# Patient Record
Sex: Female | Born: 1990 | Race: White | Hispanic: No | Marital: Married | Smoking: Current some day smoker
Health system: Southern US, Community
[De-identification: ages and names within clinical notes are randomized; demographics above are authoritative.]

## PROBLEM LIST (undated history)

## (undated) ENCOUNTER — Inpatient Hospital Stay (HOSPITAL_COMMUNITY): Payer: Self-pay

## (undated) DIAGNOSIS — F209 Schizophrenia, unspecified: Secondary | ICD-10-CM

## (undated) DIAGNOSIS — F32A Depression, unspecified: Secondary | ICD-10-CM

## (undated) DIAGNOSIS — E669 Obesity, unspecified: Secondary | ICD-10-CM

## (undated) DIAGNOSIS — F329 Major depressive disorder, single episode, unspecified: Secondary | ICD-10-CM

## (undated) DIAGNOSIS — B373 Candidiasis of vulva and vagina: Secondary | ICD-10-CM

## (undated) DIAGNOSIS — I10 Essential (primary) hypertension: Secondary | ICD-10-CM

## (undated) DIAGNOSIS — F419 Anxiety disorder, unspecified: Secondary | ICD-10-CM

## (undated) DIAGNOSIS — O24419 Gestational diabetes mellitus in pregnancy, unspecified control: Secondary | ICD-10-CM

## (undated) HISTORY — DX: Essential (primary) hypertension: I10

## (undated) HISTORY — DX: Depression, unspecified: F32.A

## (undated) HISTORY — DX: Anxiety disorder, unspecified: F41.9

## (undated) HISTORY — DX: Obesity, unspecified: E66.9

## (undated) HISTORY — PX: TUBAL LIGATION: SHX77

## (undated) HISTORY — DX: Major depressive disorder, single episode, unspecified: F32.9

## (undated) HISTORY — DX: Schizophrenia, unspecified: F20.9

## (undated) HISTORY — PX: WISDOM TOOTH EXTRACTION: SHX21

---
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
## 2016-09-09 ENCOUNTER — Ambulatory Visit (INDEPENDENT_AMBULATORY_CARE_PROVIDER_SITE_OTHER): Payer: Medicaid Other | Admitting: Psychiatry

## 2016-09-09 ENCOUNTER — Encounter (HOSPITAL_COMMUNITY): Payer: Self-pay | Admitting: Psychiatry

## 2016-09-09 VITALS — BP 128/80 | HR 88 | Ht 63.0 in | Wt 255.0 lb

## 2016-09-09 DIAGNOSIS — Z87891 Personal history of nicotine dependence: Secondary | ICD-10-CM | POA: Diagnosis not present

## 2016-09-09 DIAGNOSIS — F411 Generalized anxiety disorder: Secondary | ICD-10-CM

## 2016-09-09 DIAGNOSIS — Z79899 Other long term (current) drug therapy: Secondary | ICD-10-CM

## 2016-09-09 DIAGNOSIS — Z818 Family history of other mental and behavioral disorders: Secondary | ICD-10-CM | POA: Diagnosis not present

## 2016-09-09 DIAGNOSIS — F200 Paranoid schizophrenia: Secondary | ICD-10-CM

## 2016-09-09 MED ORDER — ARIPIPRAZOLE 10 MG PO TABS: 10.0000 mg | ORAL_TABLET | Freq: Every day | ORAL | 2 refills | Status: DC

## 2016-09-09 MED ORDER — CLONAZEPAM 0.5 MG PO TABS: 0.5000 mg | ORAL_TABLET | Freq: Every day | ORAL | 0 refills | Status: DC
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
## 2016-09-15 ENCOUNTER — Telehealth (HOSPITAL_COMMUNITY): Payer: Self-pay | Admitting: *Deleted

## 2016-09-15 NOTE — Telephone Encounter (Signed)
Prior authorization for Aripiprazole received. Called [place] [facility] gave approval #[id number] until 09/10/17. Called to notify pharmacy.

## 2016-09-20 ENCOUNTER — Encounter (HOSPITAL_COMMUNITY): Payer: Self-pay | Admitting: Psychiatry

## 2016-09-20 ENCOUNTER — Telehealth (HOSPITAL_COMMUNITY): Payer: Self-pay | Admitting: Psychiatry

## 2016-09-20 ENCOUNTER — Ambulatory Visit (INDEPENDENT_AMBULATORY_CARE_PROVIDER_SITE_OTHER): Payer: Medicaid Other | Admitting: Psychiatry

## 2016-09-20 VITALS — BP 126/78 | HR 106 | Resp 18 | Ht 63.0 in | Wt 262.0 lb

## 2016-09-20 DIAGNOSIS — F200 Paranoid schizophrenia: Secondary | ICD-10-CM | POA: Diagnosis not present

## 2016-09-20 DIAGNOSIS — Z880 Allergy status to penicillin: Secondary | ICD-10-CM | POA: Diagnosis not present

## 2016-09-20 DIAGNOSIS — F411 Generalized anxiety disorder: Secondary | ICD-10-CM | POA: Diagnosis not present

## 2016-09-20 DIAGNOSIS — Z87891 Personal history of nicotine dependence: Secondary | ICD-10-CM

## 2016-09-20 DIAGNOSIS — Z888 Allergy status to other drugs, medicaments and biological substances status: Secondary | ICD-10-CM

## 2016-09-20 MED ORDER — RISPERIDONE 2 MG PO TABS: 2.0000 mg | ORAL_TABLET | Freq: Two times a day (BID) | ORAL | 0 refills | Status: DC

## 2016-09-20 MED ORDER — CLONAZEPAM 0.5 MG PO TABS: 0.5000 mg | ORAL_TABLET | Freq: Every day | ORAL | 0 refills | Status: DC

## 2016-09-20 NOTE — Telephone Encounter (Signed)
[facility] called and stated that patient just got a rx for klonopin on 10/27. Patient states she had to surrender her medications at [facility] when she was admitted.   We gave patient a rx today.  Verified with Gilmore Larochekhtar, it is ok to fill rx. He is aware.   Nothing further needed at this time.
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
## 2016-09-27 ENCOUNTER — Ambulatory Visit (HOSPITAL_COMMUNITY): Payer: Medicaid Other | Admitting: Psychiatry

## 2016-10-03 ENCOUNTER — Encounter (HOSPITAL_COMMUNITY): Payer: Self-pay | Admitting: Psychiatry

## 2016-10-03 ENCOUNTER — Ambulatory Visit (INDEPENDENT_AMBULATORY_CARE_PROVIDER_SITE_OTHER): Payer: Medicaid Other | Admitting: Psychiatry

## 2016-10-03 VITALS — BP 128/80 | Resp 16 | Ht 63.0 in | Wt 272.0 lb

## 2016-10-03 DIAGNOSIS — F4310 Post-traumatic stress disorder, unspecified: Secondary | ICD-10-CM | POA: Diagnosis not present

## 2016-10-03 DIAGNOSIS — Z87891 Personal history of nicotine dependence: Secondary | ICD-10-CM | POA: Diagnosis not present

## 2016-10-03 DIAGNOSIS — F200 Paranoid schizophrenia: Secondary | ICD-10-CM

## 2016-10-03 DIAGNOSIS — Z79899 Other long term (current) drug therapy: Secondary | ICD-10-CM

## 2016-10-03 DIAGNOSIS — Z888 Allergy status to other drugs, medicaments and biological substances status: Secondary | ICD-10-CM

## 2016-10-03 DIAGNOSIS — F411 Generalized anxiety disorder: Secondary | ICD-10-CM

## 2016-10-03 DIAGNOSIS — Z880 Allergy status to penicillin: Secondary | ICD-10-CM

## 2016-10-03 DIAGNOSIS — Z818 Family history of other mental and behavioral disorders: Secondary | ICD-10-CM

## 2016-10-03 MED ORDER — RISPERIDONE 2 MG PO TABS: 2.0000 mg | ORAL_TABLET | Freq: Two times a day (BID) | ORAL | 0 refills | Status: DC

## 2016-10-03 MED ORDER — CLONAZEPAM 0.5 MG PO TABS: 0.5000 mg | ORAL_TABLET | Freq: Every day | ORAL | 0 refills | Status: DC
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
## 2016-10-05 ENCOUNTER — Ambulatory Visit: Payer: Medicaid Other | Admitting: Osteopathic Medicine

## 2016-10-18 ENCOUNTER — Telehealth (HOSPITAL_COMMUNITY): Payer: Self-pay | Admitting: *Deleted

## 2016-10-18 NOTE — Telephone Encounter (Signed)
Prior authorization for Risperidone received. Called [place] tracks spoke with [facility] who gave approval [id number] until 10/13/17. Called to notify pharmacy.

## 2016-10-24 DIAGNOSIS — F411 Generalized anxiety disorder: Secondary | ICD-10-CM | POA: Insufficient documentation

## 2016-10-24 DIAGNOSIS — F17200 Nicotine dependence, unspecified, uncomplicated: Secondary | ICD-10-CM | POA: Insufficient documentation

## 2016-10-24 DIAGNOSIS — Z8781 Personal history of (healed) traumatic fracture: Secondary | ICD-10-CM | POA: Insufficient documentation

## 2016-10-26 ENCOUNTER — Encounter (HOSPITAL_COMMUNITY): Payer: Self-pay | Admitting: Psychiatry

## 2016-10-26 ENCOUNTER — Ambulatory Visit (INDEPENDENT_AMBULATORY_CARE_PROVIDER_SITE_OTHER): Payer: Medicaid Other | Admitting: Psychiatry

## 2016-10-26 VITALS — BP 128/76 | HR 96 | Resp 16 | Ht 63.0 in | Wt 278.0 lb

## 2016-10-26 DIAGNOSIS — Z79899 Other long term (current) drug therapy: Secondary | ICD-10-CM

## 2016-10-26 DIAGNOSIS — Z818 Family history of other mental and behavioral disorders: Secondary | ICD-10-CM | POA: Diagnosis not present

## 2016-10-26 DIAGNOSIS — F200 Paranoid schizophrenia: Secondary | ICD-10-CM

## 2016-10-26 DIAGNOSIS — Z888 Allergy status to other drugs, medicaments and biological substances status: Secondary | ICD-10-CM

## 2016-10-26 DIAGNOSIS — Z87891 Personal history of nicotine dependence: Secondary | ICD-10-CM

## 2016-10-26 DIAGNOSIS — Z880 Allergy status to penicillin: Secondary | ICD-10-CM | POA: Diagnosis not present

## 2016-10-26 MED ORDER — RISPERIDONE 2 MG PO TABS: 2.0000 mg | ORAL_TABLET | Freq: Two times a day (BID) | ORAL | 0 refills | Status: DC

## 2016-10-26 NOTE — Progress Notes (Signed)
[facility]  Patient Identification: Anna HitchBrandi Jordan MRN:  [id number] Date of Evaluation:  10/26/2016 Referral Source: Self / Family Chief Complaint:   Chief Complaint    Follow-up     Visit Diagnosis:    ICD-9-CM ICD-10-CM   1. Paranoid schizophrenia (HCC) 295.30 F20.0 Prolactin   GAD Insomnia   History of Present Illness:  25 years old currently single Caucasian female who is living with her boyfriend is here for follow up after recent [facility].  Her schizophrenia is reasonably controlled and was told she still feels guarded does not want to be around people but overall less paranoid does not endorse hearing voices She is experiencing milk from her nipples the galactorrhea we will get a prolactin level but she does not want to stop Risperdal because she feels it is really helping  GAD; she remains anxious around people overall less compared to last time Insomnia; Klonopin also at night she takes half a dose as below pulse at night.    Modifying factor is support from boyfriend aggravating factors to be around people or crowds    Aggravating factor: being around people. Miscarriage 2017   Previous Psychotropic Medications: Yes     Past Medical History: History reviewed. No pertinent past medical history. History reviewed. No pertinent surgical history.    Family History:  Family History  Problem Relation Age of Onset  . Depression Mother     Social History:   Social History   Social History  . Marital status: Single    Spouse name: N/A  . Number of children: N/A  . Years of education: N/A   Social History Main Topics  . Smoking status: Former Smoker    Quit date: 08/29/2016  . Smokeless tobacco: Never Used  . Alcohol use No  . Drug use: No  . Sexual activity: Yes    Partners: Male   Other Topics Concern  . None   Social History Narrative  . None     Allergies:   Allergies  Allergen Reactions  .  Hydrocodone Hives  . Morphine Hives  . Tramadol Hives  . Penicillin G Rash    Metabolic Disorder Labs: No results found for: HGBA1C, MPG No results found for: PROLACTIN No results found for: CHOL, TRIG, HDL, CHOLHDL, VLDL, LDLCALC   Current Medications: Current Outpatient Prescriptions  Medication Sig Dispense Refill  . clonazePAM (KLONOPIN) 0.5 MG tablet Take 1 tablet (0.5 mg total) by mouth at bedtime. 30 tablet 0  . nabumetone (RELAFEN) 500 MG tablet Take by mouth.    . risperiDONE (RISPERDAL) 2 MG tablet Take 1 tablet (2 mg total) by mouth 2 (two) times daily. 60 tablet 0   No current facility-administered medications for this visit.       Psychiatric Specialty Exam: Review of Systems  Respiratory: Negative for cough.   Cardiovascular: Negative for chest pain.  Gastrointestinal: Negative for nausea.  Skin: Negative for rash.  Psychiatric/Behavioral: Negative for suicidal ideas. The patient is nervous/anxious.     Blood pressure 128/76, pulse 96, resp. rate 16, height 5\' 3"  (1.6 m), weight 278 lb (126.1 kg), SpO2 94 %.Body mass index is 49.25 kg/m.  General Appearance: Casual  Eye Contact:  Minimal  Speech:  Slow  Volume:  Decreased  Mood:  Less guarded  Affect:  Congruent  Thought Process:  Relavant. Not hallucinating  Orientation:  Full (Time, Place, and Person)  Thought Content:  Denies signifianct paranoia  Suicidal Thoughts:  No  Homicidal Thoughts:  No  Memory:  Immediate;   Fair Recent;   Fair  Judgement:  Poor  Insight:  Shallow  Psychomotor Activity:  Decreased  Concentration:  Concentration: Fair and Attention Span: Fair  Recall:  Fair  Fund of KnowledgeFiserv:Fair  Language: Fair  Akathisia:  Negative  Handed:  Right  AIMS (if indicated):    Assets:  Desire for Improvement  ADL's:  Intact  Cognition: WNL  Sleep:  variable    Treatment Plan Summary: Medication management and Plan as follows  1. Schizophrenia: continue risperdal 2mg  bid. Will  send for prolactin level.  Risks explained on being response she does not undercut down she does have some nipple discharge GAd: on klonopine Insomnia: reveiwed sleep hygiene. klonopine also helps. FU 3-4 weeks or earlier if needed.     Thresa RossAKHTAR, Ixchel Duck, MD 12/13/201711:49 AM

## 2016-10-27 DIAGNOSIS — M5441 Lumbago with sciatica, right side: Secondary | ICD-10-CM

## 2016-10-27 DIAGNOSIS — G8929 Other chronic pain: Secondary | ICD-10-CM | POA: Insufficient documentation

## 2016-10-27 LAB — PROLACTIN: Prolactin: 35.1 ng/mL — ABNORMAL HIGH

## 2016-11-22 ENCOUNTER — Ambulatory Visit (INDEPENDENT_AMBULATORY_CARE_PROVIDER_SITE_OTHER): Payer: Medicaid Other | Admitting: Psychiatry

## 2016-11-22 ENCOUNTER — Ambulatory Visit (HOSPITAL_COMMUNITY): Payer: Medicaid Other | Admitting: Psychiatry

## 2016-11-22 ENCOUNTER — Encounter (HOSPITAL_COMMUNITY): Payer: Self-pay | Admitting: Psychiatry

## 2016-11-22 VITALS — BP 124/70 | HR 100 | Resp 18 | Ht 63.0 in | Wt 274.0 lb

## 2016-11-22 DIAGNOSIS — F411 Generalized anxiety disorder: Secondary | ICD-10-CM

## 2016-11-22 DIAGNOSIS — Z888 Allergy status to other drugs, medicaments and biological substances status: Secondary | ICD-10-CM

## 2016-11-22 DIAGNOSIS — F200 Paranoid schizophrenia: Secondary | ICD-10-CM | POA: Diagnosis not present

## 2016-11-22 DIAGNOSIS — Z79899 Other long term (current) drug therapy: Secondary | ICD-10-CM

## 2016-11-22 DIAGNOSIS — Z880 Allergy status to penicillin: Secondary | ICD-10-CM | POA: Diagnosis not present

## 2016-11-22 DIAGNOSIS — F4310 Post-traumatic stress disorder, unspecified: Secondary | ICD-10-CM

## 2016-11-22 DIAGNOSIS — F17210 Nicotine dependence, cigarettes, uncomplicated: Secondary | ICD-10-CM

## 2016-11-22 MED ORDER — RISPERIDONE 2 MG PO TABS: 2.0000 mg | ORAL_TABLET | Freq: Two times a day (BID) | ORAL | 1 refills | Status: DC

## 2016-11-22 MED ORDER — CLONAZEPAM 0.5 MG PO TABS: 0.5000 mg | ORAL_TABLET | Freq: Every day | ORAL | 0 refills | Status: DC

## 2016-11-22 NOTE — Progress Notes (Signed)
[facility] Outpatient Follow up visit  Patient Identification: Anna Jordan MRN:  [id number] Date of Evaluation:  11/22/2016 Referral Source: Self / Family Chief Complaint:   Chief Complaint    Follow-up     Visit Diagnosis:    ICD-9-CM ICD-10-CM   1. Paranoid schizophrenia (HCC) 295.30 F20.0   2. GAD (generalized anxiety disorder) 300.02 F41.1   3. PTSD (post-traumatic stress disorder) 309.81 F43.10    GAD Insomnia   History of Present Illness:  26 years old currently single Caucasian female who is living with her boyfriend is here for follow up after recent hospital discharge at [facility].  She is coming for an earlier appointment because of schedule adjustment with the ride. She is tolerating Risperdal she does not want to change it some nipple discharge but she is not worried about it she does not want to do a prolactin level again She wants to keep Klonopin for night and anxiety without Klonopin she has difficulty sleeping and gets anxious  Insomnia; Klonopin also at night she takes half a dose as below pulse at night.    Modifying factor is support from boyfriend aggravating factors to be around people or crowds    Aggravating factor: being around people. Miscarriage 2017   Previous Psychotropic Medications: Yes     Past Medical History: History reviewed. No pertinent past medical history. History reviewed. No pertinent surgical history.    Family History:  Family History  Problem Relation Age of Onset  . Depression Mother     Social History:   Social History   Social History  . Marital status: Single    Spouse name: N/A  . Number of children: N/A  . Years of education: N/A   Social History Main Topics  . Smoking status: Current Some Day Smoker    Last attempt to quit: 08/29/2016  . Smokeless tobacco: Never Used  . Alcohol use No  . Drug use: No  . Sexual activity: Yes    Partners: Male   Other Topics Concern  . None   Social History Narrative  .  None     Allergies:   Allergies  Allergen Reactions  . Hydrocodone Hives  . Morphine Hives  . Tramadol Hives  . Penicillin G Rash    Metabolic Disorder Labs: No results found for: HGBA1C, MPG Lab Results  Component Value Date   PROLACTIN 35.1 (H) 10/26/2016   No results found for: CHOL, TRIG, HDL, CHOLHDL, VLDL, LDLCALC   Current Medications: Current Outpatient Prescriptions  Medication Sig Dispense Refill  . clonazePAM (KLONOPIN) 0.5 MG tablet Take 1 tablet (0.5 mg total) by mouth at bedtime. 30 tablet 0  . nabumetone (RELAFEN) 500 MG tablet Take by mouth.    . risperiDONE (RISPERDAL) 2 MG tablet Take 1 tablet (2 mg total) by mouth 2 (two) times daily. 60 tablet 1   No current facility-administered medications for this visit.       Psychiatric Specialty Exam: Review of Systems  Respiratory: Negative for cough.   Cardiovascular: Negative for palpitations.  Gastrointestinal: Negative for nausea.  Skin: Negative for rash.  Psychiatric/Behavioral: Positive for depression. Negative for suicidal ideas. The patient is nervous/anxious.     Blood pressure 124/70, pulse 100, resp. rate 18, height 5\' 3"  (1.6 m), weight 274 lb (124.3 kg), SpO2 96 %.Body mass index is 48.54 kg/m.  General Appearance: Casual  Eye Contact:  Minimal  Speech:  Slow  Volume:  Decreased  Mood:  Guarded but not hopeless.  Affect:  Congruent  Thought Process:  Relavant. Not hallucinating  Orientation:  Full (Time, Place, and Person)  Thought Content:  Denies signifianct paranoia  Suicidal Thoughts:  No  Homicidal Thoughts:  No  Memory:  Immediate;   Fair Recent;   Fair  Judgement:  Poor  Insight:  Shallow  Psychomotor Activity:  Decreased  Concentration:  Concentration: Fair and Attention Span: Fair  Recall:  [employer]Fair  Fund of Knowledge:Fair  Language: Fair  Akathisia:  Negative  Handed:  Right  AIMS (if indicated):    Assets:  Desire for Improvement  ADL's:  Intact  Cognition: WNL   Sleep:  variable    Treatment Plan Summary: Medication management and Plan as follows  No treatment change done and will continue last visit plan.  1. Schizophrenia: continue risperdal 2mg  bid. .  Risks explained on being on risperdal and somewhat high prolactin level.  GAd: on klonopine Insomnia: reveiwed sleep hygiene. klonopine also helps. FU 2-3 months or earelir if needed.      Thresa RossAKHTAR, Jarvis Knodel, MD 1/9/20189:24 AM

## 2016-12-29 ENCOUNTER — Telehealth (HOSPITAL_COMMUNITY): Payer: Self-pay | Admitting: Psychiatry

## 2016-12-29 NOTE — Telephone Encounter (Signed)
Need refill on  Klonopin and risperdal sent [facility] in [place]

## 2017-01-02 ENCOUNTER — Telehealth (HOSPITAL_COMMUNITY): Payer: Self-pay | Admitting: Psychiatry

## 2017-01-02 NOTE — Telephone Encounter (Signed)
Pt will need a prescription written for klonopin. Prescription was last written on 11/22/16. Please call pt when prescription is ready for pickup.

## 2017-01-03 NOTE — Telephone Encounter (Signed)
klonopine printed for pickup 

## 2017-01-13 ENCOUNTER — Encounter (HOSPITAL_COMMUNITY): Payer: Self-pay | Admitting: Psychiatry

## 2017-01-13 ENCOUNTER — Ambulatory Visit (INDEPENDENT_AMBULATORY_CARE_PROVIDER_SITE_OTHER): Payer: Medicaid Other | Admitting: Psychiatry

## 2017-01-13 VITALS — BP 124/70 | HR 95 | Resp 16 | Ht 63.0 in | Wt 286.0 lb

## 2017-01-13 DIAGNOSIS — Z888 Allergy status to other drugs, medicaments and biological substances status: Secondary | ICD-10-CM

## 2017-01-13 DIAGNOSIS — F411 Generalized anxiety disorder: Secondary | ICD-10-CM

## 2017-01-13 DIAGNOSIS — Z79899 Other long term (current) drug therapy: Secondary | ICD-10-CM

## 2017-01-13 DIAGNOSIS — Z880 Allergy status to penicillin: Secondary | ICD-10-CM

## 2017-01-13 DIAGNOSIS — Z818 Family history of other mental and behavioral disorders: Secondary | ICD-10-CM | POA: Diagnosis not present

## 2017-01-13 DIAGNOSIS — F17210 Nicotine dependence, cigarettes, uncomplicated: Secondary | ICD-10-CM

## 2017-01-13 DIAGNOSIS — F4310 Post-traumatic stress disorder, unspecified: Secondary | ICD-10-CM

## 2017-01-13 DIAGNOSIS — G4700 Insomnia, unspecified: Secondary | ICD-10-CM | POA: Diagnosis not present

## 2017-01-13 DIAGNOSIS — F200 Paranoid schizophrenia: Secondary | ICD-10-CM | POA: Diagnosis not present

## 2017-01-13 MED ORDER — CLONAZEPAM 0.5 MG PO TABS: 0.5000 mg | ORAL_TABLET | Freq: Every day | ORAL | 0 refills | Status: DC

## 2017-01-13 MED ORDER — ARIPIPRAZOLE 10 MG PO TABS: 10.0000 mg | ORAL_TABLET | Freq: Every day | ORAL | 0 refills | Status: DC

## 2017-01-13 NOTE — Progress Notes (Signed)
[facility] Outpatient Follow up visit  Patient Identification: Anna Jordan MRN:  [id number] Date of Evaluation:  01/13/2017 Referral Source: Self / Family Chief Complaint:   Chief Complaint    Follow-up     Visit Diagnosis:    ICD-9-CM ICD-10-CM   1. Paranoid schizophrenia (HCC) 295.30 F20.0   2. GAD (generalized anxiety disorder) 300.02 F41.1   3. PTSD (post-traumatic stress disorder) 309.81 F43.10    GAD Insomnia   History of Present Illness:  26 years old currently single Caucasian female who is living with her boyfriend is here for follow up after recent hospital discharge at [facility].  Patient stopped taking Risperdal because of concern of not having regular periods she's been off work for the last 1-1/2 month now she is back and having. Last week. She wants to try something different as started on Abilify in the past but then it was changed not given enough time as she got admitted around the same time.  She is taking Klonopin regularly but for the last 1 week she has not taken but she ran out. She does endorse paranoia around people or when she goes out in public places she gets very uncomfortable but at home she does okay she has a son back she has a boyfriend  No otherwise manic symptoms. She does worry she thinks about the past at times that keeps her distracted but overall she is trying to focus on spending more time with the baby   Insomnia; Klonopin also at night she takes half a dose as below pulse at night.    Modifying factor is support from boyfriend aggravating factors to be around people or crowds    Aggravating factor: being around people. Miscarriage 2017   Previous Psychotropic Medications: Yes     Past Medical History: History reviewed. No pertinent past medical history. History reviewed. No pertinent surgical history.    Family History:  Family History  Problem Relation Age of Onset  . Depression Mother     Social History:   Social History    Social History  . Marital status: Single    Spouse name: N/A  . Number of children: N/A  . Years of education: N/A   Social History Main Topics  . Smoking status: Current Every Day Smoker    Packs/day: 0.25    Types: Cigarettes  . Smokeless tobacco: Never Used     Comment: reduce # the cigarettes   . Alcohol use No  . Drug use: No  . Sexual activity: Yes    Partners: Male   Other Topics Concern  . None   Social History Narrative  . None     Allergies:   Allergies  Allergen Reactions  . Hydrocodone Hives  . Morphine Hives  . Tramadol Hives  . Penicillin G Rash    Metabolic Disorder Labs: No results found for: HGBA1C, MPG Lab Results  Component Value Date   PROLACTIN 35.1 (H) 10/26/2016   No results found for: CHOL, TRIG, HDL, CHOLHDL, VLDL, LDLCALC   Current Medications: Current Outpatient Prescriptions  Medication Sig Dispense Refill  . nabumetone (RELAFEN) 500 MG tablet Take by mouth.    . ARIPiprazole (ABILIFY) 10 MG tablet Take 1 tablet (10 mg total) by mouth daily. 30 tablet 0  . clonazePAM (KLONOPIN) 0.5 MG tablet Take 1 tablet (0.5 mg total) by mouth at bedtime. 30 tablet 0   No current facility-administered medications for this visit.       Psychiatric Specialty Exam: Review  of Systems  Respiratory: Negative for cough.   Cardiovascular: Negative for chest pain.  Gastrointestinal: Negative for nausea.  Skin: Negative for rash.  Psychiatric/Behavioral: Positive for depression. Negative for substance abuse and suicidal ideas.    Blood pressure 124/70, pulse 95, resp. rate 16, height 5\' 3"  (1.6 m), weight 286 lb (129.7 kg), last menstrual period 01/07/2017, SpO2 97 %.Body mass index is 50.66 kg/m.  General Appearance: Casual  Eye Contact:  Minimal  Speech:  Slow  Volume:  Decreased  Mood:guarded  Affect:  Congruent  Thought Process:  Relavant. Not hallucinating  Orientation:  Full (Time, Place, and Person)  Thought Content:  Denies  signifianct paranoia  Suicidal Thoughts:  No  Homicidal Thoughts:  No  Memory:  Immediate;   Fair Recent;   Fair  Judgement:  Poor  Insight:  Shallow  Psychomotor Activity:  Decreased  Concentration:  Concentration: Fair and Attention Span: Fair  Recall:  [employer]ervFair  Fund of Knowledge:Fair  Language: Fair  Akathisia:  Negative  Handed:  Right  AIMS (if indicated):    Assets:  Desire for Improvement  ADL's:  Intact  Cognition: WNL  Sleep:  variable    Treatment Plan Summary: Medication management and Plan as follows   1. Schizophrenia: endorses paranoia. Not on risperdal. Will start abilify 5mg  increase to 10mg  in 4 days  GAd: worsened. Restart klonopine at night.  Insomnia: reviewed sleep hygiene.  FU 3-4 weeks or earlier if needed. Side effects reivewed and questions were addressed.      Thresa RossAKHTAR, Madolyn Ackroyd, MD 3/2/20189:07 AM

## 2017-02-05 ENCOUNTER — Other Ambulatory Visit (HOSPITAL_COMMUNITY): Payer: Self-pay | Admitting: Psychiatry

## 2017-02-07 NOTE — Telephone Encounter (Signed)
[facility]. Per Dr. Gilmore Larocheakhtar, refill request is denied. Abilify rx was sent to pharmacy on 01/13/17. Pt has request refill too early. Pt is schedule for an apt on 02/08/17. Nothing further is needed at this time.

## 2017-02-08 ENCOUNTER — Encounter (HOSPITAL_COMMUNITY): Payer: Self-pay | Admitting: Psychiatry

## 2017-02-08 ENCOUNTER — Ambulatory Visit (INDEPENDENT_AMBULATORY_CARE_PROVIDER_SITE_OTHER): Payer: Medicaid Other | Admitting: Psychiatry

## 2017-02-08 DIAGNOSIS — F4310 Post-traumatic stress disorder, unspecified: Secondary | ICD-10-CM

## 2017-02-08 DIAGNOSIS — F17210 Nicotine dependence, cigarettes, uncomplicated: Secondary | ICD-10-CM | POA: Diagnosis not present

## 2017-02-08 DIAGNOSIS — F200 Paranoid schizophrenia: Secondary | ICD-10-CM

## 2017-02-08 DIAGNOSIS — G4700 Insomnia, unspecified: Secondary | ICD-10-CM

## 2017-02-08 DIAGNOSIS — Z818 Family history of other mental and behavioral disorders: Secondary | ICD-10-CM

## 2017-02-08 DIAGNOSIS — Z79899 Other long term (current) drug therapy: Secondary | ICD-10-CM

## 2017-02-08 DIAGNOSIS — F411 Generalized anxiety disorder: Secondary | ICD-10-CM | POA: Diagnosis not present

## 2017-02-08 MED ORDER — CLONAZEPAM 0.5 MG PO TABS: 0.5000 mg | ORAL_TABLET | Freq: Every day | ORAL | 1 refills | Status: DC

## 2017-02-08 MED ORDER — ARIPIPRAZOLE 10 MG PO TABS: 10.0000 mg | ORAL_TABLET | Freq: Every day | ORAL | 1 refills | Status: DC

## 2017-02-08 NOTE — Progress Notes (Signed)
[facility] Outpatient Follow up visit  Patient Identification: Anna HitchBrandi Jordan MRN:  [id number] Date of Evaluation:  02/08/2017 Referral Source: Self / Family Chief Complaint:    Visit Diagnosis:    ICD-9-CM ICD-10-CM   1. Paranoid schizophrenia (HCC) 295.30 F20.0   2. GAD (generalized anxiety disorder) 300.02 F41.1   3. PTSD (post-traumatic stress disorder) 309.81 F43.10    GAD Insomnia   History of Present Illness:  26 years old currently single Caucasian female who is living with her boyfriend is here for follow up after recent hospital discharge [facility].  Patient was having gynecomastia with this but also he stopped that and started Abilify she is responding better she feels it has helped her depression as well less paranoid. This was the first that she have driven after long time and she came to the office driving herself. Does not hallucinate as of now. Anxiety is improved.  No otherwise manic symptoms.  No worsening of PTSD. Triggers do remind her with infrequent flashbacks and avoids crowds  Insomnia; Klonopin also at night she takes half a dose as below pulse at night.    Modifying factor boyfriend support    Aggravating factor: crowds. Miscarriage 2017   Previous Psychotropic Medications: Yes     Past Medical History: History reviewed. No pertinent past medical history. History reviewed. No pertinent surgical history.    Family History:  Family History  Problem Relation Age of Onset  . Depression Mother     Social History:   Social History   Social History  . Marital status: Single    Spouse name: N/A  . Number of children: N/A  . Years of education: N/A   Social History Main Topics  . Smoking status: Current Every Day Smoker    Packs/day: 0.25    Types: Cigarettes  . Smokeless tobacco: Never Used     Comment: reduce # the cigarettes   . Alcohol use No  . Drug use: No  . Sexual activity: Yes    Partners: Male   Other Topics Concern  . None    Social History Narrative  . None     Allergies:   Allergies  Allergen Reactions  . Hydrocodone Hives  . Morphine Hives  . Tramadol Hives  . Penicillin G Rash    Metabolic Disorder Labs: No results found for: HGBA1C, MPG Lab Results  Component Value Date   PROLACTIN 35.1 (H) 10/26/2016   No results found for: CHOL, TRIG, HDL, CHOLHDL, VLDL, LDLCALC   Current Medications: Current Outpatient Prescriptions  Medication Sig Dispense Refill  . ARIPiprazole (ABILIFY) 10 MG tablet Take 1 tablet (10 mg total) by mouth daily. 30 tablet 1  . clonazePAM (KLONOPIN) 0.5 MG tablet Take 1 tablet (0.5 mg total) by mouth at bedtime. 30 tablet 1  . nabumetone (RELAFEN) 500 MG tablet Take by mouth.     No current facility-administered medications for this visit.       Psychiatric Specialty Exam: Review of Systems  Respiratory: Negative for cough.   Cardiovascular: Negative for palpitations.  Gastrointestinal: Negative for nausea.  Skin: Negative for rash.  Psychiatric/Behavioral: Negative for substance abuse and suicidal ideas.  weight: 289 lbs  There were no vitals taken for this visit.There is no height or weight on file to calculate BMI.  General Appearance: Casual  Eye Contact:  Minimal  Speech:  Slow  Volume:  Decreased  Mood: better. improved  Affect:  Did smile today  Thought Process:  Relavant. Not hallucinating  Orientation:  Full (Time, Place, and Person)  Thought Content:  Intact and not loose  Suicidal Thoughts:  No  Homicidal Thoughts:  No  Memory:  Immediate;   Fair Recent;   Fair  Judgement:  Poor  Insight:  Shallow  Psychomotor Activity:  Decreased  Concentration:  Concentration: Fair and Attention Span: Fair  Recall:  [employer] of Knowledge:Fair  Language: Fair  Akathisia:  Negative  Handed:  Right  AIMS (if indicated):    Assets:  Boy friend. support  ADL's:  Intact  Cognition: WNL  Sleep:  variable    Treatment Plan Summary: Medication  management and Plan as follows   1. Schizophrenia: endorses paranoia. Denies paranoia. Tolerating abilify continue 10mg . Reports no galactorrhea No tremors   GAd: baseline. Continue klonopine Insomnia: reviewed sleep hygiene. Continue klonopine at night that helps Reviewed  side effects and provided supportive therapy FU2 months or earlier if needed      Thresa Ross, MD 3/28/201811:47 AM

## 2017-02-21 ENCOUNTER — Ambulatory Visit (HOSPITAL_COMMUNITY): Payer: Medicaid Other | Admitting: Psychiatry

## 2017-03-14 ENCOUNTER — Encounter: Payer: Self-pay | Admitting: Obstetrics & Gynecology

## 2017-03-14 ENCOUNTER — Ambulatory Visit (INDEPENDENT_AMBULATORY_CARE_PROVIDER_SITE_OTHER): Payer: Medicaid Other | Admitting: Obstetrics & Gynecology

## 2017-03-14 ENCOUNTER — Other Ambulatory Visit: Payer: Self-pay | Admitting: Obstetrics & Gynecology

## 2017-03-14 ENCOUNTER — Other Ambulatory Visit (HOSPITAL_COMMUNITY)
Admission: RE | Admit: 2017-03-14 | Discharge: 2017-03-14 | Disposition: A | Discharge status: Home / Self Care | Payer: Medicaid Other | Source: Ambulatory Visit | Attending: Obstetrics & Gynecology | Admitting: Obstetrics & Gynecology

## 2017-03-14 DIAGNOSIS — O99210 Obesity complicating pregnancy, unspecified trimester: Secondary | ICD-10-CM | POA: Diagnosis present

## 2017-03-14 DIAGNOSIS — E669 Obesity, unspecified: Secondary | ICD-10-CM | POA: Diagnosis not present

## 2017-03-14 DIAGNOSIS — Z3689 Encounter for other specified antenatal screening: Secondary | ICD-10-CM

## 2017-03-14 DIAGNOSIS — O10911 Unspecified pre-existing hypertension complicating pregnancy, first trimester: Secondary | ICD-10-CM | POA: Diagnosis not present

## 2017-03-14 DIAGNOSIS — O99211 Obesity complicating pregnancy, first trimester: Secondary | ICD-10-CM | POA: Diagnosis not present

## 2017-03-14 DIAGNOSIS — O09299 Supervision of pregnancy with other poor reproductive or obstetric history, unspecified trimester: Secondary | ICD-10-CM | POA: Insufficient documentation

## 2017-03-14 DIAGNOSIS — O09291 Supervision of pregnancy with other poor reproductive or obstetric history, first trimester: Secondary | ICD-10-CM

## 2017-03-14 DIAGNOSIS — O99331 Smoking (tobacco) complicating pregnancy, first trimester: Secondary | ICD-10-CM | POA: Diagnosis not present

## 2017-03-14 MED ORDER — PROMETHAZINE HCL 25 MG PO TABS: 25.0000 mg | ORAL_TABLET | Freq: Four times a day (QID) | ORAL | 2 refills | Status: DC | PRN

## 2017-03-14 MED ORDER — DOXYLAMINE-PYRIDOXINE 10-10 MG PO TBEC: 2.0000 | DELAYED_RELEASE_TABLET | Freq: Every day | ORAL | 2 refills | Status: DC

## 2017-03-14 NOTE — Progress Notes (Signed)
Pt here for NOB intake.  Her dates and LMP do not match.  CRL is 15.4 mm and GA is [redacted]w[redacted]d  FHT is 166 BPM

## 2017-03-14 NOTE — Progress Notes (Signed)
Subjective:    Anna Jordan is a [id number] [redacted]w[redacted]d being seen today for her first obstetrical visit.  Her obstetrical history is significant for excessive weight gain, macrosomia, obesity and smoker. Pregnancy history fully reviewed.  Patient reports nausea.  Vitals:   03/14/17 1411  BP: 118/78  Pulse: 84  Weight: 296 lb (134.3 kg)    HISTORY: OB History  Gravida Para Term Preterm AB Living  SAB TAB Ectopic Multiple Live Births  1       1    # Outcome Date GA Lbr Len/2nd Weight Sex Delivery Anes PTL Lv  4 Current           3 SAB           2 Term           1 Term              Past Medical History:  Diagnosis Date  . Hypertension   . Obesity   . Schizophrenia [facility])    Past Surgical History:  Procedure Laterality Date  . WISDOM TOOTH EXTRACTION     Family History  Problem Relation Age of Onset  . Depression Mother   . Diabetes Mother   . Heart disease Mother   . Stroke Mother   . Heart attack Father   . Breast cancer Maternal Aunt     4 mat aunts with breast cancer     Exam    Uterus:     Pelvic Exam:    Perineum: Hemorrhoids   Vulva: normal   Vagina:  normal mucosa   pH: n/a   Cervix: no bleeding following Pap and no cervical motion tenderness   Adnexa: normal adnexa   Bony Pelvis: gynecoid  System: Breast:  normal appearance, no masses or tenderness   Skin: normal coloration and turgor, no rashes    Neurologic: oriented, normal mood   Extremities: no deformities   HEENT extra ocular movement intact, sclera clear, anicteric, oropharynx clear, no lesions, neck supple with midline trachea, thyroid without masses and trachea midline   Mouth/Teeth mucous membranes moist, pharynx normal without lesions   Neck supple and no masses   Cardiovascular: regular rate and rhythm   Respiratory:  appears well, vitals normal, no respiratory distress, acyanotic, normal RR, chest clear, no wheezing, crepitations, rhonchi, normal symmetric air entry   Abdomen: soft, non-tender; bowel sounds normal; no masses,  no organomegaly   Urinary: urethral meatus normal      Assessment:    Pregnancy: [id number] Patient Active Problem List   Diagnosis Date Noted  . Obesity in pregnancy 03/14/2017  . History of macrosomia in infant in prior pregnancy, currently pregnant 03/14/2017  . Chronic right-sided low back pain with right-sided sciatica 10/27/2016  . Generalized anxiety disorder 10/24/2016  . History of vertebral compression fracture 10/24/2016  . Tobacco use disorder 10/24/2016        Plan:     Initial labs drawn. Prenatal vitamins. Problem list reviewed and updated. Genetic Screening discussed First Screen: ordered.  Ultrasound discussed; fetal survey: requested.  1-History of gestational DM--early glucola today  2-Macrosomia--Get delivery note.  Pt says it was a difficult delivery (pushed 9 hours)  3-CHTN--baseline labs, ASA at 13 weeks  4-TSH  5-Total weight gain to be 12 pounds  6-N/V-Stop Zofran until 10 weeks; diclegis and phenergan  7-Psych--continue care with psychiatrist  8-Advised to quit smoking  9-Babyscirpts app only   Follow up in  4 weeks. 50% of 45 min visit spent on counseling and coordination of care.   Elsie Lincoln 03/14/2017

## 2017-03-15 LAB — PRENATAL PROFILE (SOLSTAS)
ANTIBODY SCREEN: NEGATIVE
BASOS ABS: 0 {cells}/uL (ref 0–200)
Basophils Relative: 0 %
EOS ABS: 228 {cells}/uL (ref 15–500)
Eosinophils Relative: 2 %
HCT: 39.9 % (ref 35.0–45.0)
HIV: NONREACTIVE
Hemoglobin: 12.9 g/dL (ref 11.7–15.5)
Hepatitis B Surface Ag: NEGATIVE
LYMPHS PCT: 21 %
Lymphs Abs: 2394 cells/uL (ref 850–3900)
MCH: 29.3 pg (ref 27.0–33.0)
MCHC: 32.3 g/dL (ref 32.0–36.0)
MCV: 90.5 fL (ref 80.0–100.0)
MONOS PCT: 3 %
MPV: 9.8 fL (ref 7.5–12.5)
Monocytes Absolute: 342 cells/uL (ref 200–950)
NEUTROS PCT: 74 %
Neutro Abs: 8436 cells/uL — ABNORMAL HIGH (ref 1500–7800)
PLATELETS: 249 10*3/uL (ref 140–400)
RBC: 4.41 MIL/uL (ref 3.80–5.10)
RDW: 14.9 % (ref 11.0–15.0)
RH TYPE: POSITIVE
WBC: 11.4 10*3/uL — ABNORMAL HIGH (ref 3.8–10.8)

## 2017-03-15 LAB — CMP 10231
AG Ratio: 1.3 Ratio (ref 1.0–2.5)
ALT: 21 U/L (ref 6–29)
AST: 19 U/L (ref 10–30)
Albumin: 3.5 g/dL — ABNORMAL LOW (ref 3.6–5.1)
Alkaline Phosphatase: 49 U/L (ref 33–115)
BUN / CREAT RATIO: 9.1 ratio (ref 6–22)
BUN: 6 mg/dL — ABNORMAL LOW (ref 7–25)
CHLORIDE: 103 mmol/L (ref 98–110)
CO2: 23 mmol/L (ref 20–31)
CREATININE: 0.66 mg/dL (ref 0.50–1.10)
Calcium: 8.5 mg/dL — ABNORMAL LOW (ref 8.6–10.2)
Globulin: 2.8 g/dL (ref 1.9–3.7)
Glucose, Bld: 214 mg/dL — ABNORMAL HIGH (ref 65–99)
POTASSIUM: 3.7 mmol/L (ref 3.5–5.3)
SODIUM: 136 mmol/L (ref 135–146)
Total Bilirubin: 0.2 mg/dL (ref 0.2–1.2)
Total Protein: 6.3 g/dL (ref 6.1–8.1)

## 2017-03-15 LAB — PROTEIN / CREATININE RATIO, URINE
CREATININE, URINE: 193 mg/dL (ref 20–320)
Protein Creatinine Ratio: 73 mg/g creat (ref 21–161)
TOTAL PROTEIN, URINE: 14 mg/dL (ref 5–24)

## 2017-03-15 LAB — TSH: TSH: 3 mIU/L

## 2017-03-15 LAB — GLUCOSE TOLERANCE, 1 HOUR (50G) W/O FASTING: GLUCOSE, 1 HR, GESTATIONAL: 208 mg/dL — AB (ref <140)

## 2017-03-16 LAB — CULTURE, OB URINE
Colony Count: NO GROWTH
Organism ID, Bacteria: NO GROWTH

## 2017-03-17 ENCOUNTER — Telehealth: Payer: Self-pay | Admitting: *Deleted

## 2017-03-17 DIAGNOSIS — O24414 Gestational diabetes mellitus in pregnancy, insulin controlled: Secondary | ICD-10-CM | POA: Insufficient documentation

## 2017-03-17 DIAGNOSIS — O24419 Gestational diabetes mellitus in pregnancy, unspecified control: Secondary | ICD-10-CM

## 2017-03-17 LAB — CYTOLOGY - PAP
Chlamydia: NEGATIVE
Diagnosis: NEGATIVE
NEISSERIA GONORRHEA: NEGATIVE

## 2017-03-17 NOTE — Telephone Encounter (Signed)
Called pt's boyfriend since he is the only number avaiable to let her know that she does have gestational diabetes.  An order was placed in the chart for a referral to N [facility] D.  Sp[facility] with Anna [facility] who will schedule Anna Jordan and appt.

## 2017-03-21 ENCOUNTER — Other Ambulatory Visit: Payer: Self-pay | Admitting: Obstetrics & Gynecology

## 2017-03-21 DIAGNOSIS — O24410 Gestational diabetes mellitus in pregnancy, diet controlled: Secondary | ICD-10-CM

## 2017-03-21 NOTE — Progress Notes (Signed)
Pt needs Hgb A1c, CMP, Protein/creat ratio I naddition to nutrition and DM mgmt

## 2017-04-07 ENCOUNTER — Ambulatory Visit (HOSPITAL_COMMUNITY): Payer: Medicaid Other | Admitting: Psychiatry

## 2017-04-14 ENCOUNTER — Encounter (HOSPITAL_COMMUNITY): Payer: Self-pay

## 2017-04-14 ENCOUNTER — Ambulatory Visit (HOSPITAL_COMMUNITY)
Admission: RE | Admit: 2017-04-14 | Discharge: 2017-04-14 | Disposition: A | Discharge status: Home / Self Care | Payer: Medicaid Other | Source: Ambulatory Visit | Attending: Obstetrics & Gynecology | Admitting: Obstetrics & Gynecology

## 2017-04-14 DIAGNOSIS — O24410 Gestational diabetes mellitus in pregnancy, diet controlled: Secondary | ICD-10-CM | POA: Diagnosis not present

## 2017-04-14 DIAGNOSIS — O99332 Smoking (tobacco) complicating pregnancy, second trimester: Secondary | ICD-10-CM | POA: Diagnosis not present

## 2017-04-14 DIAGNOSIS — Z6841 Body Mass Index (BMI) 40.0 and over, adult: Secondary | ICD-10-CM | POA: Insufficient documentation

## 2017-04-14 DIAGNOSIS — O09299 Supervision of pregnancy with other poor reproductive or obstetric history, unspecified trimester: Secondary | ICD-10-CM | POA: Insufficient documentation

## 2017-04-14 DIAGNOSIS — O99210 Obesity complicating pregnancy, unspecified trimester: Secondary | ICD-10-CM | POA: Diagnosis present

## 2017-04-14 DIAGNOSIS — O10012 Pre-existing essential hypertension complicating pregnancy, second trimester: Secondary | ICD-10-CM | POA: Diagnosis not present

## 2017-04-14 DIAGNOSIS — E669 Obesity, unspecified: Secondary | ICD-10-CM | POA: Diagnosis not present

## 2017-04-14 DIAGNOSIS — Z3682 Encounter for antenatal screening for nuchal translucency: Secondary | ICD-10-CM | POA: Insufficient documentation

## 2017-04-14 DIAGNOSIS — Z3A13 13 weeks gestation of pregnancy: Secondary | ICD-10-CM | POA: Insufficient documentation

## 2017-04-14 HISTORY — DX: Gestational diabetes mellitus in pregnancy, unspecified control: O24.419

## 2017-04-18 ENCOUNTER — Encounter (HOSPITAL_COMMUNITY): Payer: Self-pay | Admitting: Psychiatry

## 2017-04-18 ENCOUNTER — Ambulatory Visit (INDEPENDENT_AMBULATORY_CARE_PROVIDER_SITE_OTHER): Payer: Medicaid Other | Admitting: Psychiatry

## 2017-04-18 ENCOUNTER — Ambulatory Visit (INDEPENDENT_AMBULATORY_CARE_PROVIDER_SITE_OTHER): Payer: Medicaid Other | Admitting: Obstetrics & Gynecology

## 2017-04-18 VITALS — BP 153/87 | HR 114 | Wt 296.0 lb

## 2017-04-18 DIAGNOSIS — F209 Schizophrenia, unspecified: Secondary | ICD-10-CM | POA: Diagnosis not present

## 2017-04-18 DIAGNOSIS — O24419 Gestational diabetes mellitus in pregnancy, unspecified control: Secondary | ICD-10-CM

## 2017-04-18 DIAGNOSIS — Z818 Family history of other mental and behavioral disorders: Secondary | ICD-10-CM | POA: Diagnosis not present

## 2017-04-18 DIAGNOSIS — O99210 Obesity complicating pregnancy, unspecified trimester: Secondary | ICD-10-CM

## 2017-04-18 DIAGNOSIS — F4310 Post-traumatic stress disorder, unspecified: Secondary | ICD-10-CM

## 2017-04-18 DIAGNOSIS — F411 Generalized anxiety disorder: Secondary | ICD-10-CM | POA: Diagnosis not present

## 2017-04-18 DIAGNOSIS — Z87891 Personal history of nicotine dependence: Secondary | ICD-10-CM | POA: Diagnosis not present

## 2017-04-18 DIAGNOSIS — O99212 Obesity complicating pregnancy, second trimester: Secondary | ICD-10-CM

## 2017-04-18 DIAGNOSIS — O162 Unspecified maternal hypertension, second trimester: Secondary | ICD-10-CM

## 2017-04-18 DIAGNOSIS — F200 Paranoid schizophrenia: Secondary | ICD-10-CM

## 2017-04-18 MED ORDER — ARIPIPRAZOLE 5 MG PO TABS: 5.0000 mg | ORAL_TABLET | Freq: Every day | ORAL | 1 refills | Status: DC

## 2017-04-18 MED ORDER — LABETALOL HCL 200 MG PO TABS: 400.0000 mg | ORAL_TABLET | Freq: Two times a day (BID) | ORAL | 3 refills | Status: DC

## 2017-04-18 MED ORDER — ACCU-CHEK NANO SMARTVIEW W/DEVICE KIT: 1.0000 | PACK | 0 refills | Status: DC

## 2017-04-18 MED ORDER — GLUCOSE BLOOD VI STRP: ORAL_STRIP | 12 refills | Status: DC

## 2017-04-18 MED ORDER — ACCU-CHEK FASTCLIX LANCETS MISC: 1.0000 [IU] | Freq: Four times a day (QID) | 12 refills | Status: DC

## 2017-04-18 MED ORDER — INSULIN NPH (HUMAN) (ISOPHANE) 100 UNIT/ML ~~LOC~~ SUSP: SUBCUTANEOUS | 3 refills | Status: DC

## 2017-04-18 MED ORDER — INSULIN STARTER KIT- SYRINGES (ENGLISH): 1.0000 | Freq: Once | Status: DC

## 2017-04-18 NOTE — Progress Notes (Signed)
[facility] Outpatient Follow up visit  Patient Identification: Anna Jordan MRN:  [id number] Date of Evaluation:  04/18/2017 Referral Source: Self / Family Chief Complaint:   Chief Complaint    Follow-up     Visit Diagnosis:    ICD-9-CM ICD-10-CM   1. Paranoid schizophrenia (HCC) 295.30 F20.0   2. GAD (generalized anxiety disorder) 300.02 F41.1   3. PTSD (post-traumatic stress disorder) 309.81 F43.10    GAD Insomnia   History of Present Illness:  26 years old currently single Caucasian female who is living with her boyfriend is here for follow up.  Pregnant [redacted] weeks.  She has continued taking Abilify. She understands is category C. She ran low  also have not been on for the last 4 days. OBGYn aware of her abilify and meds. Not on klonopine   Denies paranoia she is excited although pregnancy was unplanned Denies hallucinations. No manic symptoms. She does have a supportive boyfriend Insomnia; baseline not on Klonopin as of now She has appointment with obgyn today and will get her regular physical exam there There is history of miscarriage. She needs to follow closely with obgyn   Modifying factor : boyfriend    Aggravating factor: crowds. Miscarriage 2017   Previous Psychotropic Medications: Yes     Past Medical History:  Past Medical History:  Diagnosis Date  . Gestational diabetes   . Hypertension   . Obesity   . Schizophrenia [facility])     Past Surgical History:  Procedure Laterality Date  . WISDOM TOOTH EXTRACTION        Family History:  Family History  Problem Relation Age of Onset  . Depression Mother   . Diabetes Mother   . Heart disease Mother   . Stroke Mother   . Heart attack Father   . Breast cancer Maternal Aunt        4 mat aunts with breast cancer    Social History:   Social History   Social History  . Marital status: Legally Separated    Spouse name: N/A  . Number of children: N/A  . Years of education: N/A   Occupational History   . homemaker    Social History Main Topics  . Smoking status: Former Smoker    Packs/day: 0.25    Types: Cigarettes    Quit date: 03/31/2017  . Smokeless tobacco: Never Used     Comment: reduce # the cigarettes   . Alcohol use No  . Drug use: No  . Sexual activity: Yes    Partners: Male    Birth control/ protection: None   Other Topics Concern  . None   Social History Narrative  . None     Allergies:   Allergies  Allergen Reactions  . Hydrocodone Hives  . Morphine Hives  . Tramadol Hives  . Penicillin G Rash    Metabolic Disorder Labs: No results found for: HGBA1C, MPG Lab Results  Component Value Date   PROLACTIN 35.1 (H) 10/26/2016   No results found for: CHOL, TRIG, HDL, CHOLHDL, VLDL, LDLCALC   Current Medications: Current Outpatient Prescriptions  Medication Sig Dispense Refill  . ARIPiprazole (ABILIFY) 5 MG tablet Take 1 tablet (5 mg total) by mouth daily. 30 tablet 1  . Doxylamine-Pyridoxine (DICLEGIS) 10-10 MG TBEC Take 2 tablets by mouth at bedtime. 60 tablet 2  . ondansetron (ZOFRAN-ODT) 4 MG disintegrating tablet DISSOLVE  1 T PO Q 8 H PRF NAUSEA  0  . PROAIR HFA 108 (90 Base) MCG/ACT inhaler  INHALE 2 PUFFS PO INTO THE LUNGS Q 4 H PRF WHEEZING  0  . promethazine (PHENERGAN) 25 MG tablet Take 1 tablet (25 mg total) by mouth every 6 (six) hours as needed for nausea or vomiting. 30 tablet 2  . ranitidine (ZANTAC) 150 MG tablet Take by mouth.     No current facility-administered medications for this visit.       Psychiatric Specialty Exam: Review of Systems  Respiratory: Negative for cough.   Cardiovascular: Negative for chest pain.  Gastrointestinal: Negative for nausea.  Skin: Negative for rash.  Psychiatric/Behavioral: Negative for depression, substance abuse and suicidal ideas.  weight: 289 lbs  Last menstrual period 12/25/2016.There is no height or weight on file to calculate BMI.  General Appearance: Casual  Eye Contact:  Minimal   Speech:  Slow  Volume:  Decreased  Mood: bright  Affect: reactive  Thought Process:  Relavant. Not hallucinating  Orientation:  Full (Time, Place, and Person)  Thought Content:  Intact and not loose  Suicidal Thoughts:  No  Homicidal Thoughts:  No  Memory:  Immediate;   Fair Recent;   Fair  Judgement:  Poor  Insight:  Shallow  Psychomotor Activity:  Decreased  Concentration:  Concentration: Fair and Attention Span: Fair  Recall:  [employer]ervFair  Fund of Knowledge:Fair  Language: Fair  Akathisia:  Negative  Handed:  Right  AIMS (if indicated):    Assets:  Boy friend. support  ADL's:  Intact  Cognition: WNL  Sleep:  variable    Treatment Plan Summary: Medication management and Plan as follows   1. Schizophrenia: not paranoid. She understand she has kept abilify during first trimester and there could be risk which is category C. She want to continue . Will restart lower dose of 5mg .  If need or there is worsening of symptoms can increase    GAd: baseline. w No meds needed for now  Insomnia: reviewed sleep hygiene Advised regular visits as per for her pregnancy Will follow up in 4 weeks or earlier if needed      Thresa RossAKHTAR, Joneric Streight, MD 6/5/20183:47 PM

## 2017-04-18 NOTE — Addendum Note (Signed)
Addended by: Granville LewisLARK, Chevette Fee L on: 04/18/2017 04:36 PM   Modules accepted: Orders

## 2017-04-19 LAB — COMPREHENSIVE METABOLIC PANEL
ALT: 17 U/L (ref 6–29)
AST: 23 U/L (ref 10–30)
Albumin: 3.7 g/dL (ref 3.6–5.1)
Alkaline Phosphatase: 43 U/L (ref 33–115)
BILIRUBIN TOTAL: 0.2 mg/dL (ref 0.2–1.2)
BUN: 10 mg/dL (ref 7–25)
CHLORIDE: 104 mmol/L (ref 98–110)
CO2: 22 mmol/L (ref 20–31)
CREATININE: 0.77 mg/dL (ref 0.50–1.10)
Calcium: 9.7 mg/dL (ref 8.6–10.2)
Glucose, Bld: 202 mg/dL — ABNORMAL HIGH (ref 65–99)
Potassium: 4 mmol/L (ref 3.5–5.3)
SODIUM: 138 mmol/L (ref 135–146)
TOTAL PROTEIN: 6.7 g/dL (ref 6.1–8.1)

## 2017-04-19 LAB — HEMOGLOBIN A1C
Hgb A1c MFr Bld: 8.2 % — ABNORMAL HIGH (ref <5.7)
MEAN PLASMA GLUCOSE: 189 mg/dL

## 2017-04-19 LAB — T4, FREE: Free T4: 1.1 ng/dL (ref 0.8–1.8)

## 2017-04-19 LAB — T3, FREE: T3, Free: 3.3 pg/mL (ref 2.3–4.2)

## 2017-04-20 ENCOUNTER — Encounter: Payer: Self-pay | Admitting: *Deleted

## 2017-04-24 ENCOUNTER — Telehealth: Payer: Self-pay | Admitting: *Deleted

## 2017-04-24 DIAGNOSIS — O24419 Gestational diabetes mellitus in pregnancy, unspecified control: Secondary | ICD-10-CM

## 2017-04-24 MED ORDER — "INSULIN SYRINGE-NEEDLE U-100 27G X 1/2"" 1 ML MISC": 1.0000 | Freq: Two times a day (BID) | 10 refills | Status: DC

## 2017-04-24 NOTE — Telephone Encounter (Signed)
Pt called stating that she picked up her insulin but was not given the syringes.  RX for Insulin syringes sent to her pharmacy.

## 2017-04-27 ENCOUNTER — Ambulatory Visit (INDEPENDENT_AMBULATORY_CARE_PROVIDER_SITE_OTHER): Payer: Medicaid Other | Admitting: Obstetrics & Gynecology

## 2017-04-27 VITALS — Wt 299.0 lb

## 2017-04-27 DIAGNOSIS — O162 Unspecified maternal hypertension, second trimester: Secondary | ICD-10-CM | POA: Diagnosis not present

## 2017-04-27 DIAGNOSIS — O24419 Gestational diabetes mellitus in pregnancy, unspecified control: Secondary | ICD-10-CM | POA: Diagnosis not present

## 2017-04-27 DIAGNOSIS — O99210 Obesity complicating pregnancy, unspecified trimester: Secondary | ICD-10-CM

## 2017-04-27 DIAGNOSIS — O99212 Obesity complicating pregnancy, second trimester: Secondary | ICD-10-CM | POA: Diagnosis not present

## 2017-04-27 MED ORDER — INSULIN NPH (HUMAN) (ISOPHANE) 100 UNIT/ML ~~LOC~~ SUSP: SUBCUTANEOUS | 3 refills | Status: DC

## 2017-04-27 MED ORDER — INSULIN ASPART 100 UNIT/ML ~~LOC~~ SOLN: SUBCUTANEOUS | 12 refills | Status: DC

## 2017-04-27 NOTE — Progress Notes (Signed)
routine

## 2017-04-27 NOTE — Progress Notes (Signed)
   PRENATAL VISIT NOTE  Subjective:  Anna Jordan is a 26 y.o. G4P2011 at 5267w6d being seen today for ongoing prenatal care.  She is currently monitored for the following issues for this high-risk pregnancy and has Obesity in pregnancy; History of macrosomia in infant in prior pregnancy, currently pregnant; Chronic right-sided low back pain with right-sided sciatica; Generalized anxiety disorder; History of vertebral compression fracture; Tobacco use disorder; Gestational diabetes; and Hypertension affecting pregnancy in second trimester on her problem list.  Patient reports no complaints.  Contractions: Not present. Vag. Bleeding: None.  Movement: Absent. Denies leaking of fluid.   The following portions of the patient's history were reviewed and updated as appropriate: allergies, current medications, past family history, past medical history, past social history, past surgical history and problem list. Problem list updated.  Objective:   Vitals:   04/27/17 1618  Weight: 299 lb (135.6 kg)    Fetal Status: Fetal Heart Rate (bpm): 154   Movement: Absent     General:  Alert, oriented and cooperative. Patient is in no acute distress.  Skin: Skin is warm and dry. No rash noted.   Cardiovascular: Normal heart rate noted  Respiratory: Normal respiratory effort, no problems with respiration noted  Abdomen: Soft, gravid, appropriate for gestational age. Pain/Pressure: Absent     Pelvic:  Cervical exam deferred        Extremities: Normal range of motion.  Edema: None  Mental Status: Normal mood and affect. Normal behavior. Normal judgment and thought content.   Assessment and Plan:  Pregnancy: G4P2011 at 1967w6d  1. Obesity in pregnancy   2. Hypertension affecting pregnancy in second trimester Continue labetolol, baby asa  3. Gestational diabetes mellitus (GDM) in second trimester, gestational diabetes method of control unspecified Increase Novolin to 10 units q AM and q hs Add novolog  6 units with breakfast and 6 units with supper  Preterm labor symptoms and general obstetric precautions including but not limited to vaginal bleeding, contractions, leaking of fluid and fetal movement were reviewed in detail with the patient. Please refer to After Visit Summary for other counseling recommendations.  No Follow-up on file.   Allie BossierMyra C Drema Eddington, MD

## 2017-05-15 ENCOUNTER — Other Ambulatory Visit (HOSPITAL_COMMUNITY): Payer: Self-pay

## 2017-05-29 ENCOUNTER — Ambulatory Visit (HOSPITAL_COMMUNITY): Payer: Self-pay | Admitting: Psychiatry

## 2017-05-30 ENCOUNTER — Ambulatory Visit (INDEPENDENT_AMBULATORY_CARE_PROVIDER_SITE_OTHER): Payer: Medicaid Other | Admitting: Obstetrics & Gynecology

## 2017-05-30 VITALS — BP 110/62 | HR 110 | Wt 298.0 lb

## 2017-05-30 DIAGNOSIS — O99210 Obesity complicating pregnancy, unspecified trimester: Secondary | ICD-10-CM

## 2017-05-30 DIAGNOSIS — O162 Unspecified maternal hypertension, second trimester: Secondary | ICD-10-CM | POA: Diagnosis not present

## 2017-05-30 DIAGNOSIS — E669 Obesity, unspecified: Secondary | ICD-10-CM

## 2017-05-30 DIAGNOSIS — O99212 Obesity complicating pregnancy, second trimester: Secondary | ICD-10-CM

## 2017-05-30 NOTE — Progress Notes (Signed)
   PRENATAL VISIT NOTE  Subjective:  Anna Jordan is a 26 y.o. G4P2011 at 5150w4d being seen today for ongoing prenatal care.  She is currently monitored for the following issues for this high-risk pregnancy and has Obesity in pregnancy; History of macrosomia in infant in prior pregnancy, currently pregnant; Chronic right-sided low back pain with right-sided sciatica; Generalized anxiety disorder; History of vertebral compression fracture; Tobacco use disorder; Gestational diabetes; and Hypertension affecting pregnancy in second trimester on her problem list.  Patient reports no complaints.  Contractions: Not present. Vag. Bleeding: None.  Movement: Present. Denies leaking of fluid.   The following portions of the patient's history were reviewed and updated as appropriate: allergies, current medications, past family history, past medical history, past social history, past surgical history and problem list. Problem list updated.  Objective:   Vitals:   05/30/17 1441  BP: 110/62  Pulse: (!) 110  Weight: 298 lb (135.2 kg)    Fetal Status:     Movement: Present     General:  Alert, oriented and cooperative. Patient is in no acute distress.  Skin: Skin is warm and dry. No rash noted.   Cardiovascular: Normal heart rate noted  Respiratory: Normal respiratory effort, no problems with respiration noted  Abdomen: Soft, gravid, appropriate for gestational age.  Pain/Pressure: Absent     Pelvic: Cervical exam deferred        Extremities: Normal range of motion.  Edema: None  Mental Status:  Normal mood and affect. Normal behavior. Normal judgment and thought content.   Assessment and Plan:  Pregnancy: G4P2011 at 5950w4d  1. Obesity in pregnancy  - Comprehensive metabolic panel - Alpha fetoprotein, maternal - US MFM OB COMP + 14 WK; Future  2. Hypertension affecting pregnancy in second trimester 3. A2DM- she never got the regular as the pharmacy doesn't have it listed as prescribed. She  did get the NPH and is taking 5 units SQ aAM and qhs. Based on the sugars that she recalls (she didn't bring me a list), I will increase the NPH to 8 units qAM and qhs. She will bring the sugar sheet in next week and I can add regular insulin prn  - US MFM OB COMP + 14 WK; Future  Preterm labor symptoms and general obstetric precautions including but not limited to vaginal bleeding, contractions, leaking of fluid and fetal movement were reviewed in detail with the patient. Please refer to After Visit Summary for other counseling recommendations.  No Follow-up on file.   Allie BossierMyra C Peace Noyes, MD

## 2017-05-31 LAB — COMPREHENSIVE METABOLIC PANEL
ALBUMIN: 3.7 g/dL (ref 3.6–5.1)
ALT: 10 U/L (ref 6–29)
AST: 13 U/L (ref 10–30)
Alkaline Phosphatase: 52 U/L (ref 33–115)
BILIRUBIN TOTAL: 0.2 mg/dL (ref 0.2–1.2)
BUN: 10 mg/dL (ref 7–25)
CHLORIDE: 103 mmol/L (ref 98–110)
CO2: 21 mmol/L (ref 20–31)
CREATININE: 0.62 mg/dL (ref 0.50–1.10)
Calcium: 9.9 mg/dL (ref 8.6–10.2)
Glucose, Bld: 189 mg/dL — ABNORMAL HIGH (ref 65–99)
Potassium: 4.6 mmol/L (ref 3.5–5.3)
SODIUM: 139 mmol/L (ref 135–146)
TOTAL PROTEIN: 6.4 g/dL (ref 6.1–8.1)

## 2017-06-01 LAB — ALPHA FETOPROTEIN, MATERNAL
AFP: 47.4 ng/mL
Curr Gest Age: 19.6 weeks
MoM for AFP: 1.5
OPEN SPINA BIFIDA: NEGATIVE
Osb Risk: 1:2770 {titer}

## 2017-06-06 ENCOUNTER — Encounter: Payer: Self-pay | Admitting: Obstetrics & Gynecology

## 2017-06-09 ENCOUNTER — Ambulatory Visit (INDEPENDENT_AMBULATORY_CARE_PROVIDER_SITE_OTHER): Payer: Medicaid Other | Admitting: Certified Nurse Midwife

## 2017-06-09 ENCOUNTER — Other Ambulatory Visit: Payer: Self-pay | Admitting: Obstetrics & Gynecology

## 2017-06-09 ENCOUNTER — Ambulatory Visit (HOSPITAL_COMMUNITY)
Admission: RE | Admit: 2017-06-09 | Discharge: 2017-06-09 | Disposition: A | Discharge status: Home / Self Care | Payer: Medicaid Other | Source: Ambulatory Visit | Attending: Obstetrics & Gynecology | Admitting: Obstetrics & Gynecology

## 2017-06-09 VITALS — BP 110/62 | HR 108 | Wt 297.0 lb

## 2017-06-09 DIAGNOSIS — Z3A21 21 weeks gestation of pregnancy: Secondary | ICD-10-CM | POA: Insufficient documentation

## 2017-06-09 DIAGNOSIS — O99210 Obesity complicating pregnancy, unspecified trimester: Secondary | ICD-10-CM

## 2017-06-09 DIAGNOSIS — O169 Unspecified maternal hypertension, unspecified trimester: Secondary | ICD-10-CM | POA: Diagnosis not present

## 2017-06-09 DIAGNOSIS — O162 Unspecified maternal hypertension, second trimester: Secondary | ICD-10-CM

## 2017-06-09 DIAGNOSIS — Z6841 Body Mass Index (BMI) 40.0 and over, adult: Secondary | ICD-10-CM | POA: Diagnosis not present

## 2017-06-09 DIAGNOSIS — O09299 Supervision of pregnancy with other poor reproductive or obstetric history, unspecified trimester: Secondary | ICD-10-CM | POA: Diagnosis not present

## 2017-06-09 DIAGNOSIS — O10012 Pre-existing essential hypertension complicating pregnancy, second trimester: Secondary | ICD-10-CM | POA: Diagnosis not present

## 2017-06-09 DIAGNOSIS — O24419 Gestational diabetes mellitus in pregnancy, unspecified control: Secondary | ICD-10-CM | POA: Diagnosis not present

## 2017-06-09 DIAGNOSIS — F17200 Nicotine dependence, unspecified, uncomplicated: Secondary | ICD-10-CM | POA: Diagnosis not present

## 2017-06-09 DIAGNOSIS — O99212 Obesity complicating pregnancy, second trimester: Secondary | ICD-10-CM | POA: Insufficient documentation

## 2017-06-09 DIAGNOSIS — O09899 Supervision of other high risk pregnancies, unspecified trimester: Secondary | ICD-10-CM

## 2017-06-09 DIAGNOSIS — E669 Obesity, unspecified: Secondary | ICD-10-CM | POA: Diagnosis not present

## 2017-06-09 DIAGNOSIS — O24410 Gestational diabetes mellitus in pregnancy, diet controlled: Secondary | ICD-10-CM | POA: Diagnosis not present

## 2017-06-09 NOTE — Progress Notes (Signed)
140Subjective:  Anna HitchBrandi Jordan is a 26 y.o. G4P2011 at 7684w0d being seen today for ongoing prenatal care.  She is currently monitored for the following issues for this high-risk pregnancy and has Obesity in pregnancy; History of macrosomia in infant in prior pregnancy, currently pregnant; Chronic right-sided low back pain with right-sided sciatica; Generalized anxiety disorder; History of vertebral compression fracture; Tobacco use disorder; Gestational diabetes; Hypertension affecting pregnancy in second trimester; and Supervision of other high risk pregnancy, antepartum on her problem list.  Patient reports no complaints.  Contractions: Not present. Vag. Bleeding: Scant.  Movement: Present. Denies leaking of fluid.   The following portions of the patient's history were reviewed and updated as appropriate: allergies, current medications, past family history, past medical history, past social history, past surgical history and problem list. Problem list updated.  Objective:   Vitals:   06/09/17 1007  BP: 110/62  Pulse: (!) 108  Weight: 297 lb (134.7 kg)    Fetal Status: Fetal Heart Rate (bpm): 140   Movement: Present     General:  Alert, oriented and cooperative. Patient is in no acute distress.  Skin: Skin is warm and dry. No rash noted.   Cardiovascular: Normal heart rate noted  Respiratory: Normal respiratory effort, no problems with respiration noted  Abdomen: Soft, gravid, appropriate for gestational age. Pain/Pressure: Absent     Pelvic: Vag. Bleeding: Scant Vag D/C Character: Thin   Cervical exam deferred        Extremities: Normal range of motion.  Edema: None  Mental Status: Normal mood and affect. Normal behavior. Normal judgment and thought content.   Urinalysis:      Assessment and Plan:  Pregnancy: G4P2011 at 6284w0d  1. Hypertension during pregnancy, antepartum, unspecified hypertension in pregnancy type - CBC - Comprehensive metabolic panel - Protein, urine, 24  hour - Creatinine, urine, 24 hour; Future - Continue Labetalol  2. Supervision of other high risk pregnancy, antepartum  3. Obesity in pregnancy - weight stable  4. Gestational diabetes mellitus (GDM) in second trimester, gestational diabetes method of control unspecified - FBS 78-89, PP 105-115 (forgot log today) - Continue NPH 8 U am and hs - bring log to next visit - schedule fetal echo - schedule eye exam - completed diabetes education and nutrition consult in previous pregnancy  5. Tobacco use in pregnancy - 2-3 cig per day -encouraged cessation -discussed adverse effects on fetus  Preterm labor symptoms and general obstetric precautions including but not limited to vaginal bleeding, contractions, leaking of fluid and fetal movement were reviewed in detail with the patient. Please refer to After Visit Summary for other counseling recommendations.  Return in about 4 weeks (around 07/07/2017).   Donette LarryBhambri, Anna Jordan, CNM

## 2017-06-10 LAB — COMPREHENSIVE METABOLIC PANEL
ALT: 11 U/L (ref 6–29)
AST: 9 U/L — AB (ref 10–30)
Albumin: 3.5 g/dL — ABNORMAL LOW (ref 3.6–5.1)
Alkaline Phosphatase: 61 U/L (ref 33–115)
BILIRUBIN TOTAL: 0.2 mg/dL (ref 0.2–1.2)
BUN: 8 mg/dL (ref 7–25)
CO2: 20 mmol/L (ref 20–31)
CREATININE: 0.63 mg/dL (ref 0.50–1.10)
Calcium: 9.6 mg/dL (ref 8.6–10.2)
Chloride: 103 mmol/L (ref 98–110)
GLUCOSE: 303 mg/dL — AB (ref 65–99)
Potassium: 4 mmol/L (ref 3.5–5.3)
SODIUM: 136 mmol/L (ref 135–146)
Total Protein: 6.2 g/dL (ref 6.1–8.1)

## 2017-06-10 LAB — CBC
HCT: 37.2 % (ref 35.0–45.0)
Hemoglobin: 11.9 g/dL (ref 11.7–15.5)
MCH: 29.2 pg (ref 27.0–33.0)
MCHC: 32 g/dL (ref 32.0–36.0)
MCV: 91.2 fL (ref 80.0–100.0)
MPV: 10.2 fL (ref 7.5–12.5)
Platelets: 265 10*3/uL (ref 140–400)
RBC: 4.08 MIL/uL (ref 3.80–5.10)
RDW: 14.6 % (ref 11.0–15.0)
WBC: 10.1 10*3/uL (ref 3.8–10.8)

## 2017-06-10 LAB — PROTEIN, URINE, 24 HOUR
Protein, 24H Urine: 180 mg/24 h — ABNORMAL HIGH (ref <150)
Protein, Urine: 6 mg/dL (ref 5–24)

## 2017-06-18 ENCOUNTER — Inpatient Hospital Stay (HOSPITAL_COMMUNITY)
Admission: AD | Admit: 2017-06-18 | Discharge: 2017-06-18 | Disposition: A | Discharge status: Home / Self Care | Payer: Medicaid Other | Source: Ambulatory Visit | Attending: Obstetrics & Gynecology | Admitting: Obstetrics & Gynecology

## 2017-06-18 ENCOUNTER — Encounter (HOSPITAL_COMMUNITY): Payer: Self-pay | Admitting: Obstetrics and Gynecology

## 2017-06-18 DIAGNOSIS — Z87891 Personal history of nicotine dependence: Secondary | ICD-10-CM | POA: Insufficient documentation

## 2017-06-18 DIAGNOSIS — O26892 Other specified pregnancy related conditions, second trimester: Secondary | ICD-10-CM

## 2017-06-18 DIAGNOSIS — O98812 Other maternal infectious and parasitic diseases complicating pregnancy, second trimester: Secondary | ICD-10-CM | POA: Diagnosis not present

## 2017-06-18 DIAGNOSIS — B373 Candidiasis of vulva and vagina: Secondary | ICD-10-CM | POA: Insufficient documentation

## 2017-06-18 DIAGNOSIS — B3731 Acute candidiasis of vulva and vagina: Secondary | ICD-10-CM | POA: Diagnosis present

## 2017-06-18 DIAGNOSIS — O162 Unspecified maternal hypertension, second trimester: Secondary | ICD-10-CM

## 2017-06-18 DIAGNOSIS — R102 Pelvic and perineal pain: Secondary | ICD-10-CM | POA: Diagnosis not present

## 2017-06-18 DIAGNOSIS — Z3A22 22 weeks gestation of pregnancy: Secondary | ICD-10-CM | POA: Insufficient documentation

## 2017-06-18 DIAGNOSIS — O09899 Supervision of other high risk pregnancies, unspecified trimester: Secondary | ICD-10-CM

## 2017-06-18 DIAGNOSIS — Z880 Allergy status to penicillin: Secondary | ICD-10-CM | POA: Diagnosis not present

## 2017-06-18 DIAGNOSIS — Z885 Allergy status to narcotic agent status: Secondary | ICD-10-CM | POA: Diagnosis not present

## 2017-06-18 HISTORY — DX: Candidiasis of vulva and vagina: B37.3

## 2017-06-18 LAB — WET PREP, GENITAL
Clue Cells Wet Prep HPF POC: NONE SEEN
Sperm: NONE SEEN
TRICH WET PREP: NONE SEEN

## 2017-06-18 LAB — URINALYSIS, ROUTINE W REFLEX MICROSCOPIC
BILIRUBIN URINE: NEGATIVE
Glucose, UA: >=500 mg/dL — AB
KETONES UR: 20 mg/dL — AB
Leukocytes, UA: NEGATIVE
Nitrite: NEGATIVE
PROTEIN: NEGATIVE mg/dL
Specific Gravity, Urine: 1.036 — ABNORMAL HIGH (ref 1.005–1.030)
pH: 6 (ref 5.0–8.0)

## 2017-06-18 MED ORDER — TERCONAZOLE 0.4 % VA CREA: 1.0000 | TOPICAL_CREAM | Freq: Every day | VAGINAL | 0 refills | Status: DC

## 2017-06-18 MED ORDER — ACETAMINOPHEN 500 MG PO TABS
1000.0000 mg | ORAL_TABLET | Freq: Once | ORAL | Status: AC
  Administered 2017-06-18: 1000 mg via ORAL
  Filled 2017-06-18: qty 2

## 2017-06-18 MED ORDER — CYCLOBENZAPRINE HCL 5 MG PO TABS: 5.0000 mg | ORAL_TABLET | Freq: Three times a day (TID) | ORAL | Status: DC | PRN

## 2017-06-18 NOTE — MAU Note (Signed)
Pt reports feeling a lot of pelvic pressure and contractions that started abour 1.5 hours ago. Pt denies vaginal bleeding or LOF.

## 2017-06-18 NOTE — MAU Provider Note (Signed)
History     CSN: [id number]  Arrival date and time: 06/18/17 2029   First Provider Initiated Contact with Patient 06/18/17 2129      Chief Complaint  Patient presents with  . Contractions   HPI  Ms. Anna Jordan is a 26 yo G4P2011 at 22.[redacted] wks gestation presenting to MAU with complaints of pelvic pressure and cramping x 2 hrs.  She spent 5 hrs today swimming in a pool and noticed the pain began shortly after that.  She states she had about 6 bottles of water today, 3 bottles of Gatorade and Body juice. She denies VB or LOF.  She reports (+) FM all day today.    Past Medical History:  Diagnosis Date  . Gestational diabetes   . Hypertension   . Obesity   . Schizophrenia [facility])     Past Surgical History:  Procedure Laterality Date  . WISDOM TOOTH EXTRACTION      Family History  Problem Relation Age of Onset  . Depression Mother   . Diabetes Mother   . Heart disease Mother   . Stroke Mother   . Heart attack Father   . Breast cancer Maternal Aunt        4 mat aunts with breast cancer    Social History  Substance Use Topics  . Smoking status: Former Smoker    Packs/day: 0.25    Types: Cigarettes    Quit date: 03/31/2017  . Smokeless tobacco: Never Used     Comment: reduce # the cigarettes   . Alcohol use No    Allergies:  Allergies  Allergen Reactions  . Hydrocodone Hives  . Morphine Hives  . Tramadol Hives  . Penicillin G Rash    Facility-Administered Medications Prior to Admission  Medication Dose Route Frequency Provider Last Rate Last Dose  . insulin starter kit- syringes (English) 1 kit  1 kit Other Once Viola, Wilhemina Cash, MD       Prescriptions Prior to Admission  Medication Sig Dispense Refill Last Dose  . insulin NPH Human (HUMULIN N,NOVOLIN N) 100 UNIT/ML injection Inject 5 units SQ AQM and qhs 10 mL 3 06/18/2017 at Unknown time  . labetalol (NORMODYNE) 200 MG tablet Take 2 tablets (400 mg total) by mouth 2 (two) times daily. 60 tablet 3 06/18/2017 at  Unknown time  . ACCU-CHEK FASTCLIX LANCETS MISC 1 Units by Percutaneous route 4 (four) times daily. 100 each 12 Taking  . ARIPiprazole (ABILIFY) 5 MG tablet Take 1 tablet (5 mg total) by mouth daily. 30 tablet 1 More than a month at Unknown time  . Blood Glucose Monitoring Suppl (ACCU-CHEK NANO SMARTVIEW) w/Device KIT 1 kit by Subdermal route as directed. Check blood sugars for fasting, and two hours after breakfast, lunch and dinner (4 checks daily) 1 kit 0 Taking  . Doxylamine-Pyridoxine (DICLEGIS) 10-10 MG TBEC Take 2 tablets by mouth at bedtime. 60 tablet 2 Taking  . glucose blood (ACCU-CHEK SMARTVIEW) test strip Use as instructed to check blood sugars 100 each 12 Taking  . insulin aspart (NOVOLOG) 100 UNIT/ML injection 6 units with breakfast and  6 units with supper 10 mL 12 More than a month at Unknown time  . Insulin Syringe-Needle U-100 (SAFETY INSULIN SYRINGES) 27G X 1/2" 1 ML MISC 1 Syringe by Does not apply route 2 (two) times daily. 60 each 10 Taking  . ondansetron (ZOFRAN-ODT) 4 MG disintegrating tablet DISSOLVE  1 T PO Q 8 H PRF NAUSEA  0 More than a  month at Unknown time  . PROAIR HFA 108 (90 Base) MCG/ACT inhaler INHALE 2 PUFFS PO INTO THE LUNGS Q 4 H PRF WHEEZING  0 More than a month at Unknown time  . promethazine (PHENERGAN) 25 MG tablet Take 1 tablet (25 mg total) by mouth every 6 (six) hours as needed for nausea or vomiting. 30 tablet 2 Taking  . ranitidine (ZANTAC) 150 MG tablet Take by mouth.       Review of Systems  Constitutional: Negative.   HENT: Negative.   Eyes: Negative.   Respiratory: Negative.   Cardiovascular: Negative.   Gastrointestinal: Positive for abdominal pain (?contractions).  Endocrine: Negative.   Genitourinary: Positive for pelvic pain (pressure).  Musculoskeletal: Negative.   Skin: Negative.   Allergic/Immunologic: Negative.   Neurological: Negative.   Hematological: Negative.   Psychiatric/Behavioral: Negative.    Physical Exam   Blood  pressure (!) 116/55, pulse (!) 119, temperature 98 F (36.7 C), temperature source Oral, resp. rate 20, height '5\' 3"'  (1.6 m), weight 134.3 kg (296 lb), last menstrual period 12/25/2016, SpO2 98 %.  Physical Exam  Constitutional: She is oriented to person, place, and time. She appears well-developed and well-nourished.  HENT:  Head: Normocephalic.  Eyes: Pupils are equal, round, and reactive to light.  Neck: Normal range of motion.  Cardiovascular: Normal rate, regular rhythm and normal heart sounds.   Respiratory: Breath sounds normal.  GI: Soft. Bowel sounds are normal.  Genitourinary:  Genitourinary Comments: Uterus: gravid, cx; smooth, pink, no lesions, moderate amt of thick, white d/c, closed/long/firm, no CMT or friability, no adnexal tenderness   Musculoskeletal: Normal range of motion.  Neurological: She is alert and oriented to person, place, and time. She has normal reflexes.  Skin: Skin is warm and dry.  Psychiatric: She has a normal mood and affect. Her behavior is normal. Judgment and thought content normal.    MAU Course  Procedures  MDM CCUA Wet Prep Tylenol 1000 mg po  Results for orders placed or performed during the hospital encounter of 06/18/17 (from the past 24 hour(s))  Wet prep, genital     Status: Abnormal   Collection Time: 06/18/17 12:35 PM  Result Value Ref Range   Yeast Wet Prep HPF POC PRESENT (A) NONE SEEN   Trich, Wet Prep NONE SEEN NONE SEEN   Clue Cells Wet Prep HPF POC NONE SEEN NONE SEEN   WBC, Wet Prep HPF POC FEW (A) NONE SEEN   Sperm NONE SEEN   Urinalysis, Routine w reflex microscopic     Status: Abnormal   Collection Time: 06/18/17  8:35 PM  Result Value Ref Range   Color, Urine YELLOW YELLOW   APPearance CLEAR CLEAR   Specific Gravity, Urine 1.036 (H) 1.005 - 1.030   pH 6.0 5.0 - 8.0   Glucose, UA >=500 (A) NEGATIVE mg/dL   Hgb urine dipstick SMALL (A) NEGATIVE   Bilirubin Urine NEGATIVE NEGATIVE   Ketones, ur 20 (A) NEGATIVE  mg/dL   Protein, ur NEGATIVE NEGATIVE mg/dL   Nitrite NEGATIVE NEGATIVE   Leukocytes, UA NEGATIVE NEGATIVE   RBC / HPF 0-5 0 - 5 RBC/hpf   WBC, UA 0-5 0 - 5 WBC/hpf   Bacteria, UA RARE (A) NONE SEEN   Squamous Epithelial / LPF 0-5 (A) NONE SEEN   Mucous PRESENT    Budding Yeast PRESENT     Assessment and Plan  Pelvic pain affecting pregnancy in second trimester, antepartum - Advised that pelvic pain is probably from  excessive activities today - Continue hydrating with water -- exclude Gatorade and other sugary drinks from hydration - Rx Flexeril 5 mg po TID prn pain  Candida vaginitis - Rx for Terazol cream  Discharge home Keep scheduled appt on 8/24 Patient verbalized an understanding of the plan of care and agrees.   Laury Deep, MSN, CNM 06/18/2017, 9:29 PM

## 2017-07-07 ENCOUNTER — Encounter: Payer: Self-pay | Admitting: Certified Nurse Midwife

## 2017-07-07 ENCOUNTER — Inpatient Hospital Stay (HOSPITAL_COMMUNITY)
Admission: AD | Admit: 2017-07-07 | Discharge: 2017-07-07 | Disposition: A | Discharge status: Home / Self Care | Payer: Medicaid Other | Source: Ambulatory Visit | Attending: Family Medicine | Admitting: Family Medicine

## 2017-07-07 ENCOUNTER — Encounter (HOSPITAL_COMMUNITY): Payer: Self-pay

## 2017-07-07 ENCOUNTER — Ambulatory Visit (INDEPENDENT_AMBULATORY_CARE_PROVIDER_SITE_OTHER): Payer: Medicaid Other | Admitting: Certified Nurse Midwife

## 2017-07-07 VITALS — BP 100/70 | HR 92 | Wt 285.0 lb

## 2017-07-07 DIAGNOSIS — O99342 Other mental disorders complicating pregnancy, second trimester: Secondary | ICD-10-CM | POA: Insufficient documentation

## 2017-07-07 DIAGNOSIS — Z331 Pregnant state, incidental: Secondary | ICD-10-CM

## 2017-07-07 DIAGNOSIS — O24419 Gestational diabetes mellitus in pregnancy, unspecified control: Secondary | ICD-10-CM

## 2017-07-07 DIAGNOSIS — O24414 Gestational diabetes mellitus in pregnancy, insulin controlled: Secondary | ICD-10-CM | POA: Insufficient documentation

## 2017-07-07 DIAGNOSIS — O99212 Obesity complicating pregnancy, second trimester: Secondary | ICD-10-CM | POA: Diagnosis not present

## 2017-07-07 DIAGNOSIS — Z87891 Personal history of nicotine dependence: Secondary | ICD-10-CM | POA: Diagnosis not present

## 2017-07-07 DIAGNOSIS — F209 Schizophrenia, unspecified: Secondary | ICD-10-CM | POA: Insufficient documentation

## 2017-07-07 DIAGNOSIS — O212 Late vomiting of pregnancy: Secondary | ICD-10-CM | POA: Diagnosis not present

## 2017-07-07 DIAGNOSIS — Z3A25 25 weeks gestation of pregnancy: Secondary | ICD-10-CM | POA: Insufficient documentation

## 2017-07-07 DIAGNOSIS — Z1389 Encounter for screening for other disorder: Secondary | ICD-10-CM

## 2017-07-07 DIAGNOSIS — E1165 Type 2 diabetes mellitus with hyperglycemia: Secondary | ICD-10-CM

## 2017-07-07 DIAGNOSIS — O219 Vomiting of pregnancy, unspecified: Secondary | ICD-10-CM

## 2017-07-07 DIAGNOSIS — O09892 Supervision of other high risk pregnancies, second trimester: Secondary | ICD-10-CM | POA: Diagnosis not present

## 2017-07-07 DIAGNOSIS — O162 Unspecified maternal hypertension, second trimester: Secondary | ICD-10-CM

## 2017-07-07 DIAGNOSIS — R197 Diarrhea, unspecified: Secondary | ICD-10-CM | POA: Diagnosis present

## 2017-07-07 DIAGNOSIS — O09899 Supervision of other high risk pregnancies, unspecified trimester: Secondary | ICD-10-CM

## 2017-07-07 LAB — COMPREHENSIVE METABOLIC PANEL
ALBUMIN: 3.3 g/dL — AB (ref 3.5–5.0)
ALK PHOS: 61 U/L (ref 38–126)
ALT: 15 U/L (ref 14–54)
ANION GAP: 10 (ref 5–15)
AST: 17 U/L (ref 15–41)
BUN: 6 mg/dL (ref 6–20)
CALCIUM: 9.5 mg/dL (ref 8.9–10.3)
CO2: 23 mmol/L (ref 22–32)
Chloride: 104 mmol/L (ref 101–111)
Creatinine, Ser: 0.57 mg/dL (ref 0.44–1.00)
GFR calc Af Amer: >60 mL/min (ref >60)
GFR calc non Af Amer: >60 mL/min (ref >60)
GLUCOSE: 219 mg/dL — AB (ref 65–99)
Potassium: 3.9 mmol/L (ref 3.5–5.1)
SODIUM: 137 mmol/L (ref 135–145)
Total Bilirubin: 0.4 mg/dL (ref 0.3–1.2)
Total Protein: 7.2 g/dL (ref 6.5–8.1)

## 2017-07-07 LAB — CBC
HEMATOCRIT: 37.5 % (ref 36.0–46.0)
HEMOGLOBIN: 12.5 g/dL (ref 12.0–15.0)
MCH: 29 pg (ref 26.0–34.0)
MCHC: 33.3 g/dL (ref 30.0–36.0)
MCV: 87 fL (ref 78.0–100.0)
Platelets: 236 10*3/uL (ref 150–400)
RBC: 4.31 MIL/uL (ref 3.87–5.11)
RDW: 14.1 % (ref 11.5–15.5)
WBC: 10.8 10*3/uL — ABNORMAL HIGH (ref 4.0–10.5)

## 2017-07-07 LAB — URINALYSIS, ROUTINE W REFLEX MICROSCOPIC
BACTERIA UA: NONE SEEN
BILIRUBIN URINE: NEGATIVE
Glucose, UA: >=500 mg/dL — AB
Ketones, ur: 5 mg/dL — AB
Leukocytes, UA: NEGATIVE
Nitrite: NEGATIVE
PROTEIN: NEGATIVE mg/dL
SPECIFIC GRAVITY, URINE: 1.032 — AB (ref 1.005–1.030)
pH: 6 (ref 5.0–8.0)

## 2017-07-07 LAB — GLUCOSE, CAPILLARY: Glucose-Capillary: 208 mg/dL — ABNORMAL HIGH (ref 65–99)

## 2017-07-07 LAB — GLUCOSE, POCT (MANUAL RESULT ENTRY): POC Glucose: 332 mg/dl — AB (ref 70–99)

## 2017-07-07 MED ORDER — DOXYLAMINE-PYRIDOXINE 10-10 MG PO TBEC: 2.0000 | DELAYED_RELEASE_TABLET | Freq: Every day | ORAL | 2 refills | Status: DC

## 2017-07-07 MED ORDER — INSULIN NPH (HUMAN) (ISOPHANE) 100 UNIT/ML ~~LOC~~ SUSP: SUBCUTANEOUS | 3 refills | Status: DC

## 2017-07-07 MED ORDER — INSULIN ASPART 100 UNIT/ML ~~LOC~~ SOLN: SUBCUTANEOUS | 12 refills | Status: DC

## 2017-07-07 MED ORDER — SODIUM CHLORIDE 0.9 % IV BOLUS (SEPSIS)
1000.0000 mL | Freq: Once | INTRAVENOUS | Status: AC
  Administered 2017-07-07: 1000 mL via INTRAVENOUS

## 2017-07-07 NOTE — Progress Notes (Signed)
Large-Ketones

## 2017-07-07 NOTE — Discharge Instructions (Signed)
Follow-up next week with OB for diabetes Insulin has been increased Take Diclegis 1 tab in morning and 2 tabs in afternoon   Gestational Diabetes Mellitus, Self Care Caring for yourself after you have been diagnosed with gestational diabetes (gestational diabetes mellitus) means keeping your blood sugar (glucose) under control with a balance of:  Nutrition.  Exercise.  Lifestyle changes.  Medicines or insulin, if necessary.  Support from your team of health care providers and others.  The following information explains what you need to know to manage your gestational diabetes at home. What do I need to do to manage my blood glucose?  Check your blood glucose every day during your pregnancy. Do this as often as told by your health care provider.  Contact your health care provider if your blood glucose is above your target for 2 tests in a row. Your health care provider will set individualized treatment goals for you. Generally, the goal of treatment is to maintain the following blood glucose levels during pregnancy:  After not eating for 8 hours (after fasting): at or below 95 mg/dL (5.3 mmol/L).  After meals (postprandial): ? One hour after a meal: at or below 140 mg/dL (7.8 mmol/L). ? Two hours after a meal: at or below 120 mg/dL (6.7 mmol/L).  A1c (hemoglobin A1c) level: 6-6.5%.  What do I need to know about hyperglycemia and hypoglycemia? What is hyperglycemia? Hyperglycemia, also called high blood glucose, occurs when blood glucose is too high. Make sure you know the early signs of hyperglycemia, such as:  Increased thirst.  Hunger.  Feeling very tired.  Needing to urinate more often than usual.  Blurry vision.  What is hypoglycemia? Hypoglycemia, also called low blood glucose, occurswith a blood glucose level at or below 70 mg/dL (3.9 mmol/L). The risk for hypoglycemia increases during or after exercise, during sleep, during illness, and when skipping meals or  not eating for a long time (fasting). It is important to know the symptoms of hypoglycemia and treat it right away. Always have a 15-gram rapid-acting carbohydrate snack with you to treat low blood glucose.Family members and close friends should also know the symptoms and should understand how to treat hypoglycemia, in case you are not able to treat yourself. What are the symptoms of hypoglycemia? Hypoglycemia symptoms can include:  Hunger.  Anxiety.  Sweating and feeling clammy.  Confusion.  Dizziness or feeling light-headed.  Sleepiness.  Nausea.  Increased heart rate.  Headache.  Blurry vision.  Seizure.  Nightmares.  Tingling or numbness around the mouth, lips, or tongue.  A change in speech.  Decreased ability to concentrate.  A change in coordination.  Restless sleep.  Tremors or shakes.  Fainting.  Irritability.  How do I treat hypoglycemia?  If you are alert and able to swallow safely, follow the 15:15 rule:  Take 15 grams of a rapid-acting carbohydrate. Rapid-acting options include: ? 1 tube of glucose gel. ? 3 glucose pills. ? 6-8 pieces of hard candy. ? 4 oz (120 mL) of fruit juice. ? 4 oz (120 mL) of regular (not diet) soda.  Check your blood glucose 15 minutes after you take the carbohydrate.  If the repeat blood glucose level is still at or below 70 mg/dL (3.9 mmol/L), take 15 grams of a carbohydrate again.  If your blood glucose level does not increase above 70 mg/dL (3.9 mmol/L) after 3 tries, seek emergency medical care.  After your blood glucose level returns to normal, eat a meal or a snack  within 1 hour.  How do I treat severe hypoglycemia? Severe hypoglycemia is when your blood glucose level is at or below 54 mg/dL (3 mmol/L). Severe hypoglycemia is an emergency. Do not wait to see if the symptoms will go away. Get medical help right away. Call your local emergency services (911 in the [place] Do not drive yourself to the  hospital. If you have severe hypoglycemia and you cannot eat or drink, you may need an injection of glucagon. A family member or close friend should learn how to check your blood glucose and how to give you a glucagon injection. Ask your health care provider if you need to have an emergency glucagon injection kit available. Severe hypoglycemia may need to be treated in a hospital. The treatment may include getting glucose through an IV tube. You may also need treatment for the cause of your hypoglycemia. What else can I do to manage my gestational diabetes? Take your diabetes medicines as told  If your health care provider prescribed insulin or diabetes medicines, take them every day.  Do not run out of insulin or other diabetes medicines that you take. Plan ahead so you always have these available.  If you use insulin, adjust your dosage based on how physically active you are and what foods you eat. Your health care provider will tell you how to adjust your dosage. Make healthy food choices  The things that you eat and drink affect your blood glucose. Making good choices helps to control your diabetes and prevent other health problems. A healthy meal plan includes eating lean proteins, complex carbohydrates, fresh fruits and vegetables, low-fat dairy products, and healthy fats. Make an appointment to see a diet and nutrition specialist (registered dietitian) to help you create an eating plan that is right for you. Make sure that you:  Follow instructions from your health care provider about eating or drinking restrictions.  Drink enough fluid to keep your urine clear or pale yellow.  Eat healthy snacks between nutritious meals.  Track the carbohydrates that you eat. Do this by reading food labels and learning the standard serving sizes of foods.  Follow your sick day plan whenever you cannot eat or drink as usual. Make this plan in advance with your health care provider.  Stay  active   Do at least 30 minutes of physical activity a day, or as much physical activity as your health care provider recommends during your pregnancy. ? Doing 10 minutes of exercise 30 minutes after each meal may help to control postprandial blood glucose levels.  If you start a new exercise or activity, work with your health care provider to adjust your insulin, medicines, or food intake as needed. Make healthy lifestyle choices  Do not drink alcohol.  Do not use any tobacco products, such as cigarettes, chewing tobacco, and e-cigarettes. If you need help quitting, ask your health care provider.  Learn to manage stress. If you need help with this, ask your health care provider. Care for your body  Keep your immunizations up to date.  Brush your teeth and gums two times a day, and floss at least one time a day.  Visit your dentist at least once every 6 months.  Maintain a healthy weight during your pregnancy. General instructions   Take over-the-counter and prescription medicines only as told by your health care provider.  Talk with your health care provider about your risk for high blood pressure during pregnancy (preeclampsia or eclampsia).  Share your diabetes  management plan with people in your workplace, school, and household.  Check your urine for ketones during your pregnancy when you are ill and as told by your health care provider.  Carry a medical alert card or wear medical alert jewelry.  Ask your health care provider: ? Do I need to meet with a diabetes educator? ? Where can I find a support group for people with diabetes?  Keep all follow-up visits during your pregnancy (prenatal) and after delivery (postnatal) as told by your health care provider. This is important. Get the care that you need after delivery  Have your blood glucose level checked 4-12 weeks after delivery. This is done with an oral glucose tolerance test (OGTT).  Get screened for diabetes at  least every 3 years, or as often as told by your health care provider. Where to find more information: To learn more about gestational diabetes, visit:  American Diabetes Association (ADA): www.diabetes.org/diabetes-basics/gestational  Centers for Disease Control and Prevention (CDC): http://sanchez-watson.com/.pdf  This information is not intended to replace advice given to you by your health care provider. Make sure you discuss any questions you have with your health care provider. Document Released: 02/22/2016 Document Revised: 04/07/2016 Document Reviewed: 12/04/2015 Elsevier Interactive Patient Education  2017 [employer].

## 2017-07-07 NOTE — MAU Note (Signed)
Pt presents to MAU after being evaluated by Fabian November CNM today in the office for increase in blood sugar, diarrhea, N/V, not feeling well.

## 2017-07-07 NOTE — MAU Provider Note (Signed)
Chief Complaint:  Diarrhea and Gestational Diabetes   HPI: Anna Jordan is a 26 y.o. G3P1011 at 73w0dwho presents to MAU from clinic due to elevated blood sugars with concern for DKA. Patient states that at home she has been getting good CBG control. However in clinic today her POC CBG was 334 and she had 4+ glucose in her urine. She has associated weight loss, nausea, and vomiting. Symptoms started about 3 weeks ago. She has tried her antiemetics without benefit. She endorses only taking 8U of NPH BID for her diabetes control.   Denies contractions, leakage of fluid, vaginal discharge, or vaginal bleeding. Good fetal movement.   Pregnancy Course:  Receives prenatal care at [facility] Past Medical History: Past Medical History:  Diagnosis Date  . Candida vaginitis 06/18/2017  . Gestational diabetes   . Hypertension   . Obesity   . Schizophrenia ([facility]     Past obstetric history: OB History  Gravida Para Term Preterm AB Living  _0 SAB TAB Ectopic Multiple Live Births  1       1    # Outcome Date GA Lbr Len/2nd Weight Sex Delivery Anes PTL Lv  3 Current           2 SAB           1 Term               Past Surgical History: Past Surgical History:  Procedure Laterality Date  . WISDOM TOOTH EXTRACTION       Family History: Family History  Problem Relation Age of Onset  . Depression Mother   . Diabetes Mother   . Heart disease Mother   . Stroke Mother   . Heart attack Father   . Breast cancer Maternal Aunt        4 mat aunts with breast cancer    Social History: Social History  Substance Use Topics  . Smoking status: Former Smoker    Packs/day: 0.25    Types: Cigarettes    Quit date: 03/31/2017  . Smokeless tobacco: Never Used     Comment: reduce # the cigarettes   . Alcohol use No    Allergies:  Allergies  Allergen Reactions  . Hydrocodone Hives  . Morphine Hives  . Tramadol Hives  . Penicillin G Rash    Meds:  Facility-Administered  Medications Prior to Admission  Medication Dose Route Frequency Provider Last Rate Last Dose  . insulin starter kit- syringes (English) 1 kit  1 kit Other Once DSan Geronimo MWilhemina Cash MD       Prescriptions Prior to Admission  Medication Sig Dispense Refill Last Dose  . ACCU-CHEK FASTCLIX LANCETS MISC 1 Units by Percutaneous route 4 (four) times daily. 100 each 12 Taking  . ARIPiprazole (ABILIFY) 5 MG tablet Take 1 tablet (5 mg total) by mouth daily. (Patient not taking: Reported on 07/07/2017) 30 tablet 1 Not Taking  . Blood Glucose Monitoring Suppl (ACCU-CHEK NANO SMARTVIEW) w/Device KIT 1 kit by Subdermal route as directed. Check blood sugars for fasting, and two hours after breakfast, lunch and dinner (4 checks daily) 1 kit 0 Taking  . Doxylamine-Pyridoxine (DICLEGIS) 10-10 MG TBEC Take 2 tablets by mouth at bedtime. 60 tablet 2 Taking  . glucose blood (ACCU-CHEK SMARTVIEW) test strip Use as instructed to check blood sugars 100 each 12 Taking  . insulin aspart (NOVOLOG) 100 UNIT/ML injection 6 units with breakfast and  6  units with supper 10 mL 12 Taking  . insulin NPH Human (HUMULIN N,NOVOLIN N) 100 UNIT/ML injection Inject 5 units SQ AQM and qhs 10 mL 3 Taking  . Insulin Syringe-Needle U-100 (SAFETY INSULIN SYRINGES) 27G X 1/2" 1 ML MISC 1 Syringe by Does not apply route 2 (two) times daily. 60 each 10 Taking  . labetalol (NORMODYNE) 200 MG tablet Take 2 tablets (400 mg total) by mouth 2 (two) times daily. 60 tablet 3 Taking  . ondansetron (ZOFRAN-ODT) 4 MG disintegrating tablet DISSOLVE  1 T PO Q 8 H PRF NAUSEA  0 Taking  . PROAIR HFA 108 (90 Base) MCG/ACT inhaler INHALE 2 PUFFS PO INTO THE LUNGS Q 4 H PRF WHEEZING  0 Taking  . ranitidine (ZANTAC) 150 MG tablet Take by mouth.       I have reviewed patient's Past Medical Hx, Surgical Hx, Family Hx, Social Hx, medications and allergies.   ROS:  All systems reviewed and are negative for acute change except as noted in the HPI.   Physical Exam   Patient Vitals for the past 24 hrs:  BP Pulse Resp SpO2  07/07/17 1348 117/66 (!) 113 18 98 %   Constitutional: Morbidly obese, Well-developed, female in no acute distress.  Cardiovascular: tachycardia, normal rhythm, pulses intact Respiratory: normal effort, no shallow breathing GI: Abd soft, protuberant, non-tender, gravid appropriate for gestational age. Pos BS x 4 MS: Extremities nontender, + edema, normal ROM Neurologic: Alert and oriented x 4. No focal deficits.  GU: Neg CVAT.   Labs: Results for orders placed or performed during the hospital encounter of 07/07/17 (from the past 24 hour(s))  Urinalysis, Routine w reflex microscopic     Status: Abnormal   Collection Time: 07/07/17  1:50 PM  Result Value Ref Range   Color, Urine YELLOW YELLOW   APPearance CLEAR CLEAR   Specific Gravity, Urine 1.032 (H) 1.005 - 1.030   pH 6.0 5.0 - 8.0   Glucose, UA >=500 (A) NEGATIVE mg/dL   Hgb urine dipstick SMALL (A) NEGATIVE   Bilirubin Urine NEGATIVE NEGATIVE   Ketones, ur 5 (A) NEGATIVE mg/dL   Protein, ur NEGATIVE NEGATIVE mg/dL   Nitrite NEGATIVE NEGATIVE   Leukocytes, UA NEGATIVE NEGATIVE   RBC / HPF 0-5 0 - 5 RBC/hpf   WBC, UA 0-5 0 - 5 WBC/hpf   Bacteria, UA NONE SEEN NONE SEEN   Squamous Epithelial / LPF 0-5 (A) NONE SEEN   Mucus PRESENT   Glucose, capillary     Status: Abnormal   Collection Time: 07/07/17  2:58 PM  Result Value Ref Range   Glucose-Capillary 208 (H) 65 - 99 mg/dL  Comprehensive metabolic panel     Status: Abnormal   Collection Time: 07/07/17  3:01 PM  Result Value Ref Range   Sodium 137 135 - 145 mmol/L   Potassium 3.9 3.5 - 5.1 mmol/L   Chloride 104 101 - 111 mmol/L   CO2 23 22 - 32 mmol/L   Glucose, Bld 219 (H) 65 - 99 mg/dL   BUN 6 6 - 20 mg/dL   Creatinine, Ser 0.57 0.44 - 1.00 mg/dL   Calcium 9.5 8.9 - 10.3 mg/dL   Total Protein 7.2 6.5 - 8.1 g/dL   Albumin 3.3 (L) 3.5 - 5.0 g/dL   AST 17 15 - 41 U/L   ALT 15 14 - 54 U/L   Alkaline  Phosphatase 61 38 - 126 U/L   Total Bilirubin 0.4 0.3 - 1.2 mg/dL     GFR calc non Af Amer >60 >60 mL/min   GFR calc Af Amer >60 >60 mL/min   Anion gap 10 5 - 15  CBC     Status: Abnormal   Collection Time: 07/07/17  3:01 PM  Result Value Ref Range   WBC 10.8 (H) 4.0 - 10.5 K/uL   RBC 4.31 3.87 - 5.11 MIL/uL   Hemoglobin 12.5 12.0 - 15.0 g/dL   HCT 37.5 36.0 - 46.0 %   MCV 87.0 78.0 - 100.0 fL   MCH 29.0 26.0 - 34.0 pg   MCHC 33.3 30.0 - 36.0 g/dL   RDW 14.1 11.5 - 15.5 %   Platelets 236 150 - 400 K/uL    Imaging:  [place] Mfm Ob Detail +14 Wk  Result Date: 06/09/2017 ----------------------------------------------------------------------  OBSTETRICS REPORT                      (Signed Final 06/09/2017 03:21 pm) ---------------------------------------------------------------------- Patient Info  ID #:       [id number]                         D.O.B.:   11/29/1990 (26 yrs)  Name:       YVONNIE SCHINKE                Visit Date:  06/09/2017 02:44 pm ---------------------------------------------------------------------- Performed By  Performed By:     Elisabeth Cara        Ref. Address:     [address]                    [place], [place]  Attending:        Silvestre Moment Whitecar        Location:         [facility]                    MD  Referred By:      Guss Bunde MD ---------------------------------------------------------------------- Orders   #  Description                                 Code   1  [place] MFM OB DETAIL +  74 WK                     32951.88  ----------------------------------------------------------------------   #  Ordered By               Order #        Accession #    Episode #   1  Clovia Cuff                [id number]      [id number]     [id number]   ---------------------------------------------------------------------- Indications   [redacted] weeks gestation of pregnancy                H0W.23   Obesity complicating pregnancy                 O99.210 E66.9   Hypertension - Chronic/Pre-existing            O10.019   Gestational diabetes in pregnancy, diet        O24.410   controlled   Poor obstetric history: Prior fetal            O09.299   macrosomia, antepartum   Smoking complicating pregnancy                 O99.331  ---------------------------------------------------------------------- OB History  Blood Type:            Height:  5'3"   Weight (lb):  296      BMI:   52.43  Gravidity:    4         Term:   2         SAB:   1  Living:       2 ---------------------------------------------------------------------- Fetal Evaluation  Num Of Fetuses:     1  Fetal Heart         153  Rate(bpm):  Cardiac Activity:   Observed  Presentation:       Variable  Placenta:           Anterior, above cervical os  P. Cord Insertion:  Visualized, central  Amniotic Fluid  AFI FV:      Subjectively within normal limits                              Largest Pocket(cm)                              5.66 ---------------------------------------------------------------------- Biometry  BPD:      51.5  mm     G. Age:  21w 5d         73  %    CI:         74.7   %   70 - 86                                                          FL/HC:      17.2   %   15.9 - 20.3  HC:      189.1  mm     G. Age:  21w 1d         50  %    HC/AC:  1.06       1.06 - 1.25  AC:      178.1  mm     G. Age:  22w 5d         90  %    FL/BPD:     63.3   %  FL:       32.6  mm     G. Age:  20w 1d         17  %    FL/AC:      18.3   %   20 - 24  HUM:      31.3  mm     G. Age:  20w 3d         31  %  CER:      22.3  mm     G. Age:  21w 1d         55  %  CM:        3.9  mm  Est. FW:     431  gm    0 lb 15 oz      53  % ---------------------------------------------------------------------- Gestational Age  LMP:           23w 5d        Date:   12/25/16                 EDD:   10/01/17  U/S Today:     21w 3d                                        EDD:   10/17/17  Best:          21w 0d    Det. By:   Early Ultrasound         EDD:   10/20/17                                      (03/14/17) ---------------------------------------------------------------------- Anatomy  Cranium:               Appears normal         Aortic Arch:            Previously seen  Cavum:                 Appears normal         Ductal Arch:            Appears normal  Ventricles:            Appears normal         Diaphragm:              Not well visualized  Choroid Plexus:        Appears normal         Stomach:                Appears normal, left                                                                          sided  Cerebellum:            Appears normal         Abdomen:                Appears normal  Posterior Fossa:       Appears normal         Abdominal Wall:         Appears nml (cord                                                                        insert, abd wall)  Nuchal Fold:           Not applicable (>62    Cord Vessels:           Appears normal ([redacted]                         wks GA)                                        vessel cord)  Face:                  Orbits nl; profile not Kidneys:                Appear normal                         well visualized  Lips:                  Appears normal         Bladder:                Appears normal  Thoracic:              Appears normal         Spine:                  Not well visualized  Heart:                 Not well visualized    Upper Extremities:      Appears normal  RVOT:                  Appears normal         Lower Extremities:      Appears normal  LVOT:                  Appears normal  Other:  Fetus appears to be a female. Heels and  rt 5th digit visualized.          Technically difficult due to maternal habitus and fetal position. ---------------------------------------------------------------------- Cervix Uterus  Adnexa  Cervix  Length:           4.24  cm.  Normal appearance by transabdominal scan.  Uterus  No abnormality visualized.  Left Ovary  Not visualized.  Right Ovary  Not visualized.  Adnexa:       No abnormality visualized. No adnexal mass  visualized. ---------------------------------------------------------------------- Impression  Single IUP at 21w 0d  Limited views of the fetal heart, spine and diaphragm obtained  The remainder of the fetal anatomy appears normal  Anterior placenta without previa  Normal amniotic fluid volume ---------------------------------------------------------------------- Recommendations  Recommend follow-up ultrasound examination in 4 weeks for  growth and to complete anatomy ----------------------------------------------------------------------                Paul W Whitecar, MD Electronically Signed Final Report   06/09/2017 03:21 pm ----------------------------------------------------------------------   MAU Course: CMP without AG or acidosis appreciated CBC unremarkable CBG elevated UA with glucose  I personally reviewed the patient's NST today, found to be REACTIVE. 140 bpm, mod var, +accels, no decels. CTX: None.   MDM: Plan of care reviewed with patient, including labs and tests ordered and medical treatment.   Assessment: 1. Insulin controlled gestational diabetes mellitus (GDM) in second trimester   2. Nausea and vomiting during pregnancy   3. Poorly controlled diabetes mellitus (HCC)    No signs of DKA but patient not on appropriate insulin regimen.   Plan: Increase NPH insulin to 16U twcie daily in AM and at bedtime Novolog 20U TID with meals Counseled on proper diet for diabetic Increased diclegis dosing Discharge home in stable condition.  Preterm labor precautions and fetal kick counts Handout given Follow-up with OB provider    , DO [facility] 07/07/2017 2:15  PM   

## 2017-07-07 NOTE — Progress Notes (Signed)
.   Subjective:  Anna Jordan is a 26 y.o. G4P2011 at [redacted]w[redacted]d being seen today for ongoing prenatal care.  She is currently monitored for the following issues for this high-risk pregnancy and has Obesity in pregnancy; History of macrosomia in infant in prior pregnancy, currently pregnant; Chronic right-sided low back pain with right-sided sciatica; Generalized anxiety disorder; History of vertebral compression fracture; Tobacco use disorder; Gestational diabetes; Hypertension affecting pregnancy in second trimester; Supervision of other high risk pregnancy, antepartum; Pelvic pain affecting pregnancy in second trimester, antepartum; and Candida vaginitis on her problem list.  Patient reports daily N/V, unable to tolerate even fluids at times, using Diclegis daily, not helping, "doesn't feel well".  Contractions: Not present. Vag. Bleeding: None.  Movement: Present. Denies leaking of fluid.   The following portions of the patient's history were reviewed and updated as appropriate: allergies, current medications, past family history, past medical history, past social history, past surgical history and problem list. Problem list updated.  Objective:   Vitals:   07/07/17 1103  BP: 100/70  Pulse: 92  Weight: 285 lb (129.3 kg)    Fetal Status: Fetal Heart Rate (bpm): 148   Movement: Present     General:  Alert, oriented and cooperative. Patient is in no acute distress.  Skin: Skin is diaphoretic. No rash noted.   Cardiovascular: Normal heart rate noted  Respiratory: Normal respiratory effort, no problems with respiration noted  Abdomen: Soft, gravid, appropriate for gestational age. Pain/Pressure: Absent     Pelvic: Vag. Bleeding: None Vag D/C Character: Thin   Cervical exam deferred        Extremities: Normal range of motion.  Edema: None  Mental Status: Normal mood and affect. Normal behavior. Normal judgment and thought content.   Urinalysis: Urine Protein: Trace Urine Glucose:  4+  Assessment and Plan:  Pregnancy: G4P2011 at [redacted]w[redacted]d  1. Hypertension affecting pregnancy in second trimester - BP stable on Labetalol - start AT at 28 wks - MFM [place] in 1 week for growth, AFI, and complete anatomy  2. Gestational diabetes mellitus (GDM) in second trimester, gestational diabetes method of control unspecified - [place] MFM OB FOLLOW UP; Future - CULTURE, URINE COMPREHENSIVE - POCT Glucose (CBG) - ECHO FETAL; Future - Ambulatory referral to Ophthalmology  - reports using NPH Insulin only >8 U bid - reports FBS 87-94, and pp 95-115 > did not bring log, she thinks glucose monitor is not accurate - CBG 334 today - UA: Lrg ketones, 4+ glucose - 12 lb wt loss - To MAU now for eval r/o DKA > Dr Doroteo Glassman aware  3. Supervision of other high risk pregnancy, antepartum  Preterm labor symptoms and general obstetric precautions including but not limited to vaginal bleeding, contractions, leaking of fluid and fetal movement were reviewed in detail with the patient. Please refer to After Visit Summary for other counseling recommendations.  Return in about 3 weeks (around 07/28/2017).   Donette Larry, CNM

## 2017-07-07 NOTE — Addendum Note (Signed)
Addended by: Granville Lewis on: 07/07/2017 11:57 AM   Modules accepted: Orders

## 2017-07-09 LAB — CULTURE, URINE COMPREHENSIVE

## 2017-07-10 ENCOUNTER — Telehealth: Payer: Self-pay

## 2017-07-10 NOTE — Telephone Encounter (Signed)
Called pt left voice mail to schedule appt for Wednesday with Dr. Erin Fulling.

## 2017-07-10 NOTE — Telephone Encounter (Signed)
-----   Message from Pincus Large, DO sent at 07/07/2017  4:41 PM EDT ----- Patient was seen in MAU for her diabetes. Can you please see if this patient can be scheduled on Dr. Erin Fulling schedule for Wednesday 07/12/17. (Discussed with Dr. Shawnie Pons)  Thanks, Sheran Spine!

## 2017-07-12 ENCOUNTER — Telehealth: Payer: Self-pay | Admitting: *Deleted

## 2017-07-12 ENCOUNTER — Ambulatory Visit (INDEPENDENT_AMBULATORY_CARE_PROVIDER_SITE_OTHER): Payer: Medicaid Other | Admitting: Obstetrics & Gynecology

## 2017-07-12 VITALS — BP 110/56 | HR 88 | Wt 288.0 lb

## 2017-07-12 DIAGNOSIS — M5441 Lumbago with sciatica, right side: Secondary | ICD-10-CM | POA: Diagnosis not present

## 2017-07-12 DIAGNOSIS — G8929 Other chronic pain: Secondary | ICD-10-CM | POA: Diagnosis not present

## 2017-07-12 DIAGNOSIS — O09899 Supervision of other high risk pregnancies, unspecified trimester: Secondary | ICD-10-CM | POA: Diagnosis not present

## 2017-07-12 DIAGNOSIS — O24410 Gestational diabetes mellitus in pregnancy, diet controlled: Secondary | ICD-10-CM | POA: Diagnosis not present

## 2017-07-12 DIAGNOSIS — F17200 Nicotine dependence, unspecified, uncomplicated: Secondary | ICD-10-CM | POA: Diagnosis not present

## 2017-07-12 DIAGNOSIS — O26892 Other specified pregnancy related conditions, second trimester: Secondary | ICD-10-CM | POA: Diagnosis not present

## 2017-07-12 DIAGNOSIS — F411 Generalized anxiety disorder: Secondary | ICD-10-CM | POA: Diagnosis not present

## 2017-07-12 DIAGNOSIS — Z8781 Personal history of (healed) traumatic fracture: Secondary | ICD-10-CM | POA: Diagnosis not present

## 2017-07-12 DIAGNOSIS — R102 Pelvic and perineal pain: Secondary | ICD-10-CM | POA: Diagnosis not present

## 2017-07-12 DIAGNOSIS — O09299 Supervision of pregnancy with other poor reproductive or obstetric history, unspecified trimester: Secondary | ICD-10-CM | POA: Diagnosis not present

## 2017-07-12 DIAGNOSIS — O99210 Obesity complicating pregnancy, unspecified trimester: Secondary | ICD-10-CM | POA: Diagnosis not present

## 2017-07-12 DIAGNOSIS — O162 Unspecified maternal hypertension, second trimester: Secondary | ICD-10-CM | POA: Diagnosis not present

## 2017-07-12 NOTE — Progress Notes (Signed)
CBG 167 

## 2017-07-12 NOTE — Telephone Encounter (Signed)
-----   Message from Anna Rosenthalarolyn Harraway-Smith, MD sent at 07/12/2017 12:08 PM EDT ----- Please call pt. Change in her insulin should be   Novalog 24u tid Novalin 20 units bid  This is a change from what I gave her today!   Thx,  clh-S

## 2017-07-12 NOTE — Telephone Encounter (Signed)
Spoke with pt and gave her update dosages of Novalog to 24u TID and Novalin N 20u BID.  Pt aware and will adjust accordingly per Dr Erin Fulling

## 2017-07-12 NOTE — Progress Notes (Signed)
   PRENATAL VISIT NOTE  Subjective:  Anna Jordan is a 26 y.o. G3P1011 at 4715w5d being seen today for ongoing prenatal care.  She is currently monitored for the following issues for this high-risk pregnancy and has Obesity in pregnancy; History of macrosomia in infant in prior pregnancy, currently pregnant; Chronic right-sided low back pain with right-sided sciatica; Generalized anxiety disorder; History of vertebral compression fracture; Tobacco use disorder; Gestational diabetes; Hypertension affecting pregnancy in second trimester; Supervision of other high risk pregnancy, antepartum; Pelvic pain affecting pregnancy in second trimester, antepartum; and Candida vaginitis on her problem list.  Patient reports glucose note well controlled.  Contractions: Not present. Vag. Bleeding: None.  Movement: Present. Denies leaking of fluid.   The following portions of the patient's history were reviewed and updated as appropriate: allergies, current medications, past family history, past medical history, past social history, past surgical history and problem list. Problem list updated.  Objective:   Vitals:   07/12/17 0921  BP: (!) 110/56  Pulse: 88  Weight: 288 lb (130.6 kg)    Fetal Status: Fetal Heart Rate (bpm): 154   Movement: Present     General:  Alert, oriented and cooperative. Patient is in no acute distress.  Skin: Skin is warm and dry. No rash noted.   Cardiovascular: Normal heart rate noted  Respiratory: Normal respiratory effort, no problems with respiration noted  Abdomen: Soft, gravid, appropriate for gestational age.  Pain/Pressure: Absent     Pelvic: Cervical exam deferred        Extremities: Normal range of motion.  Edema: None  Mental Status:  Normal mood and affect. Normal behavior. Normal judgment and thought content.   Assessment and Plan:  Pregnancy: G3P1011 at 4315w5d  1. Supervision of other high risk pregnancy, antepartum  2. Obesity in pregnancy  3. gestational  diabetes mellitus (GDM) in second trimester Poorly controlled. Pt brought glucose ranges only for the past 2 days. She reports that other ranges were thrown away after her MAU visit. Her FBG yesterday was 195. Today 185 Her 2 hour PP was 260 and 189. No other values available but, she says they were worse. She was sent to the MAU prev die t poor glucose control and was sent home.   Current insulin regimen: Novalog: 20units tid with meals. Novolin 16 units bid  Recommended change: Novalog: 24 units tid with meals. Novolin 20 units bid.   (this is a change from what she was told in the ofc. She will be called with these new doses!)  Pt to call if her level drops below 60 at any point. Pt agrees to being seen weekly until her glucose is under better control.  I have explained to pt that we are not just worried about macrosomia but, other adverse pregnancy outcomes as well.   Reviewed dietary goals as well.      4. History of macrosomia in infant in prior pregnancy, currently pregnant  5. Hypertension affecting pregnancy in second trimester   6. Tobacco use disorder   Preterm labor symptoms and general obstetric precautions including but not limited to vaginal bleeding, contractions, leaking of fluid and fetal movement were reviewed in detail with the patient. Please refer to After Visit Summary for other counseling recommendations.  F/u in 1 week  Willodean Rosenthalarolyn Harraway-Smith, MD

## 2017-07-19 ENCOUNTER — Ambulatory Visit (INDEPENDENT_AMBULATORY_CARE_PROVIDER_SITE_OTHER): Payer: Medicaid Other | Admitting: Obstetrics & Gynecology

## 2017-07-19 DIAGNOSIS — O09893 Supervision of other high risk pregnancies, third trimester: Secondary | ICD-10-CM

## 2017-07-19 DIAGNOSIS — O09899 Supervision of other high risk pregnancies, unspecified trimester: Secondary | ICD-10-CM

## 2017-07-19 MED ORDER — RANITIDINE HCL 150 MG PO TABS: 150.0000 mg | ORAL_TABLET | Freq: Two times a day (BID) | ORAL | 4 refills | Status: DC

## 2017-07-19 MED ORDER — INSULIN NPH (HUMAN) (ISOPHANE) 100 UNIT/ML ~~LOC~~ SUSP: SUBCUTANEOUS | 3 refills | Status: DC

## 2017-07-19 MED ORDER — INSULIN ASPART 100 UNIT/ML ~~LOC~~ SOLN: SUBCUTANEOUS | 12 refills | Status: DC

## 2017-07-19 NOTE — Progress Notes (Signed)
     PRENATAL VISIT NOTE  Subjective:  Anna Jordan is a 26 y.o. G3P1011 at 5743w5d being seen today for ongoing prenatal care.  She is currently monitored for the following issues for this high-risk pregnancy and has Obesity in pregnancy; History of macrosomia in infant in prior pregnancy, currently pregnant; Chronic right-sided low back pain with right-sided sciatica; Generalized anxiety disorder; History of vertebral compression fracture; Tobacco use disorder; Gestational diabetes; Hypertension affecting pregnancy in second trimester; Supervision of other high risk pregnancy, antepartum; Pelvic pain affecting pregnancy in second trimester, antepartum; and Candida vaginitis on her problem list.  Patient reports heartburn adn problems contolling blood sugars.  Contractions: Irritability. Vag. Bleeding: None.  Movement: Present. Denies leaking of fluid.   The following portions of the patient's history were reviewed and updated as appropriate: allergies, current medications, past family history, past medical history, past social history, past surgical history and problem list. Problem list updated.  Objective:   Vitals:   07/19/17 1051  BP: 116/70  Weight: 285 lb (129.3 kg)    Fetal Status: Fetal Heart Rate (bpm): 146   Movement: Present     General:  Alert, oriented and cooperative. Patient is in no acute distress.  Skin: Skin is warm and dry. No rash noted.   Cardiovascular: Normal heart rate noted  Respiratory: Normal respiratory effort, no problems with respiration noted  Abdomen: Soft, gravid, appropriate for gestational age.  Pain/Pressure: Present     Pelvic: Cervical exam deferred        Extremities: Normal range of motion.  Edema: None  Mental Status:  Normal mood and affect. Normal behavior. Normal judgment and thought content.   Assessment and Plan:  Pregnancy: G3P1011 at 2243w5d  1. Supervision of other high risk pregnancy, antepartum - US MFM OB FOLLOW UP; Future (F/U  anatomy)   2.-Gestational DM--all CBGs are elvated.  Changes are as follows: NPH--20 units bid to 24 units bid  Novolog: Breakfast 24 Lunch 28 Dinner 28  Fetal echo scheduled.  3-Macrosomia--Get delivery note.  Pt says it was a difficult delivery (pushed 9 hours).  Still not arrived.  Re Requested  4-CHTN--BP stable.  Taking ASA  5-TSH--rpt next week  6-Total weight gain to be 12 pounds--doing well with this.  7-Psych--not having any problems; continue care with psychiatrist  8-Pulm--Asthma well controlled and has quit smoking!  9.  Reflux--Zantac prescribed  Preterm labor symptoms and general obstetric precautions including but not limited to vaginal bleeding, contractions, leaking of fluid and fetal movement were reviewed in detail with the patient. Please refer to After Visit Summary for other counseling recommendations.   RTC 1 week  Elsie LincolnKelly Alianys Chacko, MD

## 2017-07-26 ENCOUNTER — Ambulatory Visit (INDEPENDENT_AMBULATORY_CARE_PROVIDER_SITE_OTHER): Payer: Medicaid Other | Admitting: Obstetrics & Gynecology

## 2017-07-26 ENCOUNTER — Other Ambulatory Visit: Payer: Self-pay | Admitting: Obstetrics & Gynecology

## 2017-07-26 ENCOUNTER — Ambulatory Visit (HOSPITAL_COMMUNITY)
Admission: RE | Admit: 2017-07-26 | Discharge: 2017-07-26 | Disposition: A | Discharge status: Home / Self Care | Payer: Medicaid Other | Source: Ambulatory Visit | Attending: Obstetrics & Gynecology | Admitting: Obstetrics & Gynecology

## 2017-07-26 VITALS — BP 122/76 | Wt 284.0 lb

## 2017-07-26 DIAGNOSIS — Z3A27 27 weeks gestation of pregnancy: Secondary | ICD-10-CM | POA: Insufficient documentation

## 2017-07-26 DIAGNOSIS — Z3482 Encounter for supervision of other normal pregnancy, second trimester: Secondary | ICD-10-CM

## 2017-07-26 DIAGNOSIS — Z362 Encounter for other antenatal screening follow-up: Secondary | ICD-10-CM | POA: Insufficient documentation

## 2017-07-26 DIAGNOSIS — O24414 Gestational diabetes mellitus in pregnancy, insulin controlled: Secondary | ICD-10-CM | POA: Insufficient documentation

## 2017-07-26 DIAGNOSIS — O10012 Pre-existing essential hypertension complicating pregnancy, second trimester: Secondary | ICD-10-CM

## 2017-07-26 DIAGNOSIS — O99332 Smoking (tobacco) complicating pregnancy, second trimester: Secondary | ICD-10-CM

## 2017-07-26 DIAGNOSIS — Z3400 Encounter for supervision of normal first pregnancy, unspecified trimester: Secondary | ICD-10-CM

## 2017-07-26 DIAGNOSIS — Z23 Encounter for immunization: Secondary | ICD-10-CM | POA: Diagnosis not present

## 2017-07-26 DIAGNOSIS — O09299 Supervision of pregnancy with other poor reproductive or obstetric history, unspecified trimester: Secondary | ICD-10-CM | POA: Diagnosis not present

## 2017-07-26 DIAGNOSIS — O09899 Supervision of other high risk pregnancies, unspecified trimester: Secondary | ICD-10-CM

## 2017-07-26 MED ORDER — INSULIN ASPART 100 UNIT/ML ~~LOC~~ SOLN: SUBCUTANEOUS | 12 refills | Status: DC

## 2017-07-26 MED ORDER — ESOMEPRAZOLE MAGNESIUM 20 MG PO PACK: 20.0000 mg | PACK | Freq: Every day | ORAL | 12 refills | Status: DC

## 2017-07-26 MED ORDER — INSULIN NPH (HUMAN) (ISOPHANE) 100 UNIT/ML ~~LOC~~ SUSP: SUBCUTANEOUS | 3 refills | Status: DC

## 2017-07-26 NOTE — Progress Notes (Signed)
   PRENATAL VISIT NOTE  Subjective:  Kathryne HitchBrandi Fehrman is a 26 y.o. G3P1011 at 1087w5d being seen today for ongoing prenatal care.  She is currently monitored for the following issues for this high-risk pregnancy and has Obesity in pregnancy; History of macrosomia in infant in prior pregnancy, currently pregnant; Chronic right-sided low back pain with right-sided sciatica; Generalized anxiety disorder; History of vertebral compression fracture; Tobacco use disorder; Gestational diabetes; Hypertension affecting pregnancy in second trimester; Supervision of other high risk pregnancy, antepartum; Pelvic pain affecting pregnancy in second trimester, antepartum; and Candida vaginitis on her problem list.  Patient reports no complaints.  Contractions: Irritability. Vag. Bleeding: None.  Movement: Present. Denies leaking of fluid.   The following portions of the patient's history were reviewed and updated as appropriate: allergies, current medications, past family history, past medical history, past social history, past surgical history and problem list. Problem list updated.  Objective:   Vitals:   07/26/17 1112  BP: 122/76  Weight: 284 lb (128.8 kg)    Fetal Status:     Movement: Present     General:  Alert, oriented and cooperative. Patient is in no acute distress.  Skin: Skin is warm and dry. No rash noted.   Cardiovascular: Normal heart rate noted  Respiratory: Normal respiratory effort, no problems with respiration noted  Abdomen: Soft, gravid, appropriate for gestational age.  Pain/Pressure: Present     Pelvic: Cervical exam deferred        Extremities: Normal range of motion.  Edema: None  Mental Status:  Normal mood and affect. Normal behavior. Normal judgment and thought content.   Assessment and Plan:  Pregnancy: G3P1011 at 3887w5d  1. Supervision of normal first pregnancy, antepartum - TSH - Tdap vaccine greater than or equal to 7yo IM - HIV antibody (with reflex) - CBC -  RPR  2.-Gestational DM-- Better control than last week.  Fastings still high 110s-130s.  PP lunch and dinner elevated Growth US today.  Novolog: Breakfast 24 Lunch 30 Dinner 30  NPH 24 in am and 30 in pm Fetal echo scheduled.  3-Macrosomia--Get delivery note. Pt says it was a difficult delivery (pushed 9 hours).  Still not arrived.  [place] Requested ([place]  4-CHTN--BP stable.  Taking ASA  5-TSH today  6-Total weight gain to be 12 pounds--doing well with this.  7-Psych--not having any problems; continue care with psychiatrist  8-Pulm--Asthma well controlled and has quit smoking!  9.  Reflux--Zantac prescribed did not work.  Rx Nexium  Preterm labor symptoms and general obstetric precautions including but not limited to vaginal bleeding, contractions, leaking of fluid and fetal movement were reviewed in detail with the patient. Please refer to After Visit Summary for other counseling recommendations.   RTC 1 week  Preterm labor symptoms and general obstetric precautions including but not limited to vaginal bleeding, contractions, leaking of fluid and fetal movement were reviewed in detail with the patient. Please refer to After Visit Summary for other counseling recommendations.  No Follow-up on file.   Elsie LincolnKelly Johnay Mano, MD

## 2017-07-27 ENCOUNTER — Telehealth: Payer: Self-pay

## 2017-07-27 LAB — CBC
HEMATOCRIT: 37.3 % (ref 35.0–45.0)
Hemoglobin: 12.5 g/dL (ref 11.7–15.5)
MCH: 28.4 pg (ref 27.0–33.0)
MCHC: 33.5 g/dL (ref 32.0–36.0)
MCV: 84.8 fL (ref 80.0–100.0)
MPV: 10.7 fL (ref 7.5–12.5)
Platelets: 292 10*3/uL (ref 140–400)
RBC: 4.4 10*6/uL (ref 3.80–5.10)
RDW: 14.3 % (ref 11.0–15.0)
WBC: 12 10*3/uL — ABNORMAL HIGH (ref 3.8–10.8)

## 2017-07-27 LAB — HIV ANTIBODY (ROUTINE TESTING W REFLEX): HIV: NONREACTIVE

## 2017-07-27 LAB — RPR: RPR Ser Ql: NONREACTIVE

## 2017-07-27 LAB — TSH: TSH: 2.57 m[IU]/L

## 2017-07-27 NOTE — Telephone Encounter (Signed)
Left message on pt's phone letting her know that she is not anemic and her TSH is normal. Also told pt that her ultrasound showed appropriate growth and we will continue to do them every 4 weeks.

## 2017-07-31 ENCOUNTER — Other Ambulatory Visit: Payer: Self-pay | Admitting: *Deleted

## 2017-07-31 ENCOUNTER — Encounter: Payer: Self-pay | Admitting: Obstetrics & Gynecology

## 2017-07-31 DIAGNOSIS — O24419 Gestational diabetes mellitus in pregnancy, unspecified control: Secondary | ICD-10-CM

## 2017-07-31 MED ORDER — ACCU-CHEK SMARTVIEW VI STRP: 1.0000 | ORAL_STRIP | Freq: Four times a day (QID) | 6 refills | Status: DC

## 2017-07-31 NOTE — Telephone Encounter (Signed)
F for glucose test strips sent to pts pharmacy.

## 2017-08-02 ENCOUNTER — Encounter: Payer: Self-pay | Admitting: Obstetrics & Gynecology

## 2017-08-02 ENCOUNTER — Ambulatory Visit (INDEPENDENT_AMBULATORY_CARE_PROVIDER_SITE_OTHER): Payer: Medicaid Other | Admitting: Obstetrics & Gynecology

## 2017-08-02 VITALS — BP 124/78 | Wt 288.0 lb

## 2017-08-02 DIAGNOSIS — O09892 Supervision of other high risk pregnancies, second trimester: Secondary | ICD-10-CM | POA: Diagnosis not present

## 2017-08-02 DIAGNOSIS — O09899 Supervision of other high risk pregnancies, unspecified trimester: Secondary | ICD-10-CM

## 2017-08-02 DIAGNOSIS — O162 Unspecified maternal hypertension, second trimester: Secondary | ICD-10-CM

## 2017-08-02 DIAGNOSIS — Z23 Encounter for immunization: Secondary | ICD-10-CM | POA: Diagnosis not present

## 2017-08-02 DIAGNOSIS — O24419 Gestational diabetes mellitus in pregnancy, unspecified control: Secondary | ICD-10-CM

## 2017-08-02 MED ORDER — INSULIN NPH (HUMAN) (ISOPHANE) 100 UNIT/ML ~~LOC~~ SUSP: SUBCUTANEOUS | 3 refills | Status: DC

## 2017-08-02 MED ORDER — "INSULIN SYRINGE 31G X 5/16"" 0.3 ML MISC": 10 refills | Status: DC

## 2017-08-02 MED ORDER — ACCU-CHEK SMARTVIEW VI STRP: 1.0000 | ORAL_STRIP | Freq: Four times a day (QID) | 6 refills | Status: DC

## 2017-08-02 MED ORDER — INSULIN ASPART 100 UNIT/ML ~~LOC~~ SOLN: SUBCUTANEOUS | 12 refills | Status: DC

## 2017-08-02 MED ORDER — ACCU-CHEK FASTCLIX LANCETS MISC: 12 refills | Status: DC

## 2017-08-02 NOTE — Progress Notes (Signed)
Fastings mostly 90s to 105. Pp Breakfast-all normal Pp lunch--all normal  Pp dinner-all nml except one night when she ate rice--169  Sleep--benadryl    PRENATAL VISIT NOTE  Subjective:  Anna Jordan is a 26 y.o. G3P1011 at [redacted]w[redacted]d being seen today for ongoing prenatal care.  She is currently monitored for the following issues for this high-risk pregnancy and has Obesity in pregnancy; History of macrosomia in infant in prior pregnancy, currently pregnant; Chronic right-sided low back pain with right-sided sciatica; Generalized anxiety disorder; History of vertebral compression fracture; Tobacco use disorder; Gestational diabetes; Hypertension affecting pregnancy in second trimester; Supervision of other high risk pregnancy, antepartum; Pelvic pain affecting pregnancy in second trimester, antepartum; and Candida vaginitis on her problem list.  Patient reports no complaints.  Contractions: Irritability. Vag. Bleeding: None.  Movement: Present. Denies leaking of fluid.   The following portions of the patient's history were reviewed and updated as appropriate: allergies, current medications, past family history, past medical history, past social history, past surgical history and problem list. Problem list updated.  Objective:   Vitals:   08/02/17 1025  BP: 124/78  Weight: 288 lb (130.6 kg)    Fetal Status: Fetal Heart Rate (bpm): 144   Movement: Present     General:  Alert, oriented and cooperative. Patient is in no acute distress.  Skin: Skin is warm and dry. No rash noted.   Cardiovascular: Normal heart rate noted  Respiratory: Normal respiratory effort, no problems with respiration noted  Abdomen: Soft, gravid, appropriate for gestational age.  Pain/Pressure: Present     Pelvic: Cervical exam deferred        Extremities: Normal range of motion.  Edema: None  Mental Status:  Normal mood and affect. Normal behavior. Normal judgment and thought content.   Assessment and Plan:    Pregnancy: G3P1011 at [redacted]w[redacted]d  1. Needs flu shot - Flu Vaccine QUAD 36+ mos IM  2. Supervision of other high risk pregnancy, antepartum Flu shot  3. Gestational diabetes mellitus (GDM) in second trimester, gestational diabetes method of control unspecified Fastings mostly 90s to 81. Pp Breakfast-all normal Pp lunch--all normal  Pp dinner-all nml except one night when she ate rice--169  Increase NPH at night to control fasting CBGs MFM growth [place] q 4 weeks Testing at 32 week  - ACCU-CHEK SMARTVIEW test strip; 1 each by Other route 4 (four) times daily. Use as instructed  Dispense: 50 each; Refill: 6  4. Hypertension affecting pregnancy in second trimester Normotensive today  5.  Difficulty Sleeping Sleep--benadryl  Preterm labor symptoms and general obstetric precautions including but not limited to vaginal bleeding, contractions, leaking of fluid and fetal movement were reviewed in detail with the patient. Please refer to After Visit Summary for other counseling recommendations.  Return in about 2 weeks (around 08/16/2017).   Elsie Lincoln, MD

## 2017-08-08 ENCOUNTER — Other Ambulatory Visit: Payer: Self-pay | Admitting: Obstetrics & Gynecology

## 2017-08-08 ENCOUNTER — Encounter: Payer: Self-pay | Admitting: Obstetrics & Gynecology

## 2017-08-08 MED ORDER — METFORMIN HCL 500 MG PO TABS: 500.0000 mg | ORAL_TABLET | Freq: Every day | ORAL | 2 refills | Status: DC

## 2017-08-08 NOTE — Progress Notes (Unsigned)
Pt states that fsting CBGs are elevate to 155 still.  Would like to try metformin.  Rx sent to pharmacy [facility].  RN to call.

## 2017-08-14 ENCOUNTER — Other Ambulatory Visit: Payer: Self-pay | Admitting: *Deleted

## 2017-08-14 DIAGNOSIS — O10919 Unspecified pre-existing hypertension complicating pregnancy, unspecified trimester: Secondary | ICD-10-CM

## 2017-08-14 MED ORDER — LABETALOL HCL 200 MG PO TABS: 400.0000 mg | ORAL_TABLET | Freq: Two times a day (BID) | ORAL | 3 refills | Status: DC

## 2017-08-14 NOTE — Telephone Encounter (Signed)
RF request for Labetalol sent to [facility] in [place].

## 2017-08-16 ENCOUNTER — Telehealth: Payer: Self-pay | Admitting: *Deleted

## 2017-08-16 ENCOUNTER — Encounter: Payer: Self-pay | Admitting: Obstetrics & Gynecology

## 2017-08-16 DIAGNOSIS — O10919 Unspecified pre-existing hypertension complicating pregnancy, unspecified trimester: Secondary | ICD-10-CM

## 2017-08-16 MED ORDER — LABETALOL HCL 200 MG PO TABS: 400.0000 mg | ORAL_TABLET | Freq: Two times a day (BID) | ORAL | 3 refills | Status: DC

## 2017-08-16 NOTE — Telephone Encounter (Signed)
Resent RF authorization to [facility] in [place] for Labetalol as the first one was sent to [facility].

## 2017-08-17 ENCOUNTER — Ambulatory Visit (INDEPENDENT_AMBULATORY_CARE_PROVIDER_SITE_OTHER): Payer: Medicaid Other | Admitting: Obstetrics & Gynecology

## 2017-08-17 VITALS — BP 124/82 | Wt 287.0 lb

## 2017-08-17 DIAGNOSIS — O24414 Gestational diabetes mellitus in pregnancy, insulin controlled: Secondary | ICD-10-CM

## 2017-08-17 DIAGNOSIS — O24410 Gestational diabetes mellitus in pregnancy, diet controlled: Secondary | ICD-10-CM | POA: Diagnosis not present

## 2017-08-17 DIAGNOSIS — O09899 Supervision of other high risk pregnancies, unspecified trimester: Secondary | ICD-10-CM

## 2017-08-17 DIAGNOSIS — O09893 Supervision of other high risk pregnancies, third trimester: Secondary | ICD-10-CM

## 2017-08-17 MED ORDER — INSULIN NPH (HUMAN) (ISOPHANE) 100 UNIT/ML ~~LOC~~ SUSP: SUBCUTANEOUS | 3 refills | Status: DC

## 2017-08-17 NOTE — Progress Notes (Signed)
   PRENATAL VISIT NOTE  Subjective:  Anna Jordan is a 26 y.o. G3P1011 at [redacted]w[redacted]d being seen today for ongoing prenatal care.  She is currently monitored for the following issues for this high-risk pregnancy and has Obesity in pregnancy; History of macrosomia in infant in prior pregnancy, currently pregnant; Chronic right-sided low back pain with right-sided sciatica; Generalized anxiety disorder; History of vertebral compression fracture; Tobacco use disorder; Gestational diabetes; Hypertension affecting pregnancy in second trimester; Supervision of other high risk pregnancy, antepartum; Pelvic pain affecting pregnancy in second trimester, antepartum; and Candida vaginitis on her problem list.  Patient reports nausea.  Contractions: Irritability. Vag. Bleeding: None.  Movement: Present. Denies leaking of fluid.   The following portions of the patient's history were reviewed and updated as appropriate: allergies, current medications, past family history, past medical history, past social history, past surgical history and problem list. Problem list updated.  Objective:   Vitals:   08/17/17 1433  BP: 124/82  Weight: 287 lb (130.2 kg)    Fetal Status:     Movement: Present     General:  Alert, oriented and cooperative. Patient is in no acute distress.  Skin: Skin is warm and dry. No rash noted.   Cardiovascular: Normal heart rate noted  Respiratory: Normal respiratory effort, no problems with respiration noted  Abdomen: Soft, gravid, appropriate for gestational age.  Pain/Pressure: Present     Pelvic: Cervical exam deferred        Extremities: Normal range of motion.  Edema: Trace  Mental Status:  Normal mood and affect. Normal behavior. Normal judgment and thought content.   Assessment and Plan:  Pregnancy: G3P1011 at [redacted]w[redacted]d  1. Diet controlled gestational diabetes mellitus (GDM), antepartum fasting still elevated even with metformin.  Increase NPH at night to 33. Fetal echo  tomorrow; growth [place] on 10/11; antenatal testinga t 32 weeks.  2. Supervision of other high risk pregnancy, antepartum Continue diglegis for nausea./ vomiting.    Preterm labor symptoms and general obstetric precautions including but not limited to vaginal bleeding, contractions, leaking of fluid and fetal movement were reviewed in detail with the patient. Please refer to After Visit Summary for other counseling recommendations.  Return in about 10 days (around 08/27/2017).   Elsie Lincoln, MD

## 2017-08-22 ENCOUNTER — Encounter: Payer: Self-pay | Admitting: Obstetrics & Gynecology

## 2017-08-24 ENCOUNTER — Encounter: Payer: Self-pay | Admitting: Obstetrics & Gynecology

## 2017-08-24 ENCOUNTER — Other Ambulatory Visit: Payer: Self-pay | Admitting: Obstetrics & Gynecology

## 2017-08-24 ENCOUNTER — Ambulatory Visit (HOSPITAL_COMMUNITY)
Admission: RE | Admit: 2017-08-24 | Discharge: 2017-08-24 | Disposition: A | Discharge status: Home / Self Care | Payer: Medicaid Other | Source: Ambulatory Visit | Attending: Certified Nurse Midwife | Admitting: Certified Nurse Midwife

## 2017-08-24 ENCOUNTER — Other Ambulatory Visit: Payer: Self-pay | Admitting: Certified Nurse Midwife

## 2017-08-24 DIAGNOSIS — O162 Unspecified maternal hypertension, second trimester: Secondary | ICD-10-CM

## 2017-08-24 DIAGNOSIS — Z6841 Body Mass Index (BMI) 40.0 and over, adult: Secondary | ICD-10-CM | POA: Insufficient documentation

## 2017-08-24 DIAGNOSIS — O99331 Smoking (tobacco) complicating pregnancy, first trimester: Secondary | ICD-10-CM | POA: Diagnosis not present

## 2017-08-24 DIAGNOSIS — O24414 Gestational diabetes mellitus in pregnancy, insulin controlled: Secondary | ICD-10-CM

## 2017-08-24 DIAGNOSIS — O10913 Unspecified pre-existing hypertension complicating pregnancy, third trimester: Secondary | ICD-10-CM | POA: Insufficient documentation

## 2017-08-24 DIAGNOSIS — O24419 Gestational diabetes mellitus in pregnancy, unspecified control: Secondary | ICD-10-CM

## 2017-08-24 DIAGNOSIS — Z3A31 31 weeks gestation of pregnancy: Secondary | ICD-10-CM | POA: Insufficient documentation

## 2017-08-24 DIAGNOSIS — O99210 Obesity complicating pregnancy, unspecified trimester: Secondary | ICD-10-CM | POA: Diagnosis not present

## 2017-08-24 DIAGNOSIS — O10919 Unspecified pre-existing hypertension complicating pregnancy, unspecified trimester: Secondary | ICD-10-CM

## 2017-08-27 ENCOUNTER — Encounter: Payer: Self-pay | Admitting: Obstetrics & Gynecology

## 2017-08-28 ENCOUNTER — Other Ambulatory Visit: Payer: Self-pay | Admitting: *Deleted

## 2017-08-28 DIAGNOSIS — O0992 Supervision of high risk pregnancy, unspecified, second trimester: Secondary | ICD-10-CM

## 2017-08-28 MED ORDER — PRENATAL VITAMIN 27-0.8 MG PO TABS: 1.0000 | ORAL_TABLET | Freq: Every day | ORAL | 12 refills | Status: DC

## 2017-08-30 ENCOUNTER — Ambulatory Visit (INDEPENDENT_AMBULATORY_CARE_PROVIDER_SITE_OTHER): Payer: Medicaid Other | Admitting: Advanced Practice Midwife

## 2017-08-30 ENCOUNTER — Other Ambulatory Visit: Payer: Self-pay | Admitting: *Deleted

## 2017-08-30 VITALS — BP 131/68 | Wt 288.0 lb

## 2017-08-30 DIAGNOSIS — O162 Unspecified maternal hypertension, second trimester: Secondary | ICD-10-CM

## 2017-08-30 DIAGNOSIS — O163 Unspecified maternal hypertension, third trimester: Secondary | ICD-10-CM | POA: Diagnosis not present

## 2017-08-30 DIAGNOSIS — O09893 Supervision of other high risk pregnancies, third trimester: Secondary | ICD-10-CM | POA: Diagnosis not present

## 2017-08-30 DIAGNOSIS — O219 Vomiting of pregnancy, unspecified: Secondary | ICD-10-CM

## 2017-08-30 DIAGNOSIS — O09899 Supervision of other high risk pregnancies, unspecified trimester: Secondary | ICD-10-CM

## 2017-08-30 DIAGNOSIS — O24410 Gestational diabetes mellitus in pregnancy, diet controlled: Secondary | ICD-10-CM

## 2017-08-30 MED ORDER — DOXYLAMINE-PYRIDOXINE 10-10 MG PO TBEC: 2.0000 | DELAYED_RELEASE_TABLET | Freq: Every day | ORAL | 2 refills | Status: DC

## 2017-08-30 NOTE — Patient Instructions (Signed)
Third Trimester of Pregnancy The third trimester is from week 28 through week 40 (months 7 through 9). The third trimester is a time when the unborn baby (fetus) is growing rapidly. At the end of the ninth month, the fetus is about 20 inches in length and weighs 6-10 pounds. Body changes during your third trimester Your body will continue to go through many changes during pregnancy. The changes vary from woman to woman. During the third trimester:  Your weight will continue to increase. You can expect to gain 25-35 pounds (11-16 kg) by the end of the pregnancy.  You may begin to get stretch marks on your hips, abdomen, and breasts.  You may urinate more often because the fetus is moving lower into your pelvis and pressing on your bladder.  You may develop or continue to have heartburn. This is caused by increased hormones that slow down muscles in the digestive tract.  You may develop or continue to have constipation because increased hormones slow digestion and cause the muscles that push waste through your intestines to relax.  You may develop hemorrhoids. These are swollen veins (varicose veins) in the rectum that can itch or be painful.  You may develop swollen, bulging veins (varicose veins) in your legs.  You may have increased body aches in the pelvis, back, or thighs. This is due to weight gain and increased hormones that are relaxing your joints.  You may have changes in your hair. These can include thickening of your hair, rapid growth, and changes in texture. Some women also have hair loss during or after pregnancy, or hair that feels dry or thin. Your hair will most likely return to normal after your baby is born.  Your breasts will continue to grow and they will continue to become tender. A yellow fluid (colostrum) may leak from your breasts. This is the first milk you are producing for your baby.  Your belly button may stick out.  You may notice more swelling in your hands,  face, or ankles.  You may have increased tingling or numbness in your hands, arms, and legs. The skin on your belly may also feel numb.  You may feel short of breath because of your expanding uterus.  You may have more problems sleeping. This can be caused by the size of your belly, increased need to urinate, and an increase in your body's metabolism.  You may notice the fetus "dropping," or moving lower in your abdomen (lightening).  You may have increased vaginal discharge.  You may notice your joints feel loose and you may have pain around your pelvic bone.  What to expect at prenatal visits You will have prenatal exams every 2 weeks until week 36. Then you will have weekly prenatal exams. During a routine prenatal visit:  You will be weighed to make sure you and the baby are growing normally.  Your blood pressure will be taken.  Your abdomen will be measured to track your baby's growth.  The fetal heartbeat will be listened to.  Any test results from the previous visit will be discussed.  You may have a cervical check near your due date to see if your cervix has softened or thinned (effaced).  You will be tested for Group B streptococcus. This happens between 35 and 37 weeks.  Your health care provider may ask you:  What your birth plan is.  How you are feeling.  If you are feeling the baby move.  If you have had   any abnormal symptoms, such as leaking fluid, bleeding, severe headaches, or abdominal cramping.  If you are using any tobacco products, including cigarettes, chewing tobacco, and electronic cigarettes.  If you have any questions.  Other tests or screenings that may be performed during your third trimester include:  Blood tests that check for low iron levels (anemia).  Fetal testing to check the health, activity level, and growth of the fetus. Testing is done if you have certain medical conditions or if there are problems during the  pregnancy.  Nonstress test (NST). This test checks the health of your baby to make sure there are no signs of problems, such as the baby not getting enough oxygen. During this test, a belt is placed around your belly. The baby is made to move, and its heart rate is monitored during movement.  What is false labor? False labor is a condition in which you feel small, irregular tightenings of the muscles in the womb (contractions) that usually go away with rest, changing position, or drinking water. These are called Braxton Hicks contractions. Contractions may last for hours, days, or even weeks before true labor sets in. If contractions come at regular intervals, become more frequent, increase in intensity, or become painful, you should see your health care provider. What are the signs of labor?  Abdominal cramps.  Regular contractions that start at 10 minutes apart and become stronger and more frequent with time.  Contractions that start on the top of the uterus and spread down to the lower abdomen and back.  Increased pelvic pressure and dull back pain.  A watery or bloody mucus discharge that comes from the vagina.  Leaking of amniotic fluid. This is also known as your "water breaking." It could be a slow trickle or a gush. Let your health care provider know if it has a color or strange odor. If you have any of these signs, call your health care provider right away, even if it is before your due date. Follow these instructions at home: Medicines  Follow your health care provider's instructions regarding medicine use. Specific medicines may be either safe or unsafe to take during pregnancy.  Take a prenatal vitamin that contains at least 600 micrograms (mcg) of folic acid.  If you develop constipation, try taking a stool softener if your health care provider approves. Eating and drinking  Eat a balanced diet that includes fresh fruits and vegetables, whole grains, good sources of protein  such as meat, eggs, or tofu, and low-fat dairy. Your health care provider will help you determine the amount of weight gain that is right for you.  Avoid raw meat and uncooked cheese. These carry germs that can cause birth defects in the baby.  If you have low calcium intake from food, talk to your health care provider about whether you should take a daily calcium supplement.  Eat four or five small meals rather than three large meals a day.  Limit foods that are high in fat and processed sugars, such as fried and sweet foods.  To prevent constipation: ? Drink enough fluid to keep your urine clear or pale yellow. ? Eat foods that are high in fiber, such as fresh fruits and vegetables, whole grains, and beans. Activity  Exercise only as directed by your health care provider. Most women can continue their usual exercise routine during pregnancy. Try to exercise for 30 minutes at least 5 days a week. Stop exercising if you experience uterine contractions.  Avoid heavy   lifting.  Do not exercise in extreme heat or humidity, or at high altitudes.  Wear low-heel, comfortable shoes.  Practice good posture.  You may continue to have sex unless your health care provider tells you otherwise. Relieving pain and discomfort  Take frequent breaks and rest with your legs elevated if you have leg cramps or low back pain.  Take warm sitz baths to soothe any pain or discomfort caused by hemorrhoids. Use hemorrhoid cream if your health care provider approves.  Wear a good support bra to prevent discomfort from breast tenderness.  If you develop varicose veins: ? Wear support pantyhose or compression stockings as told by your healthcare provider. ? Elevate your feet for 15 minutes, 3-4 times a day. Prenatal care  Write down your questions. Take them to your prenatal visits.  Keep all your prenatal visits as told by your health care provider. This is important. Safety  Wear your seat belt at  all times when driving.  Make a list of emergency phone numbers, including numbers for family, friends, the hospital, and police and fire departments. General instructions  Avoid cat litter boxes and soil used by cats. These carry germs that can cause birth defects in the baby. If you have a cat, ask someone to clean the litter box for you.  Do not travel far distances unless it is absolutely necessary and only with the approval of your health care provider.  Do not use hot tubs, steam rooms, or saunas.  Do not drink alcohol.  Do not use any products that contain nicotine or tobacco, such as cigarettes and e-cigarettes. If you need help quitting, ask your health care provider.  Do not use any medicinal herbs or unprescribed drugs. These chemicals affect the formation and growth of the baby.  Do not douche or use tampons or scented sanitary pads.  Do not cross your legs for long periods of time.  To prepare for the arrival of your baby: ? Take prenatal classes to understand, practice, and ask questions about labor and delivery. ? Make a trial run to the hospital. ? Visit the hospital and tour the maternity area. ? Arrange for maternity or paternity leave through employers. ? Arrange for family and friends to take care of pets while you are in the hospital. ? Purchase a rear-facing car seat and make sure you know how to install it in your car. ? Pack your hospital bag. ? Prepare the baby's nursery. Make sure to remove all pillows and stuffed animals from the baby's crib to prevent suffocation.  Visit your dentist if you have not gone during your pregnancy. Use a soft toothbrush to brush your teeth and be gentle when you floss. Contact a health care provider if:  You are unsure if you are in labor or if your water has broken.  You become dizzy.  You have mild pelvic cramps, pelvic pressure, or nagging pain in your abdominal area.  You have lower back pain.  You have persistent  nausea, vomiting, or diarrhea.  You have an unusual or bad smelling vaginal discharge.  You have pain when you urinate. Get help right away if:  Your water breaks before 37 weeks.  You have regular contractions less than 5 minutes apart before 37 weeks.  You have a fever.  You are leaking fluid from your vagina.  You have spotting or bleeding from your vagina.  You have severe abdominal pain or cramping.  You have rapid weight loss or weight gain.    You have shortness of breath with chest pain.  You notice sudden or extreme swelling of your face, hands, ankles, feet, or legs.  Your baby makes fewer than 10 movements in 2 hours.  You have severe headaches that do not go away when you take medicine.  You have vision changes. Summary  The third trimester is from week 28 through week 40, months 7 through 9. The third trimester is a time when the unborn baby (fetus) is growing rapidly.  During the third trimester, your discomfort may increase as you and your baby continue to gain weight. You may have abdominal, leg, and back pain, sleeping problems, and an increased need to urinate.  During the third trimester your breasts will keep growing and they will continue to become tender. A yellow fluid (colostrum) may leak from your breasts. This is the first milk you are producing for your baby.  False labor is a condition in which you feel small, irregular tightenings of the muscles in the womb (contractions) that eventually go away. These are called Braxton Hicks contractions. Contractions may last for hours, days, or even weeks before true labor sets in.  Signs of labor can include: abdominal cramps; regular contractions that start at 10 minutes apart and become stronger and more frequent with time; watery or bloody mucus discharge that comes from the vagina; increased pelvic pressure and dull back pain; and leaking of amniotic fluid. This information is not intended to replace advice  given to you by your health care provider. Make sure you discuss any questions you have with your health care provider. Document Released: 10/25/2001 Document Revised: 04/07/2016 Document Reviewed: 01/01/2013 [employer] Interactive Patient Education  2017 [employer]  

## 2017-08-30 NOTE — Progress Notes (Signed)
   PRENATAL VISIT NOTE  Subjective:  Anna Jordan is a 26 y.o. G3P1011 at 6029w5d being seen today for ongoing prenatal care.  She is currently monitored for the following issues for this high-risk pregnancy and has Obesity in pregnancy; History of macrosomia in infant in prior pregnancy, currently pregnant; Chronic right-sided low back pain with right-sided sciatica; Generalized anxiety disorder; History of vertebral compression fracture; Tobacco use disorder; Gestational diabetes; Hypertension affecting pregnancy in second trimester; Supervision of other high risk pregnancy, antepartum; Pelvic pain affecting pregnancy in second trimester, antepartum; and Candida vaginitis on her problem list.  Patient reports FBSs running 90-115.  2hr PCs running under 120 except for one day when they were 150.  Feet swelling more today.  Contractions: Irritability. Vag. Bleeding: None.  Movement: Present. Denies leaking of fluid.   The following portions of the patient's history were reviewed and updated as appropriate: allergies, current medications, past family history, past medical history, past social history, past surgical history and problem list. Problem list updated.  Objective:   Vitals:   08/30/17 1037  BP: 131/68  Weight: 288 lb (130.6 kg)    Fetal Status: Fetal Heart Rate (bpm): 148   Movement: Present     General:  Alert, oriented and cooperative. Patient is in no acute distress.  Skin: Skin is warm and dry. No rash noted.   Cardiovascular: Normal heart rate noted  Respiratory: Normal respiratory effort, no problems with respiration noted  Abdomen: Soft, gravid, appropriate for gestational age.  Pain/Pressure: Present     Pelvic: Cervical exam deferred        Extremities: Normal range of motion.  Edema: Trace  Mental Status:  Normal mood and affect. Normal behavior. Normal judgment and thought content.   FBS 110-114-91-113 B    101-108-162-89-102-91 L    104-91-78-92 D      100-179-----  (179 and 162 were on same day, doesn't know why)  Assessment and Plan:  Pregnancy: G3P1011 at 4129w5d  Patient Active Problem List   Diagnosis Date Noted  . Pelvic pain affecting pregnancy in second trimester, antepartum 06/18/2017  . Candida vaginitis 06/18/2017  . Supervision of other high risk pregnancy, antepartum 06/09/2017  . Hypertension affecting pregnancy in second trimester 04/18/2017  . Gestational diabetes 03/17/2017  . Obesity in pregnancy 03/14/2017  . History of macrosomia in infant in prior pregnancy, currently pregnant 03/14/2017  . Chronic right-sided low back pain with right-sided sciatica 10/27/2016  . Generalized anxiety disorder 10/24/2016  . History of vertebral compression fracture 10/24/2016  . Tobacco use disorder 10/24/2016   Will increase nighttime insulin to 35u to improve FBS  Preterm labor symptoms and general obstetric precautions including but not limited to vaginal bleeding, contractions, leaking of fluid and fetal movement were reviewed in detail with the patient. Please refer to After Visit Summary for other counseling recommendations.   Will move to Mon-Thurs visit schedule  Wynelle BourgeoisMarie Williams, CNM

## 2017-08-31 ENCOUNTER — Encounter: Payer: Self-pay | Admitting: Obstetrics & Gynecology

## 2017-09-04 ENCOUNTER — Encounter: Payer: Self-pay | Admitting: Advanced Practice Midwife

## 2017-09-04 ENCOUNTER — Ambulatory Visit (INDEPENDENT_AMBULATORY_CARE_PROVIDER_SITE_OTHER): Payer: Medicaid Other | Admitting: Advanced Practice Midwife

## 2017-09-04 DIAGNOSIS — O24410 Gestational diabetes mellitus in pregnancy, diet controlled: Secondary | ICD-10-CM | POA: Diagnosis not present

## 2017-09-04 DIAGNOSIS — O0993 Supervision of high risk pregnancy, unspecified, third trimester: Secondary | ICD-10-CM | POA: Diagnosis not present

## 2017-09-04 NOTE — Patient Instructions (Signed)
Third Trimester of Pregnancy The third trimester is from week 28 through week 40 (months 7 through 9). The third trimester is a time when the unborn baby (fetus) is growing rapidly. At the end of the ninth month, the fetus is about 20 inches in length and weighs 6-10 pounds. Body changes during your third trimester Your body will continue to go through many changes during pregnancy. The changes vary from woman to woman. During the third trimester:  Your weight will continue to increase. You can expect to gain 25-35 pounds (11-16 kg) by the end of the pregnancy.  You may begin to get stretch marks on your hips, abdomen, and breasts.  You may urinate more often because the fetus is moving lower into your pelvis and pressing on your bladder.  You may develop or continue to have heartburn. This is caused by increased hormones that slow down muscles in the digestive tract.  You may develop or continue to have constipation because increased hormones slow digestion and cause the muscles that push waste through your intestines to relax.  You may develop hemorrhoids. These are swollen veins (varicose veins) in the rectum that can itch or be painful.  You may develop swollen, bulging veins (varicose veins) in your legs.  You may have increased body aches in the pelvis, back, or thighs. This is due to weight gain and increased hormones that are relaxing your joints.  You may have changes in your hair. These can include thickening of your hair, rapid growth, and changes in texture. Some women also have hair loss during or after pregnancy, or hair that feels dry or thin. Your hair will most likely return to normal after your baby is born.  Your breasts will continue to grow and they will continue to become tender. A yellow fluid (colostrum) may leak from your breasts. This is the first milk you are producing for your baby.  Your belly button may stick out.  You may notice more swelling in your hands,  face, or ankles.  You may have increased tingling or numbness in your hands, arms, and legs. The skin on your belly may also feel numb.  You may feel short of breath because of your expanding uterus.  You may have more problems sleeping. This can be caused by the size of your belly, increased need to urinate, and an increase in your body's metabolism.  You may notice the fetus "dropping," or moving lower in your abdomen (lightening).  You may have increased vaginal discharge.  You may notice your joints feel loose and you may have pain around your pelvic bone.  What to expect at prenatal visits You will have prenatal exams every 2 weeks until week 36. Then you will have weekly prenatal exams. During a routine prenatal visit:  You will be weighed to make sure you and the baby are growing normally.  Your blood pressure will be taken.  Your abdomen will be measured to track your baby's growth.  The fetal heartbeat will be listened to.  Any test results from the previous visit will be discussed.  You may have a cervical check near your due date to see if your cervix has softened or thinned (effaced).  You will be tested for Group B streptococcus. This happens between 35 and 37 weeks.  Your health care provider may ask you:  What your birth plan is.  How you are feeling.  If you are feeling the baby move.  If you have had   any abnormal symptoms, such as leaking fluid, bleeding, severe headaches, or abdominal cramping.  If you are using any tobacco products, including cigarettes, chewing tobacco, and electronic cigarettes.  If you have any questions.  Other tests or screenings that may be performed during your third trimester include:  Blood tests that check for low iron levels (anemia).  Fetal testing to check the health, activity level, and growth of the fetus. Testing is done if you have certain medical conditions or if there are problems during the  pregnancy.  Nonstress test (NST). This test checks the health of your baby to make sure there are no signs of problems, such as the baby not getting enough oxygen. During this test, a belt is placed around your belly. The baby is made to move, and its heart rate is monitored during movement.  What is false labor? False labor is a condition in which you feel small, irregular tightenings of the muscles in the womb (contractions) that usually go away with rest, changing position, or drinking water. These are called Braxton Hicks contractions. Contractions may last for hours, days, or even weeks before true labor sets in. If contractions come at regular intervals, become more frequent, increase in intensity, or become painful, you should see your health care provider. What are the signs of labor?  Abdominal cramps.  Regular contractions that start at 10 minutes apart and become stronger and more frequent with time.  Contractions that start on the top of the uterus and spread down to the lower abdomen and back.  Increased pelvic pressure and dull back pain.  A watery or bloody mucus discharge that comes from the vagina.  Leaking of amniotic fluid. This is also known as your "water breaking." It could be a slow trickle or a gush. Let your health care provider know if it has a color or strange odor. If you have any of these signs, call your health care provider right away, even if it is before your due date. Follow these instructions at home: Medicines  Follow your health care provider's instructions regarding medicine use. Specific medicines may be either safe or unsafe to take during pregnancy.  Take a prenatal vitamin that contains at least 600 micrograms (mcg) of folic acid.  If you develop constipation, try taking a stool softener if your health care provider approves. Eating and drinking  Eat a balanced diet that includes fresh fruits and vegetables, whole grains, good sources of protein  such as meat, eggs, or tofu, and low-fat dairy. Your health care provider will help you determine the amount of weight gain that is right for you.  Avoid raw meat and uncooked cheese. These carry germs that can cause birth defects in the baby.  If you have low calcium intake from food, talk to your health care provider about whether you should take a daily calcium supplement.  Eat four or five small meals rather than three large meals a day.  Limit foods that are high in fat and processed sugars, such as fried and sweet foods.  To prevent constipation: ? Drink enough fluid to keep your urine clear or pale yellow. ? Eat foods that are high in fiber, such as fresh fruits and vegetables, whole grains, and beans. Activity  Exercise only as directed by your health care provider. Most women can continue their usual exercise routine during pregnancy. Try to exercise for 30 minutes at least 5 days a week. Stop exercising if you experience uterine contractions.  Avoid heavy   lifting.  Do not exercise in extreme heat or humidity, or at high altitudes.  Wear low-heel, comfortable shoes.  Practice good posture.  You may continue to have sex unless your health care provider tells you otherwise. Relieving pain and discomfort  Take frequent breaks and rest with your legs elevated if you have leg cramps or low back pain.  Take warm sitz baths to soothe any pain or discomfort caused by hemorrhoids. Use hemorrhoid cream if your health care provider approves.  Wear a good support bra to prevent discomfort from breast tenderness.  If you develop varicose veins: ? Wear support pantyhose or compression stockings as told by your healthcare provider. ? Elevate your feet for 15 minutes, 3-4 times a day. Prenatal care  Write down your questions. Take them to your prenatal visits.  Keep all your prenatal visits as told by your health care provider. This is important. Safety  Wear your seat belt at  all times when driving.  Make a list of emergency phone numbers, including numbers for family, friends, the hospital, and police and fire departments. General instructions  Avoid cat litter boxes and soil used by cats. These carry germs that can cause birth defects in the baby. If you have a cat, ask someone to clean the litter box for you.  Do not travel far distances unless it is absolutely necessary and only with the approval of your health care provider.  Do not use hot tubs, steam rooms, or saunas.  Do not drink alcohol.  Do not use any products that contain nicotine or tobacco, such as cigarettes and e-cigarettes. If you need help quitting, ask your health care provider.  Do not use any medicinal herbs or unprescribed drugs. These chemicals affect the formation and growth of the baby.  Do not douche or use tampons or scented sanitary pads.  Do not cross your legs for long periods of time.  To prepare for the arrival of your baby: ? Take prenatal classes to understand, practice, and ask questions about labor and delivery. ? Make a trial run to the hospital. ? Visit the hospital and tour the maternity area. ? Arrange for maternity or paternity leave through employers. ? Arrange for family and friends to take care of pets while you are in the hospital. ? Purchase a rear-facing car seat and make sure you know how to install it in your car. ? Pack your hospital bag. ? Prepare the baby's nursery. Make sure to remove all pillows and stuffed animals from the baby's crib to prevent suffocation.  Visit your dentist if you have not gone during your pregnancy. Use a soft toothbrush to brush your teeth and be gentle when you floss. Contact a health care provider if:  You are unsure if you are in labor or if your water has broken.  You become dizzy.  You have mild pelvic cramps, pelvic pressure, or nagging pain in your abdominal area.  You have lower back pain.  You have persistent  nausea, vomiting, or diarrhea.  You have an unusual or bad smelling vaginal discharge.  You have pain when you urinate. Get help right away if:  Your water breaks before 37 weeks.  You have regular contractions less than 5 minutes apart before 37 weeks.  You have a fever.  You are leaking fluid from your vagina.  You have spotting or bleeding from your vagina.  You have severe abdominal pain or cramping.  You have rapid weight loss or weight gain.    You have shortness of breath with chest pain.  You notice sudden or extreme swelling of your face, hands, ankles, feet, or legs.  Your baby makes fewer than 10 movements in 2 hours.  You have severe headaches that do not go away when you take medicine.  You have vision changes. Summary  The third trimester is from week 28 through week 40, months 7 through 9. The third trimester is a time when the unborn baby (fetus) is growing rapidly.  During the third trimester, your discomfort may increase as you and your baby continue to gain weight. You may have abdominal, leg, and back pain, sleeping problems, and an increased need to urinate.  During the third trimester your breasts will keep growing and they will continue to become tender. A yellow fluid (colostrum) may leak from your breasts. This is the first milk you are producing for your baby.  False labor is a condition in which you feel small, irregular tightenings of the muscles in the womb (contractions) that eventually go away. These are called Braxton Hicks contractions. Contractions may last for hours, days, or even weeks before true labor sets in.  Signs of labor can include: abdominal cramps; regular contractions that start at 10 minutes apart and become stronger and more frequent with time; watery or bloody mucus discharge that comes from the vagina; increased pelvic pressure and dull back pain; and leaking of amniotic fluid. This information is not intended to replace advice  given to you by your health care provider. Make sure you discuss any questions you have with your health care provider. Document Released: 10/25/2001 Document Revised: 04/07/2016 Document Reviewed: 01/01/2013 [employer] Interactive Patient Education  2017 [employer]  

## 2017-09-04 NOTE — Progress Notes (Signed)
   PRENATAL VISIT NOTE  Subjective:  Anna Jordan is a 26 y.o. G3P1011 at 8462w3d being seen today for ongoing prenatal care.  She is currently monitored for the following issues for this high-risk pregnancy and has Obesity in pregnancy; History of macrosomia in infant in prior pregnancy, currently pregnant; Chronic right-sided low back pain with right-sided sciatica; Generalized anxiety disorder; History of vertebral compression fracture; Tobacco use disorder; Gestational diabetes; Hypertension affecting pregnancy in second trimester; Supervision of other high risk pregnancy, antepartum; Pelvic pain affecting pregnancy in second trimester, antepartum; and Candida vaginitis on her problem list.  Patient reports improved foot edema, FBSs still 115-120 depite increased HS insulin, most postprandial BS normal except a few 130s..  Contractions: Irritability. Vag. Bleeding: None.  Movement: Present. Denies leaking of fluid.   The following portions of the patient's history were reviewed and updated as appropriate: allergies, current medications, past family history, past medical history, past social history, past surgical history and problem list. Problem list updated.  Objective:   Vitals:   09/04/17 1031  Weight: 287 lb (130.2 kg)    Fetal Status:     Movement: Present     General:  Alert, oriented and cooperative. Patient is in no acute distress.  Skin: Skin is warm and dry. No rash noted.   Cardiovascular: Normal heart rate noted  Respiratory: Normal respiratory effort, no problems with respiration noted  Abdomen: Soft, gravid, appropriate for gestational age.  Pain/Pressure: Present     Pelvic: Cervical exam deferred        Extremities: Normal range of motion.  Edema: Trace  Mental Status:  Normal mood and affect. Normal behavior. Normal judgment and thought content.   NST reactive  Assessment and Plan:  Pregnancy: G3P1011 at 3862w3d  1. Diet controlled gestational diabetes mellitus  (GDM), antepartum      D/W Dr Debroah LoopArnold.  Will increase HS NPH to 38units      ? Need for earlier US based on ?control of FBS (though other values mostly normal), ?class of diabetes  Preterm labor symptoms and general obstetric precautions including but not limited to vaginal bleeding, contractions, leaking of fluid and fetal movement were reviewed in detail with the patient. Please refer to After Visit Summary for other counseling recommendations.  Mon-Thurs monitoring/testing   Wynelle BourgeoisMarie Nakari Bracknell, CNM

## 2017-09-07 ENCOUNTER — Ambulatory Visit (INDEPENDENT_AMBULATORY_CARE_PROVIDER_SITE_OTHER): Payer: Medicaid Other | Admitting: *Deleted

## 2017-09-07 VITALS — Wt 285.0 lb

## 2017-09-07 DIAGNOSIS — O24410 Gestational diabetes mellitus in pregnancy, diet controlled: Secondary | ICD-10-CM

## 2017-09-11 ENCOUNTER — Ambulatory Visit (INDEPENDENT_AMBULATORY_CARE_PROVIDER_SITE_OTHER): Payer: Medicaid Other | Admitting: Advanced Practice Midwife

## 2017-09-11 VITALS — BP 123/60 | Wt 287.0 lb

## 2017-09-11 DIAGNOSIS — F17200 Nicotine dependence, unspecified, uncomplicated: Secondary | ICD-10-CM | POA: Diagnosis not present

## 2017-09-11 DIAGNOSIS — O09893 Supervision of other high risk pregnancies, third trimester: Secondary | ICD-10-CM | POA: Diagnosis not present

## 2017-09-11 DIAGNOSIS — E669 Obesity, unspecified: Secondary | ICD-10-CM | POA: Diagnosis not present

## 2017-09-11 DIAGNOSIS — O09293 Supervision of pregnancy with other poor reproductive or obstetric history, third trimester: Secondary | ICD-10-CM | POA: Diagnosis not present

## 2017-09-11 DIAGNOSIS — O99210 Obesity complicating pregnancy, unspecified trimester: Secondary | ICD-10-CM

## 2017-09-11 DIAGNOSIS — O163 Unspecified maternal hypertension, third trimester: Secondary | ICD-10-CM | POA: Diagnosis not present

## 2017-09-11 DIAGNOSIS — O09299 Supervision of pregnancy with other poor reproductive or obstetric history, unspecified trimester: Secondary | ICD-10-CM

## 2017-09-11 DIAGNOSIS — O162 Unspecified maternal hypertension, second trimester: Secondary | ICD-10-CM

## 2017-09-11 DIAGNOSIS — O24414 Gestational diabetes mellitus in pregnancy, insulin controlled: Secondary | ICD-10-CM

## 2017-09-11 DIAGNOSIS — O99213 Obesity complicating pregnancy, third trimester: Secondary | ICD-10-CM

## 2017-09-11 DIAGNOSIS — O09899 Supervision of other high risk pregnancies, unspecified trimester: Secondary | ICD-10-CM

## 2017-09-11 MED ORDER — METFORMIN HCL 500 MG PO TABS: 1000.0000 mg | ORAL_TABLET | Freq: Every day | ORAL | 2 refills | Status: DC

## 2017-09-11 NOTE — Progress Notes (Signed)
b

## 2017-09-11 NOTE — Patient Instructions (Signed)

## 2017-09-11 NOTE — Progress Notes (Addendum)
   PRENATAL VISIT NOTE  Subjective:  Anna Jordan is a 26 y.o. G3P1011 at 4290w3d being seen today for ongoing prenatal care.  She is currently monitored for the following issues for this high-risk pregnancy and has Obesity in pregnancy; History of macrosomia in infant in prior pregnancy, currently pregnant; Chronic right-sided low back pain with right-sided sciatica; Generalized anxiety disorder; History of vertebral compression fracture; Tobacco use disorder; Insulin controlled White classification A2 gestational diabetes mellitus (GDM); Hypertension affecting pregnancy in second trimester; Supervision of other high risk pregnancy, antepartum; Pelvic pain affecting pregnancy in second trimester, antepartum; and Candida vaginitis on her problem list.  Patient reports occasional contractions.  Contractions: Not present.  .  Movement: Present. Denies leaking of fluid.   The following portions of the patient's history were reviewed and updated as appropriate: allergies, current medications, past family history, past medical history, past social history, past surgical history and problem list. Problem list updated.  Objective:   Vitals:   09/11/17 1033  BP: 123/60  Weight: 287 lb (130.2 kg)    Fetal Status: Fetal Heart Rate (bpm): 145   Movement: Present     NST 145, mod variability, 15x15 accels, no decels.  Toco: Rare, mild UC's  General:  Alert, oriented and cooperative. Patient is in no acute distress.  Skin: Skin is warm and dry. No rash noted.   Cardiovascular: Normal heart rate noted  Respiratory: Normal respiratory effort, no problems with respiration noted  Abdomen: Soft, gravid, appropriate for gestational age.  Pain/Pressure: Absent     Pelvic: Cervical exam deferred        Extremities: Normal range of motion.  Edema: Trace  Mental Status:  Normal mood and affect. Normal behavior. Normal judgment and thought content.   Fasting CBGs: 110's-125 (100% out of range) PCB:  <120  (10% out of range) PCL:  <120 (10% out of range) PCD: <120 (10% out of range)   Assessment and Plan:  Pregnancy: G3P1011 at 2890w3d  1. GDM, class A2--Fasting CBGS still elevated.  - metFORMIN (GLUCOPHAGE) 500 MG tablet; Take 2 tablets (1,000 mg total) by mouth at bedtime.  Dispense: 30 tablet; Refill: 2 - US MFM OB FOLLOW UP; Future - US MFM FETAL BPP WO NON STRESS; Future  2. Insulin controlled White classification A2 gestational diabetes mellitus (GDM) - Increase Metformin to 1000 mg QHS and test CBGs at 3:00 am to see if she if dropping and then rebounding in the morning.   3. Tobacco use disorder   4. Supervision of other high risk pregnancy, antepartum   5. Hypertension affecting pregnancy in second trimester - COntinue Labetalol - Pre-E Precautions. - Growth US  6. History of macrosomia in infant in prior pregnancy, currently pregnant - Growth US  7. Obesity in pregnancy   Preterm labor symptoms and general obstetric precautions including but not limited to vaginal bleeding, contractions, leaking of fluid and fetal movement were reviewed in detail with the patient. Please refer to After Visit Summary for other counseling recommendations.  No Follow-up on file.   Dorathy KinsmanVirginia Halcyon Heck, CNM

## 2017-09-14 ENCOUNTER — Other Ambulatory Visit: Payer: Self-pay

## 2017-09-14 ENCOUNTER — Telehealth: Payer: Self-pay

## 2017-09-14 NOTE — Telephone Encounter (Signed)
Pt called in distress stating that she lost her mucus plug and she was having diarrhea and intense pain and pressure. She stated that her water had not broke but the pain and pressure was very intense. She seemed to be in a panic and upset. Considering we didn't have a doctor in the office at the time, I recommended [facility] to be [facility].

## 2017-09-15 ENCOUNTER — Ambulatory Visit (INDEPENDENT_AMBULATORY_CARE_PROVIDER_SITE_OTHER): Payer: Medicaid Other | Admitting: *Deleted

## 2017-09-15 VITALS — BP 134/60 | HR 96 | Wt 282.0 lb

## 2017-09-15 DIAGNOSIS — O24414 Gestational diabetes mellitus in pregnancy, insulin controlled: Secondary | ICD-10-CM

## 2017-09-18 ENCOUNTER — Encounter: Payer: Self-pay | Admitting: Advanced Practice Midwife

## 2017-09-18 ENCOUNTER — Encounter: Payer: Self-pay | Admitting: Obstetrics & Gynecology

## 2017-09-18 ENCOUNTER — Ambulatory Visit (INDEPENDENT_AMBULATORY_CARE_PROVIDER_SITE_OTHER): Payer: Medicaid Other | Admitting: Advanced Practice Midwife

## 2017-09-18 VITALS — BP 132/71 | HR 89 | Wt 285.0 lb

## 2017-09-18 DIAGNOSIS — O162 Unspecified maternal hypertension, second trimester: Secondary | ICD-10-CM

## 2017-09-18 DIAGNOSIS — O163 Unspecified maternal hypertension, third trimester: Secondary | ICD-10-CM

## 2017-09-18 DIAGNOSIS — O09299 Supervision of pregnancy with other poor reproductive or obstetric history, unspecified trimester: Secondary | ICD-10-CM

## 2017-09-18 DIAGNOSIS — O09893 Supervision of other high risk pregnancies, third trimester: Secondary | ICD-10-CM

## 2017-09-18 DIAGNOSIS — O09293 Supervision of pregnancy with other poor reproductive or obstetric history, third trimester: Secondary | ICD-10-CM

## 2017-09-18 DIAGNOSIS — O24414 Gestational diabetes mellitus in pregnancy, insulin controlled: Secondary | ICD-10-CM

## 2017-09-18 DIAGNOSIS — O09899 Supervision of other high risk pregnancies, unspecified trimester: Secondary | ICD-10-CM

## 2017-09-18 MED ORDER — "INSULIN SYRINGE 31G X 5/16"" 1 ML MISC": 1.0000 | Freq: Four times a day (QID) | 1 refills | Status: DC

## 2017-09-18 MED ORDER — INSULIN NPH (HUMAN) (ISOPHANE) 100 UNIT/ML ~~LOC~~ SUSP: SUBCUTANEOUS | 3 refills | Status: DC

## 2017-09-18 NOTE — Patient Instructions (Signed)
Hypertension During Pregnancy Hypertension is also called high blood pressure. High blood pressure means that the force of your blood moving in your body is too strong. When you are pregnant, this condition should be watched carefully. It can cause problems for you and your baby. Follow these instructions at home: Eating and drinking  Drink enough fluid to keep your pee (urine) clear or pale yellow.  Eat healthy foods that are low in salt (sodium). ? Do not add salt to your food. ? Check labels on foods and drinks to see much salt is in them. Look on the label where you see "Sodium." Lifestyle  Do not use any products that contain nicotine or tobacco, such as cigarettes and e-cigarettes. If you need help quitting, ask your doctor.  Do not use alcohol.  Avoid caffeine.  Avoid stress. Rest and get plenty of sleep. General instructions  Take over-the-counter and prescription medicines only as told by your doctor.  While lying down, lie on your left side. This keeps pressure off your baby.  While sitting or lying down, raise (elevate) your feet. Try putting some pillows under your lower legs.  Exercise regularly. Ask your doctor what kinds of exercise are best for you.  Keep all prenatal and follow-up visits as told by your doctor. This is important. Contact a doctor if:  You have symptoms that your doctor told you to watch for, such as: ? Fever. ? Throwing up (vomiting). ? Headache. Get help right away if:  You have very bad pain in your belly (abdomen).  You are throwing up, and this does not get better with treatment.  You suddenly get swelling in your hands, ankles, or face.  You gain 4 lb (1.8 kg) or more in 1 week.  You get bleeding from your vagina.  You have blood in your pee.  You do not feel your baby moving as much as normal.  You have a change in vision.  You have muscle twitching or sudden tightening (spasms).  You have trouble breathing.  Your lips  or fingernails turn blue. This information is not intended to replace advice given to you by your health care provider. Make sure you discuss any questions you have with your health care provider. Document Released: 12/03/2010 Document Revised: 07/12/2016 Document Reviewed: 07/12/2016 Elsevier Interactive Patient Education  2017 [employer]  

## 2017-09-18 NOTE — Progress Notes (Signed)
   PRENATAL VISIT NOTE  Subjective:  Anna Jordan is a 26 y.o. G3P1011 at 4873w3d being seen today for ongoing prenatal care.  She is currently monitored for the following issues for this high-risk pregnancy and has Obesity in pregnancy; History of macrosomia in infant in prior pregnancy, currently pregnant; Chronic right-sided low back pain with right-sided sciatica; Generalized anxiety disorder; History of vertebral compression fracture; Tobacco use disorder; Insulin controlled White classification A2 gestational diabetes mellitus (GDM); Hypertension affecting pregnancy in second trimester; Supervision of other high risk pregnancy, antepartum; Pelvic pain affecting pregnancy in second trimester, antepartum; and Candida vaginitis on their problem list.  Patient reports occasional contractions. Pt requesting waterbirth or to labor in water. Wants to not have to stay in bed in labor.  Contractions: Irritability. Vag. Bleeding: None.  Movement: Present. Denies leaking of fluid.   The following portions of the patient's history were reviewed and updated as appropriate: allergies, current medications, past family history, past medical history, past social history, past surgical history and problem list. Problem list updated.  Objective:   Vitals:   09/18/17 1037  BP: 132/71  Pulse: 89  Weight: 285 lb (129.3 kg)    Fetal Status: Fetal Heart Rate (bpm): 149 Fundal Height: 38 cm Movement: Present     General:  Alert, oriented and cooperative. Patient is in no acute distress.  Skin: Skin is warm and dry. No rash noted.   Cardiovascular: Normal heart rate noted  Respiratory: Normal respiratory effort, no problems with respiration noted  Abdomen: Soft, gravid, appropriate for gestational age.  Pain/Pressure: Present     Pelvic: Cervical exam deferred        Extremities: Normal range of motion.  Edema: Mild pitting, slight indentation  Mental Status:  Normal mood and affect. Normal behavior. Normal  judgment and thought content.   Most of Fasting CBG still 120's. >90% PP B/L/D in range.   Assessment and Plan:  Pregnancy: G3P1011 at 8973w3d  1. Supervision of other high risk pregnancy, antepartum   2. Insulin controlled White classification A2 gestational diabetes mellitus (GDM)--Uncontrolled fastingn CBGs. - Increase QHS NPH to 50 Units per consult w/ Dr. Shawnie PonsPratt - Not A candidate for waterbirth. Could possibly get in shower if she can keep baby on monitor.   3. Hypertension affecting pregnancy in second trimester - No Pre-E Sx.  - Continue Labetalol at current dose  4. History of macrosomia in infant in prior pregnancy, currently pregnant - Growth US scheduled 11/8  Preterm labor symptoms and general obstetric precautions including but not limited to vaginal bleeding, contractions, leaking of fluid and fetal movement were reviewed in detail with the patient. Please refer to After Visit Summary for other counseling recommendations.  Return in about 10 days (around 09/28/2017) for ROB/GBS.   Dorathy KinsmanVirginia Danelia Snodgrass, CNM

## 2017-09-21 ENCOUNTER — Ambulatory Visit (HOSPITAL_COMMUNITY)
Admission: RE | Admit: 2017-09-21 | Discharge: 2017-09-21 | Disposition: A | Discharge status: Home / Self Care | Payer: Medicaid Other | Source: Ambulatory Visit | Attending: Advanced Practice Midwife | Admitting: Advanced Practice Midwife

## 2017-09-21 ENCOUNTER — Other Ambulatory Visit: Payer: Self-pay

## 2017-09-21 DIAGNOSIS — O99210 Obesity complicating pregnancy, unspecified trimester: Secondary | ICD-10-CM

## 2017-09-21 DIAGNOSIS — O162 Unspecified maternal hypertension, second trimester: Secondary | ICD-10-CM | POA: Diagnosis not present

## 2017-09-21 DIAGNOSIS — O09299 Supervision of pregnancy with other poor reproductive or obstetric history, unspecified trimester: Secondary | ICD-10-CM | POA: Diagnosis not present

## 2017-09-21 DIAGNOSIS — O24414 Gestational diabetes mellitus in pregnancy, insulin controlled: Secondary | ICD-10-CM | POA: Diagnosis present

## 2017-09-21 DIAGNOSIS — O09899 Supervision of other high risk pregnancies, unspecified trimester: Secondary | ICD-10-CM | POA: Diagnosis present

## 2017-09-21 DIAGNOSIS — F17200 Nicotine dependence, unspecified, uncomplicated: Secondary | ICD-10-CM | POA: Diagnosis present

## 2017-09-21 DIAGNOSIS — Z3A35 35 weeks gestation of pregnancy: Secondary | ICD-10-CM | POA: Insufficient documentation

## 2017-09-22 ENCOUNTER — Inpatient Hospital Stay (HOSPITAL_COMMUNITY)
Admission: AD | Admit: 2017-09-22 | Discharge: 2017-09-23 | Disposition: A | Discharge status: Home / Self Care | Payer: Medicaid Other | Source: Ambulatory Visit | Attending: Obstetrics & Gynecology | Admitting: Obstetrics & Gynecology

## 2017-09-22 ENCOUNTER — Encounter (HOSPITAL_COMMUNITY): Payer: Self-pay | Admitting: *Deleted

## 2017-09-22 DIAGNOSIS — Z885 Allergy status to narcotic agent status: Secondary | ICD-10-CM | POA: Insufficient documentation

## 2017-09-22 DIAGNOSIS — Z3A36 36 weeks gestation of pregnancy: Secondary | ICD-10-CM | POA: Insufficient documentation

## 2017-09-22 DIAGNOSIS — O162 Unspecified maternal hypertension, second trimester: Secondary | ICD-10-CM

## 2017-09-22 DIAGNOSIS — R51 Headache: Secondary | ICD-10-CM

## 2017-09-22 DIAGNOSIS — O4703 False labor before 37 completed weeks of gestation, third trimester: Secondary | ICD-10-CM | POA: Insufficient documentation

## 2017-09-22 DIAGNOSIS — R109 Unspecified abdominal pain: Secondary | ICD-10-CM | POA: Diagnosis present

## 2017-09-22 DIAGNOSIS — O1413 Severe pre-eclampsia, third trimester: Secondary | ICD-10-CM | POA: Diagnosis present

## 2017-09-22 DIAGNOSIS — O26892 Other specified pregnancy related conditions, second trimester: Secondary | ICD-10-CM

## 2017-09-22 DIAGNOSIS — O10013 Pre-existing essential hypertension complicating pregnancy, third trimester: Secondary | ICD-10-CM | POA: Diagnosis not present

## 2017-09-22 DIAGNOSIS — O09899 Supervision of other high risk pregnancies, unspecified trimester: Secondary | ICD-10-CM

## 2017-09-22 DIAGNOSIS — B373 Candidiasis of vulva and vagina: Secondary | ICD-10-CM

## 2017-09-22 DIAGNOSIS — Z880 Allergy status to penicillin: Secondary | ICD-10-CM | POA: Insufficient documentation

## 2017-09-22 DIAGNOSIS — Z87891 Personal history of nicotine dependence: Secondary | ICD-10-CM | POA: Diagnosis not present

## 2017-09-22 DIAGNOSIS — R519 Headache, unspecified: Secondary | ICD-10-CM

## 2017-09-22 DIAGNOSIS — R102 Pelvic and perineal pain: Secondary | ICD-10-CM

## 2017-09-22 DIAGNOSIS — O10919 Unspecified pre-existing hypertension complicating pregnancy, unspecified trimester: Secondary | ICD-10-CM

## 2017-09-22 DIAGNOSIS — B3731 Acute candidiasis of vulva and vagina: Secondary | ICD-10-CM

## 2017-09-22 DIAGNOSIS — O479 False labor, unspecified: Secondary | ICD-10-CM

## 2017-09-22 DIAGNOSIS — O24414 Gestational diabetes mellitus in pregnancy, insulin controlled: Secondary | ICD-10-CM

## 2017-09-22 LAB — CBC
HCT: 35.4 % — ABNORMAL LOW (ref 36.0–46.0)
Hemoglobin: 12 g/dL (ref 12.0–15.0)
MCH: 29 pg (ref 26.0–34.0)
MCHC: 33.9 g/dL (ref 30.0–36.0)
MCV: 85.5 fL (ref 78.0–100.0)
PLATELETS: 228 10*3/uL (ref 150–400)
RBC: 4.14 MIL/uL (ref 3.87–5.11)
RDW: 16 % — ABNORMAL HIGH (ref 11.5–15.5)
WBC: 8.7 10*3/uL (ref 4.0–10.5)

## 2017-09-22 LAB — URINALYSIS, ROUTINE W REFLEX MICROSCOPIC
Bacteria, UA: NONE SEEN
Bilirubin Urine: NEGATIVE
Glucose, UA: 150 mg/dL — AB
Hgb urine dipstick: NEGATIVE
Ketones, ur: 5 mg/dL — AB
Leukocytes, UA: NEGATIVE
Nitrite: NEGATIVE
Protein, ur: 100 mg/dL — AB
Specific Gravity, Urine: 1.032 — ABNORMAL HIGH (ref 1.005–1.030)
pH: 5 (ref 5.0–8.0)

## 2017-09-22 LAB — COMPREHENSIVE METABOLIC PANEL WITH GFR
ALT: 20 U/L (ref 14–54)
AST: 21 U/L (ref 15–41)
Albumin: 2.8 g/dL — ABNORMAL LOW (ref 3.5–5.0)
Alkaline Phosphatase: 87 U/L (ref 38–126)
Anion gap: 11 (ref 5–15)
BUN: 10 mg/dL (ref 6–20)
CO2: 20 mmol/L — ABNORMAL LOW (ref 22–32)
Calcium: 9.1 mg/dL (ref 8.9–10.3)
Chloride: 104 mmol/L (ref 101–111)
Creatinine, Ser: 0.5 mg/dL (ref 0.44–1.00)
GFR calc Af Amer: >60 mL/min (ref >60)
GFR calc non Af Amer: >60 mL/min (ref >60)
Glucose, Bld: 130 mg/dL — ABNORMAL HIGH (ref 65–99)
Potassium: 3.8 mmol/L (ref 3.5–5.1)
Sodium: 135 mmol/L (ref 135–145)
Total Bilirubin: 0.1 mg/dL — ABNORMAL LOW (ref 0.3–1.2)
Total Protein: 6.1 g/dL — ABNORMAL LOW (ref 6.5–8.1)

## 2017-09-22 LAB — TYPE AND SCREEN
ABO/RH(D): O POS
ANTIBODY SCREEN: NEGATIVE

## 2017-09-22 LAB — PROTEIN / CREATININE RATIO, URINE
Creatinine, Urine: 149 mg/dL
Protein Creatinine Ratio: 0.12 mg/mg{creat} (ref 0.00–0.15)
Total Protein, Urine: 18 mg/dL

## 2017-09-22 MED ORDER — DEXAMETHASONE SODIUM PHOSPHATE 10 MG/ML IJ SOLN
10.0000 mg | INTRAMUSCULAR | Status: DC
  Filled 2017-09-22: qty 1

## 2017-09-22 MED ORDER — FENTANYL CITRATE (PF) 100 MCG/2ML IJ SOLN: 50.0000 ug | INTRAMUSCULAR | Status: DC | PRN

## 2017-09-22 MED ORDER — BUTALBITAL-APAP-CAFFEINE 50-325-40 MG PO TABS: 1.0000 | ORAL_TABLET | Freq: Four times a day (QID) | ORAL | 0 refills | Status: DC | PRN

## 2017-09-22 MED ORDER — HYDRALAZINE HCL 20 MG/ML IJ SOLN: 5.0000 mg | INTRAMUSCULAR | Status: DC | PRN

## 2017-09-22 MED ORDER — ONDANSETRON HCL 4 MG/2ML IJ SOLN: 4.0000 mg | Freq: Four times a day (QID) | INTRAMUSCULAR | Status: DC | PRN

## 2017-09-22 MED ORDER — LABETALOL HCL 5 MG/ML IV SOLN
20.0000 mg | INTRAVENOUS | Status: DC | PRN
  Administered 2017-09-22: 20 mg via INTRAVENOUS
  Filled 2017-09-22: qty 4

## 2017-09-22 MED ORDER — METOCLOPRAMIDE HCL 5 MG/ML IJ SOLN
10.0000 mg | INTRAMUSCULAR | Status: AC
  Administered 2017-09-22: 10 mg via INTRAVENOUS
  Filled 2017-09-22: qty 2

## 2017-09-22 MED ORDER — LABETALOL HCL 5 MG/ML IV SOLN: 20.0000 mg | INTRAVENOUS | Status: DC | PRN

## 2017-09-22 MED ORDER — LABETALOL HCL 200 MG PO TABS: 400.0000 mg | ORAL_TABLET | Freq: Three times a day (TID) | ORAL | 2 refills | Status: DC

## 2017-09-22 MED ORDER — OXYTOCIN BOLUS FROM INFUSION: 500.0000 mL | Freq: Once | INTRAVENOUS | Status: DC

## 2017-09-22 MED ORDER — ACETAMINOPHEN 325 MG PO TABS: 650.0000 mg | ORAL_TABLET | ORAL | Status: DC | PRN

## 2017-09-22 MED ORDER — LACTATED RINGERS IV SOLN: INTRAVENOUS | Status: DC

## 2017-09-22 MED ORDER — LACTATED RINGERS IV SOLN: 500.0000 mL | INTRAVENOUS | Status: DC | PRN

## 2017-09-22 MED ORDER — OXYTOCIN 40 UNITS IN LACTATED RINGERS INFUSION - SIMPLE MED: 2.5000 [IU]/h | INTRAVENOUS | Status: DC

## 2017-09-22 MED ORDER — HYDRALAZINE HCL 20 MG/ML IJ SOLN: 10.0000 mg | Freq: Once | INTRAMUSCULAR | Status: DC | PRN

## 2017-09-22 MED ORDER — DIPHENHYDRAMINE HCL 50 MG/ML IJ SOLN
25.0000 mg | INTRAMUSCULAR | Status: AC
  Administered 2017-09-22: 25 mg via INTRAVENOUS
  Filled 2017-09-22: qty 1

## 2017-09-22 MED ORDER — SOD CITRATE-CITRIC ACID 500-334 MG/5ML PO SOLN: 30.0000 mL | ORAL | Status: DC | PRN

## 2017-09-22 MED ORDER — MAGNESIUM SULFATE BOLUS VIA INFUSION
4.0000 g | Freq: Once | INTRAVENOUS | Status: DC
  Filled 2017-09-22: qty 500

## 2017-09-22 MED ORDER — LIDOCAINE HCL (PF) 1 % IJ SOLN: 30.0000 mL | INTRAMUSCULAR | Status: DC | PRN

## 2017-09-22 MED ORDER — SODIUM CHLORIDE 0.9 % IV BOLUS (SEPSIS)
1000.0000 mL | Freq: Once | INTRAVENOUS | Status: AC
  Administered 2017-09-22: 1000 mL via INTRAVENOUS

## 2017-09-22 MED ORDER — MAGNESIUM SULFATE 40 G IN LACTATED RINGERS - SIMPLE
2.0000 g/h | INTRAVENOUS | Status: DC
  Filled 2017-09-22: qty 500

## 2017-09-22 NOTE — MAU Note (Signed)
Pt reports a really bad headache, swelling, nausea, abd pain, back pain, blurred vision. Symptoms started this am.

## 2017-09-22 NOTE — MAU Provider Note (Signed)
History     CSN: [id number]  Arrival date and time: 09/22/17 1717   First Provider Initiated Contact with Patient 09/22/17 1945      Chief Complaint  Patient presents with  . Headache  . Nausea  . Edema  . Abdominal Pain  . Back Pain   HPI  Ms. Anna Jordan is a 26 y.o. G3P1011 at 40w0dgestation presenting to MAU with complaints of "bad" H/A, swelling, nausea, abdominal pain, and back pain. She reports the sx's started today. She denies UC's, VB or LOF.  Past Medical History:  Diagnosis Date  . Candida vaginitis 06/18/2017  . Gestational diabetes   . Hypertension   . Obesity   . Schizophrenia ([facility]     Past Surgical History:  Procedure Laterality Date  . WISDOM TOOTH EXTRACTION      Family History  Problem Relation Age of Onset  . Depression Mother   . Diabetes Mother   . Heart disease Mother   . Stroke Mother   . Heart attack Father   . Breast cancer Maternal Aunt        4 mat aunts with breast cancer    Social History   Tobacco Use  . Smoking status: Former Smoker    Packs/day: 0.25    Types: Cigarettes    Last attempt to quit: 03/31/2017    Years since quitting: 0.4  . Smokeless tobacco: Never Used  . Tobacco comment: reduce # the cigarettes   Substance Use Topics  . Alcohol use: No  . Drug use: No    Allergies:  Allergies  Allergen Reactions  . Hydrocodone Hives  . Morphine Hives  . Tramadol Hives  . Penicillin G Rash    Has patient had a PCN reaction causing immediate rash, facial/tongue/throat swelling, SOB or lightheadedness with hypotension: unknown Has patient had a PCN reaction causing severe rash involving mucus membranes or skin necrosis: unknown Has patient had a PCN reaction that required hospitalization: unknown Has patient had a PCN reaction occurring within the last 10 years: no If all of the above answers are "NO", then may proceed with Cephalosporin use.     Facility-Administered Medications Prior to Admission   Medication Dose Route Frequency Provider Last Rate Last Dose  . insulin starter kit- syringes (English) 1 kit  1 kit Other Once DPorter MWilhemina Cash MD       Medications Prior to Admission  Medication Sig Dispense Refill Last Dose  . ACCU-CHEK FASTCLIX LANCETS MISC U TO CHECK BLOOD SUGAR QID 150 each 12 Taking  . ACCU-CHEK SMARTVIEW test strip 1 each by Other route 4 (four) times daily. Use as instructed 50 each 6 Taking  . acetaminophen (TYLENOL) 325 MG tablet Take 650 mg by mouth every 6 (six) hours as needed for moderate pain.   Taking  . Doxylamine-Pyridoxine (DICLEGIS) 10-10 MG TBEC Take 2 tablets by mouth at bedtime. 60 tablet 2 Taking  . esomeprazole (NEXIUM) 20 MG packet Take 20 mg by mouth daily before breakfast. 30 each 12 Taking  . insulin aspart (NOVOLOG) 100 UNIT/ML injection 24 units with breakfast  30 units with lunch  30 units with supper 40 mL 12 Taking  . insulin NPH Human (HUMULIN N,NOVOLIN N) 100 UNIT/ML injection Inject 36 units subcutaneous in AM and 50 units at bedtime 40 mL 3   . Insulin Syringe-Needle U-100 (INSULIN SYRINGE .3CC/31GX5/16") 31G X 5/16" 0.3 ML MISC Use as directed 100 each 10 Taking  . Insulin Syringe-Needle U-100 (INSULIN SYRINGE  1CC/31GX5/16") 31G X 5/16" 1 ML MISC 1 Device 4 (four) times daily by Does not apply route. 100 each 1   . labetalol (NORMODYNE) 200 MG tablet Take 2 tablets (400 mg total) by mouth 2 (two) times daily. 60 tablet 3 Taking  . metFORMIN (GLUCOPHAGE) 500 MG tablet Take 2 tablets (1,000 mg total) by mouth at bedtime. 30 tablet 2 Taking  . Prenatal Vit-Fe Fumarate-FA (PREPLUS) 27-1 MG TABS TK 1 T PO D  12 Taking  . PROAIR HFA 108 (90 Base) MCG/ACT inhaler INHALE 2 PUFFS PO INTO THE LUNGS Q 4 H PRF WHEEZING  0 Taking  . ranitidine (ZANTAC) 150 MG tablet Take 1 tablet (150 mg total) by mouth 2 (two) times daily. 60 tablet 4 Taking    Review of Systems  Constitutional: Negative.   HENT: Negative.   Eyes: Negative.   Respiratory:  Negative.   Cardiovascular: Negative.   Gastrointestinal: Positive for abdominal pain and nausea.  Endocrine: Negative.   Genitourinary: Negative.   Musculoskeletal: Positive for back pain.  Allergic/Immunologic: Negative.   Neurological: Positive for headaches ("really bad").  Hematological: Negative.   Psychiatric/Behavioral: Negative.    Physical Exam   Blood pressure (!) 157/61, pulse 95, temperature 98.3 F (36.8 C), resp. rate 18, height '5\' 3"'  (1.6 m), weight 285 lb (129.3 kg), last menstrual period 12/25/2016, SpO2 99 %.  Patient Vitals for the past 24 hrs:  BP Temp Pulse Resp SpO2 Height Weight  09/22/17 2131 (!) 157/61 - 95 - - - -  09/22/17 2117 (!) 147/71 - 99 - - - -  09/22/17 2101 (!) 158/89 - 93 - - - -  09/22/17 2046 (!) 143/103 - 87 - - - -  09/22/17 2031 (!) 160/110 - 86 - - - -  09/22/17 2016 136/67 - 77 - - - -  09/22/17 2001 139/85 - 86 - - - -  09/22/17 1946 (!) 146/76 - 88 - - - -  09/22/17 1931 (!) 161/58 - 87 - - - -  09/22/17 1922 (!) 147/72 - 96 - - - -  09/22/17 1900 (!) 145/85 - (!) 103 - - - -  09/22/17 1831 137/64 - 88 - - - -  09/22/17 1818 122/61 - 92 - - - -  09/22/17 1816 107/79 - (!) 109 - - - -  09/22/17 1749 127/83 98.3 F (36.8 C) 89 18 99 % '5\' 3"'  (1.6 m) 285 lb (129.3 kg)    Physical Exam  Nursing note and vitals reviewed. Constitutional: She is oriented to person, place, and time. She appears well-developed and well-nourished.  HENT:  Head: Normocephalic.  Eyes: Pupils are equal, round, and reactive to light.  Neck: Normal range of motion.  Cardiovascular: Normal rate, regular rhythm, normal heart sounds and intact distal pulses.  2+ pitting edema in BLE  Respiratory: Effort normal and breath sounds normal.  GI: Soft. Bowel sounds are normal.  Musculoskeletal: Normal range of motion.  Neurological: She is alert and oriented to person, place, and time.  Skin: Skin is warm and dry.  Psychiatric: She has a normal mood and affect.  Her behavior is normal. Judgment and thought content normal.    MAU Course  Procedures  MDM CCUA CBC CMP P/C Ratio NST - FHR: 135 bpm / moderate variability / accels present / decels absent / TOCO: irregular UC's    Results for orders placed or performed during the hospital encounter of 09/22/17 (from the past 24 hour(s))  Urinalysis,  Routine w reflex microscopic     Status: Abnormal   Collection Time: 09/22/17  5:46 PM  Result Value Ref Range   Color, Urine YELLOW YELLOW   APPearance HAZY (A) CLEAR   Specific Gravity, Urine 1.032 (H) 1.005 - 1.030   pH 5.0 5.0 - 8.0   Glucose, UA 150 (A) NEGATIVE mg/dL   Hgb urine dipstick NEGATIVE NEGATIVE   Bilirubin Urine NEGATIVE NEGATIVE   Ketones, ur 5 (A) NEGATIVE mg/dL   Protein, ur 100 (A) NEGATIVE mg/dL   Nitrite NEGATIVE NEGATIVE   Leukocytes, UA NEGATIVE NEGATIVE   RBC / HPF 0-5 0 - 5 RBC/hpf   WBC, UA 0-5 0 - 5 WBC/hpf   Bacteria, UA NONE SEEN NONE SEEN   Squamous Epithelial / LPF 6-30 (A) NONE SEEN   Mucus PRESENT   Protein / creatinine ratio, urine     Status: None   Collection Time: 09/22/17  5:46 PM  Result Value Ref Range   Creatinine, Urine 149.00 mg/dL   Total Protein, Urine 18 mg/dL   Protein Creatinine Ratio 0.12 0.00 - 0.15 mg/mg[Cre]  CBC     Status: Abnormal   Collection Time: 09/22/17  8:31 PM  Result Value Ref Range   WBC 8.7 4.0 - 10.5 K/uL   RBC 4.14 3.87 - 5.11 MIL/uL   Hemoglobin 12.0 12.0 - 15.0 g/dL   HCT 35.4 (L) 36.0 - 46.0 %   MCV 85.5 78.0 - 100.0 fL   MCH 29.0 26.0 - 34.0 pg   MCHC 33.9 30.0 - 36.0 g/dL   RDW 16.0 (H) 11.5 - 15.5 %   Platelets 228 150 - 400 K/uL  Comprehensive metabolic panel     Status: Abnormal   Collection Time: 09/22/17  8:31 PM  Result Value Ref Range   Sodium 135 135 - 145 mmol/L   Potassium 3.8 3.5 - 5.1 mmol/L   Chloride 104 101 - 111 mmol/L   CO2 20 (L) 22 - 32 mmol/L   Glucose, Bld 130 (H) 65 - 99 mg/dL   BUN 10 6 - 20 mg/dL   Creatinine, Ser 0.50 0.44 - 1.00  mg/dL   Calcium 9.1 8.9 - 10.3 mg/dL   Total Protein 6.1 (L) 6.5 - 8.1 g/dL   Albumin 2.8 (L) 3.5 - 5.0 g/dL   AST 21 15 - 41 U/L   ALT 20 14 - 54 U/L   Alkaline Phosphatase 87 38 - 126 U/L   Total Bilirubin 0.1 (L) 0.3 - 1.2 mg/dL   GFR calc non Af Amer >60 >60 mL/min   GFR calc Af Amer >60 >60 mL/min   Anion gap 11 5 - 15    Report given to and care assumed by Kerry Hough, PA-C @ 2150  Laury Deep, MSN, CNM 09/22/2017, 7:45 PM   BP improved with pain medication for HA and IV labetalol 103m No longer endorsing pain  Discussed care with Dr. EElonda Husky Assessment and Plan  cHTN Intractable HA Braxton-Hicks contractions  Worsening of BP either due to uncontrolled cHTN vs pain as factor. PMankatolabs negative do not believe she has SIPE at this time.   Discharge home in stable condition Increase labetalol to 4036mTID Fiorcet Rx given Follow-up OB provider on Monday for recheck Return precautions discussed Handout given   JaLuiz BlareDO OB Fellow

## 2017-09-22 NOTE — Discharge Instructions (Signed)
Preeclampsia and Eclampsia °Preeclampsia is a serious condition that develops only during pregnancy. It is also called toxemia of pregnancy. This condition causes high blood pressure along with other symptoms, such as swelling and headaches. These symptoms may develop as the condition gets worse. Preeclampsia may occur at 20 weeks of pregnancy or later. °Diagnosing and treating preeclampsia early is very important. If not treated early, it can cause serious problems for you and your baby. One problem it can lead to is eclampsia, which is a condition that causes muscle jerking or shaking (convulsions or seizures) in the mother. Delivering your baby is the best treatment for preeclampsia or eclampsia. Preeclampsia and eclampsia symptoms usually go away after your baby is born. °What are the causes? °The cause of preeclampsia is not known. °What increases the risk? °The following risk factors make you more likely to develop preeclampsia: °· Being pregnant for the first time. °· Having had preeclampsia during a past pregnancy. °· Having a family history of preeclampsia. °· Having high blood pressure. °· Being pregnant with twins or triplets. °· Being 35 or older. °· Being African-American. °· Having kidney disease or diabetes. °· Having medical conditions such as lupus or blood diseases. °· Being very overweight (obese). ° °What are the signs or symptoms? °The earliest signs of preeclampsia are: °· High blood pressure. °· Increased protein in your urine. Your health care provider will check for this at every visit before you give birth (prenatal visit). ° °Other symptoms that may develop as the condition gets worse include: °· Severe headaches. °· Sudden weight gain. °· Swelling of the hands, face, legs, and feet. °· Nausea and vomiting. °· Vision problems, such as blurred or double vision. °· Numbness in the face, arms, legs, and feet. °· Urinating less than usual. °· Dizziness. °· Slurred speech. °· Abdominal pain,  especially upper abdominal pain. °· Convulsions or seizures. ° °Symptoms generally go away after giving birth. °How is this diagnosed? °There are no screening tests for preeclampsia. Your health care provider will ask you about symptoms and check for signs of preeclampsia during your prenatal visits. You may also have tests that include: °· Urine tests. °· Blood tests. °· Checking your blood pressure. °· Monitoring your baby’s heart rate. °· Ultrasound. ° °How is this treated? °You and your health care provider will determine the treatment approach that is best for you. Treatment may include: °· Having more frequent prenatal exams to check for signs of preeclampsia, if you have an increased risk for preeclampsia. °· Bed rest. °· Reducing how much salt (sodium) you eat. °· Medicine to lower your blood pressure. °· Staying in the hospital, if your condition is severe. There, treatment will focus on controlling your blood pressure and the amount of fluids in your body (fluid retention). °· You may need to take medicine (magnesium sulfate) to prevent seizures. This medicine may be given as an injection or through an IV tube. °· Delivering your baby early, if your condition gets worse. You may have your labor started with medicine (induced), or you may have a cesarean delivery. ° °Follow these instructions at home: °Eating and drinking ° °· Drink enough fluid to keep your urine clear or pale yellow. °· Eat a healthy diet that is low in sodium. Do not add salt to your food. Check nutrition labels to see how much sodium a food or beverage contains. °· Avoid caffeine. °Lifestyle °· Do not use any products that contain nicotine or tobacco, such as cigarettes   and e-cigarettes. If you need help quitting, ask your health care provider. °· Do not use alcohol or drugs. °· Avoid stress as much as possible. Rest and get plenty of sleep. °General instructions °· Take over-the-counter and prescription medicines only as told by your  health care provider. °· When lying down, lie on your side. This keeps pressure off of your baby. °· When sitting or lying down, raise (elevate) your feet. Try putting some pillows underneath your lower legs. °· Exercise regularly. Ask your health care provider what kinds of exercise are best for you. °· Keep all follow-up and prenatal visits as told by your health care provider. This is important. °How is this prevented? °To prevent preeclampsia or eclampsia from developing during another pregnancy: °· Get proper medical care during pregnancy. Your health care provider may be able to prevent preeclampsia or diagnose and treat it early. °· Your health care provider may have you take a low-dose aspirin or a calcium supplement during your next pregnancy. °· You may have tests of your blood pressure and kidney function after giving birth. °· Maintain a healthy weight. Ask your health care provider for help managing weight gain during pregnancy. °· Work with your health care provider to manage any long-term (chronic) health conditions you have, such as diabetes or kidney problems. ° °Contact a health care provider if: °· You gain more weight than expected. °· You have headaches. °· You have nausea or vomiting. °· You have abdominal pain. °· You feel dizzy or light-headed. °Get help right away if: °· You develop sudden or severe swelling anywhere in your body. This usually happens in the legs. °· You gain 5 lbs (2.3 kg) or more during one week. °· You have severe: °? Abdominal pain. °? Headaches. °? Dizziness. °? Vision problems. °? Confusion. °? Nausea or vomiting. °· You have a seizure. °· You have trouble moving any part of your body. °· You develop numbness in any part of your body. °· You have trouble speaking. °· You have any abnormal bleeding. °· You pass out. °This information is not intended to replace advice given to you by your health care provider. Make sure you discuss any questions you have with your health  care provider. °Document Released: 10/28/2000 Document Revised: 06/28/2016 Document Reviewed: 06/06/2016 °Elsevier Interactive Patient Education © 2018 [employer] ° °

## 2017-09-23 LAB — RPR: RPR: NONREACTIVE

## 2017-09-23 LAB — ABO/RH: ABO/RH(D): O POS

## 2017-09-23 MED ORDER — BUTALBITAL-APAP-CAFFEINE 50-325-40 MG PO TABS: 1.0000 | ORAL_TABLET | Freq: Four times a day (QID) | ORAL | 0 refills | Status: DC | PRN

## 2017-09-25 ENCOUNTER — Encounter: Payer: Self-pay | Admitting: Advanced Practice Midwife

## 2017-09-25 ENCOUNTER — Other Ambulatory Visit (HOSPITAL_COMMUNITY)
Admission: RE | Admit: 2017-09-25 | Discharge: 2017-09-25 | Disposition: A | Discharge status: Home / Self Care | Payer: Medicaid Other | Source: Ambulatory Visit | Attending: Advanced Practice Midwife | Admitting: Advanced Practice Midwife

## 2017-09-25 ENCOUNTER — Ambulatory Visit (INDEPENDENT_AMBULATORY_CARE_PROVIDER_SITE_OTHER): Payer: Medicaid Other | Admitting: Advanced Practice Midwife

## 2017-09-25 VITALS — BP 134/74 | HR 99 | Wt 282.0 lb

## 2017-09-25 DIAGNOSIS — O09293 Supervision of pregnancy with other poor reproductive or obstetric history, third trimester: Secondary | ICD-10-CM

## 2017-09-25 DIAGNOSIS — O09299 Supervision of pregnancy with other poor reproductive or obstetric history, unspecified trimester: Secondary | ICD-10-CM

## 2017-09-25 DIAGNOSIS — O0993 Supervision of high risk pregnancy, unspecified, third trimester: Secondary | ICD-10-CM

## 2017-09-25 DIAGNOSIS — O3663X0 Maternal care for excessive fetal growth, third trimester, not applicable or unspecified: Secondary | ICD-10-CM

## 2017-09-25 DIAGNOSIS — O3663X1 Maternal care for excessive fetal growth, third trimester, fetus 1: Secondary | ICD-10-CM

## 2017-09-25 NOTE — Progress Notes (Signed)
   PRENATAL VISIT NOTE  Subjective:  Kathryne HitchBrandi Bewick is a 26 y.o. G3P1011 at [redacted]w[redacted]d being seen today for ongoing prenatal care.  She is currently monitored for the following issues for this high-risk pregnancy and has Obesity in pregnancy; History of macrosomia in infant in prior pregnancy, currently pregnant; Chronic right-sided low back pain with right-sided sciatica; Generalized anxiety disorder; History of vertebral compression fracture; Tobacco use disorder; Insulin controlled White classification A2 gestational diabetes mellitus (GDM); Hypertension affecting pregnancy in second trimester; Supervision of other high risk pregnancy, antepartum; Pelvic pain affecting pregnancy in second trimester, antepartum; Candida vaginitis; Preeclampsia, severe, third trimester; and Fetal macrosomia, third trimester, fetus 1 on their problem list.  Patient reports occasional contractions and  .  Contractions: Irritability. Vag. Bleeding: None.  Movement: Present. Denies leaking of fluid.   The following portions of the patient's history were reviewed and updated as appropriate: allergies, current medications, past family history, past medical history, past social history, past surgical history and problem list. Problem list updated.  Objective:   Vitals:   09/25/17 1051  BP: 134/74  Pulse: 99  Weight: 282 lb (127.9 kg)    Fetal Status: Fetal Heart Rate (bpm): 147   Movement: Present     General:  Alert, oriented and cooperative. Patient is in no acute distress.  Skin: Skin is warm and dry. No rash noted.   Cardiovascular: Normal heart rate noted  Respiratory: Normal respiratory effort, no problems with respiration noted  Abdomen: Soft, gravid, appropriate for gestational age.  Pain/Pressure: Present     Pelvic: Cervical exam performed    /0/0/-3, V  Extremities: Normal range of motion.  Edema: Mild pitting, slight indentation  Mental Status:  Normal mood and affect. Normal behavior. Normal judgment  and thought content.   Fasting CBGs: 80-90's (<50% out of range) PCB:  <120 (0% out of range) PCL:  110-130 (<50% out of range) PCD: <120 (0% out of range)   Assessment and Plan:  Pregnancy: G3P1011 at [redacted]w[redacted]d  1. Supervision of high risk pregnancy, antepartum, third trimester  - Culture, Grp B Strep w/Rflx Suscept - Urine cytology ancillary only  2. Fetal macrosomia, third trimester, fetus 1 - EFW 7-9 >90%, AC >97%.  - At risk for SD.  3. A2GDM--better control - No change in meds for now. - Plan 39 week delivery for now  4. CHTN - Nml BP and no Pre-E Sx today.  - Plan 39 week delivery unless S/S Pre-E develop.  Term labor symptoms and general obstetric precautions including but not limited to vaginal bleeding, contractions, leaking of fluid and fetal movement were reviewed in detail with the patient. Please refer to After Visit Summary for other counseling recommendations.  Return for ROB. Continue antenatal testing.   Dorathy KinsmanVirginia Isadore Palecek, CNM

## 2017-09-25 NOTE — Progress Notes (Signed)
group

## 2017-09-26 ENCOUNTER — Encounter: Payer: Self-pay | Admitting: Advanced Practice Midwife

## 2017-09-26 LAB — URINE CYTOLOGY ANCILLARY ONLY
CHLAMYDIA, DNA PROBE: NEGATIVE
Neisseria Gonorrhea: NEGATIVE

## 2017-09-28 ENCOUNTER — Encounter: Payer: Self-pay | Admitting: *Deleted

## 2017-09-28 ENCOUNTER — Other Ambulatory Visit: Payer: Self-pay

## 2017-09-28 NOTE — Progress Notes (Signed)
Pt has been removed from Diabetes in BRX  Today.  She has delivered.

## 2017-09-30 LAB — CULTURE, STREPTOCOCCUS GRP B W/SUSCEPT
MICRO NUMBER:: 81272524
SPECIMEN QUALITY:: ADEQUATE

## 2017-10-02 ENCOUNTER — Encounter: Payer: Self-pay | Admitting: Advanced Practice Midwife

## 2017-10-02 DIAGNOSIS — O99820 Streptococcus B carrier state complicating pregnancy: Secondary | ICD-10-CM | POA: Insufficient documentation

## 2017-10-08 ENCOUNTER — Encounter: Payer: Self-pay | Admitting: Obstetrics & Gynecology

## 2017-10-12 DIAGNOSIS — J4521 Mild intermittent asthma with (acute) exacerbation: Secondary | ICD-10-CM | POA: Insufficient documentation

## 2017-11-10 ENCOUNTER — Encounter: Payer: Self-pay | Admitting: Obstetrics & Gynecology

## 2017-11-13 ENCOUNTER — Telehealth: Payer: Self-pay

## 2017-11-13 ENCOUNTER — Encounter: Payer: Self-pay | Admitting: Obstetrics & Gynecology

## 2017-11-13 ENCOUNTER — Encounter: Payer: Self-pay | Admitting: Advanced Practice Midwife

## 2017-11-13 ENCOUNTER — Ambulatory Visit (INDEPENDENT_AMBULATORY_CARE_PROVIDER_SITE_OTHER): Payer: Medicaid Other | Admitting: Advanced Practice Midwife

## 2017-11-13 DIAGNOSIS — O1495 Unspecified pre-eclampsia, complicating the puerperium: Secondary | ICD-10-CM

## 2017-11-13 DIAGNOSIS — F411 Generalized anxiety disorder: Secondary | ICD-10-CM

## 2017-11-13 DIAGNOSIS — O24414 Gestational diabetes mellitus in pregnancy, insulin controlled: Secondary | ICD-10-CM

## 2017-11-13 DIAGNOSIS — J4520 Mild intermittent asthma, uncomplicated: Secondary | ICD-10-CM

## 2017-11-13 NOTE — Progress Notes (Addendum)
Post Partum Exam  Anna Jordan is a 26 y.o. 493P1011 female who presents for a postpartum visit. She is 6 weeks postpartum following a spontaneous vaginal delivery. I have fully reviewed the prenatal and intrapartum course. The delivery was at 6731w5d gestational weeks.  Anesthesia: epidural. Postpartum course has been complicated by readmission to [facility] for postpartum preeclampsia with severe features and asthma exacerbation. She was discharge per record on Procardia XL but pt denies any discharge BP medications and has no taken any medication for her BP since her hospital stay. Baby's course has been unremarkable. Baby is feeding by breast. Bleeding no bleeding. Bowel function is normal. Bladder function is normal. Patient is not sexually active. Contraception method is none. Postpartum depression screening:neg  The following portions of the patient's history were reviewed and updated as appropriate: allergies, current medications, past family history, past medical history, past social history, past surgical history and problem list.  Review of Systems Pertinent items noted in HPI and remainder of comprehensive ROS otherwise negative.    Objective:  Blood pressure 100/70, pulse 88, resp. rate 16, height 5\' 5"  (1.651 m), weight 270 lb (122.5 kg), last menstrual period 12/25/2016.  VS reviewed, nursing note reviewed,  Constitutional: well developed, well nourished, no distress HEENT: normocephalic CV: normal rate Pulm/chest wall: normal effort Abdomen: soft Neuro: alert and oriented x 3 Skin: warm, dry Psych: affect normal   Assessment:   1. Postpartum care following vaginal delivery --Pt doing well today, well adjusted to new baby, getting sleep, infant feeding well by breast only. --Desires condoms for contraception, denies being sexually active currently.  Reviewed safety of hormonal contraception during breastfeeding, pt to contact office if she desires any  prescriptive birth control.   2. Preeclampsia in postpartum period --Pt reports she has not taken any meds for BP since her hospital admission 11/20.  BP 100/70 today.   --Encouraged pt to see PCP for regular check ups, take BP occasionally at home, or at pharmacy locations  3. Mild intermittent asthma without complication --Exacerbation causing admission to [facility] last month, pt doing better now without any complications.  4.  Generalized anxiety disorder --Reports some postpartum anxiety when away from the baby --Reports she is managing anxiety well, takes Benadryl occasional to help her relax. --Encouraged pt to let us know if anxiety is worsening, or not improving in next few weeks.  Discussed safety of anxiety/depression meds during breastfeeding.  Recommend counseling PRN to manage anxiety.  5.  Insulin controlled A2 GDM --2 hour GTT not performed today, will need to reschedule --RN called pt to schedule GTT but pt declined, reporting she had glucose testing after she had the baby and her testing was normal.   --Review of chart indicates elevated glucose on pt readmission at [facility] but no other values, and no 2 hour PP GTT results are available.  Plan:   1. Contraception: condoms 2. Follow up for GTT and then in 1 year PRN

## 2017-11-13 NOTE — Telephone Encounter (Signed)
Left voicemail on pt's phone letting her know we needed to schedule PP 2 hour GTT and she would need to be fasting. Provided the office's phone number so she could call to schedule appt.

## 2017-11-17 ENCOUNTER — Encounter: Payer: Self-pay | Admitting: Obstetrics & Gynecology

## 2017-11-24 ENCOUNTER — Encounter: Payer: Self-pay | Admitting: Obstetrics & Gynecology

## 2017-11-29 ENCOUNTER — Encounter: Payer: Self-pay | Admitting: Advanced Practice Midwife

## 2017-12-01 ENCOUNTER — Other Ambulatory Visit: Payer: Self-pay | Admitting: Advanced Practice Midwife

## 2017-12-01 MED ORDER — BUSPIRONE HCL 5 MG PO TABS: 5.0000 mg | ORAL_TABLET | Freq: Three times a day (TID) | ORAL | 2 refills | Status: DC | PRN

## 2017-12-03 ENCOUNTER — Other Ambulatory Visit: Payer: Self-pay | Admitting: Advanced Practice Midwife

## 2017-12-03 MED ORDER — LIDOCAINE HCL 2 % EX GEL: 1.0000 "application " | CUTANEOUS | 0 refills | Status: DC | PRN

## 2017-12-06 ENCOUNTER — Encounter: Payer: Self-pay | Admitting: Obstetrics & Gynecology

## 2018-01-05 ENCOUNTER — Encounter: Payer: Self-pay | Admitting: Obstetrics & Gynecology

## 2018-01-08 ENCOUNTER — Telehealth: Payer: Self-pay | Admitting: *Deleted

## 2018-01-08 MED ORDER — NORETHINDRONE 0.35 MG PO TABS: 1.0000 | ORAL_TABLET | Freq: Every day | ORAL | 10 refills | Status: DC

## 2018-01-08 NOTE — Telephone Encounter (Signed)
Pt sent a message through My-Chart requesting a RX for Micronor since she is breast feeding.  Per Collene GobbleLisa Leftwich, CNM last note she states that if pt decides to go on OCP's to just call the office.  Will counsel pt on the importance of taking the pill around the same time each day.

## 2018-07-18 ENCOUNTER — Encounter: Payer: Self-pay | Admitting: Obstetrics & Gynecology

## 2018-07-18 ENCOUNTER — Ambulatory Visit: Payer: Medicaid Other | Admitting: Obstetrics & Gynecology

## 2018-07-18 ENCOUNTER — Encounter (HOSPITAL_COMMUNITY): Payer: Self-pay

## 2018-07-18 DIAGNOSIS — M5459 Other low back pain: Secondary | ICD-10-CM | POA: Insufficient documentation

## 2018-07-18 DIAGNOSIS — Z3009 Encounter for other general counseling and advice on contraception: Secondary | ICD-10-CM | POA: Diagnosis not present

## 2018-07-18 DIAGNOSIS — Z6841 Body Mass Index (BMI) 40.0 and over, adult: Secondary | ICD-10-CM

## 2018-07-18 DIAGNOSIS — M461 Sacroiliitis, not elsewhere classified: Secondary | ICD-10-CM | POA: Insufficient documentation

## 2018-07-18 DIAGNOSIS — M47816 Spondylosis without myelopathy or radiculopathy, lumbar region: Secondary | ICD-10-CM | POA: Insufficient documentation

## 2018-07-18 DIAGNOSIS — M545 Low back pain: Secondary | ICD-10-CM | POA: Insufficient documentation

## 2018-07-18 DIAGNOSIS — E66813 Obesity, class 3: Secondary | ICD-10-CM

## 2018-07-18 HISTORY — DX: Body Mass Index (BMI) 40.0 and over, adult: Z684

## 2018-07-18 HISTORY — DX: Morbid (severe) obesity due to excess calories: E66.01

## 2018-07-18 HISTORY — DX: Obesity, class 3: E66.813

## 2018-07-18 NOTE — Progress Notes (Signed)
   Subjective:    Patient ID: Anna Jordan, female    DOB: 10-28-1991, 27 y.o.   MRN: [id number]  HPI  27 yo P2 here to discuss her desire for permanent sterility. Her husband declines a BTL. Of note, her last pregnancy was complicated by severe pre eclampsia and insulin dependent GDM. She did not get a post partum glucola and she has not been checking her sugars.  Review of Systems Pap smear normal 5/18    Objective:   Physical Exam Breathing, conversing, and ambulating normally Morbidly obese, well hydrated White female, no apparent distress Abd- benign    Assessment & Plan:  Unwanted fertility- she will sign Medicaid forms today and I will send [place] a message to schedule a laparoscopic application of Filsche clips. She understands the failure rate. She will get a 2 hour GTT next week in the lab Refer to fam med for general health maintenance/weight management

## 2018-07-24 ENCOUNTER — Other Ambulatory Visit: Payer: Medicaid Other

## 2018-07-24 DIAGNOSIS — Z8632 Personal history of gestational diabetes: Secondary | ICD-10-CM

## 2018-07-25 ENCOUNTER — Ambulatory Visit: Payer: Self-pay | Admitting: Obstetrics & Gynecology

## 2018-07-25 LAB — 2HR GTT W 1 HR, CARPENTER, 75 G
GLUCOSE, 2 HR, GEST: 180 mg/dL — AB (ref 65–152)
GLUCOSE, FASTING, GEST: 115 mg/dL — AB (ref 65–91)
Glucose, 1 Hr, Gest: 229 mg/dL — ABNORMAL HIGH (ref 65–179)

## 2018-08-14 HISTORY — PX: GALLBLADDER SURGERY: SHX652

## 2018-08-22 ENCOUNTER — Encounter (HOSPITAL_COMMUNITY): Payer: Self-pay

## 2018-08-24 ENCOUNTER — Encounter (HOSPITAL_COMMUNITY)
Admission: RE | Admit: 2018-08-24 | Discharge: 2018-08-24 | Disposition: A | Discharge status: Home / Self Care | Payer: Medicaid Other | Source: Ambulatory Visit | Attending: Obstetrics & Gynecology | Admitting: Obstetrics & Gynecology

## 2018-08-30 ENCOUNTER — Ambulatory Visit (HOSPITAL_COMMUNITY)
Admission: AD | Admit: 2018-08-30 | Payer: Medicaid Other | Source: Ambulatory Visit | Admitting: Obstetrics & Gynecology

## 2018-08-30 SURGERY — LIGATION, FALLOPIAN TUBE, LAPAROSCOPIC
Anesthesia: Choice | Laterality: Bilateral

## 2018-09-19 ENCOUNTER — Other Ambulatory Visit: Payer: Self-pay

## 2018-09-19 ENCOUNTER — Ambulatory Visit (INDEPENDENT_AMBULATORY_CARE_PROVIDER_SITE_OTHER): Payer: Medicaid Other | Admitting: Psychiatry

## 2018-09-19 ENCOUNTER — Encounter (HOSPITAL_COMMUNITY): Payer: Self-pay | Admitting: Psychiatry

## 2018-09-19 VITALS — BP 110/80 | HR 92 | Ht 65.0 in | Wt 278.0 lb

## 2018-09-19 DIAGNOSIS — F411 Generalized anxiety disorder: Secondary | ICD-10-CM

## 2018-09-19 DIAGNOSIS — F200 Paranoid schizophrenia: Secondary | ICD-10-CM

## 2018-09-19 DIAGNOSIS — F4310 Post-traumatic stress disorder, unspecified: Secondary | ICD-10-CM | POA: Diagnosis not present

## 2018-09-19 MED ORDER — ESCITALOPRAM OXALATE 10 MG PO TABS: 10.0000 mg | ORAL_TABLET | Freq: Every day | ORAL | 1 refills | Status: DC
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
## 2018-10-07 ENCOUNTER — Encounter

## 2018-10-19 ENCOUNTER — Ambulatory Visit (HOSPITAL_COMMUNITY): Payer: Medicaid Other | Admitting: Psychiatry

## 2018-11-17 ENCOUNTER — Other Ambulatory Visit (HOSPITAL_COMMUNITY): Payer: Self-pay | Admitting: Psychiatry

## 2018-12-28 ENCOUNTER — Other Ambulatory Visit: Payer: Self-pay

## 2018-12-28 ENCOUNTER — Ambulatory Visit (INDEPENDENT_AMBULATORY_CARE_PROVIDER_SITE_OTHER): Payer: Medicaid Other

## 2018-12-28 VITALS — BP 138/70 | Ht 62.0 in | Wt 272.1 lb

## 2018-12-28 DIAGNOSIS — E1165 Type 2 diabetes mellitus with hyperglycemia: Secondary | ICD-10-CM | POA: Diagnosis not present

## 2018-12-28 DIAGNOSIS — Z3201 Encounter for pregnancy test, result positive: Secondary | ICD-10-CM

## 2018-12-28 LAB — POCT URINE PREGNANCY: Preg Test, Ur: POSITIVE — AB

## 2018-12-28 LAB — GLUCOSE, POCT (MANUAL RESULT ENTRY): POC GLUCOSE: 208 mg/dL — AB (ref 70–99)

## 2018-12-28 MED ORDER — METFORMIN HCL 1000 MG PO TABS: 1000.0000 mg | ORAL_TABLET | Freq: Two times a day (BID) | ORAL | 1 refills | Status: DC

## 2018-12-28 MED ORDER — FAMOTIDINE 20 MG PO TABS: 20.0000 mg | ORAL_TABLET | Freq: Two times a day (BID) | ORAL | 1 refills | Status: DC

## 2018-12-28 MED ORDER — DOXYLAMINE-PYRIDOXINE 10-10 MG PO TBEC: 1.0000 | DELAYED_RELEASE_TABLET | Freq: Every day | ORAL | 2 refills | Status: DC

## 2018-12-28 NOTE — Patient Instructions (Signed)

## 2018-12-28 NOTE — Progress Notes (Signed)
History:  Anna Jordan is a 28 y.o. G3P1011 who presents to clinic today for pregnancy confirmation and management of diabetes. She reports an LMP of 10/29/2018. She is unsure about this LMP because she had an episode of bleeding in December. She states she "always has gestational diabetes" so she was checking her CBGs at home and found that her fasting sugars were in the 400s. She reports intermittent nausea and vomiting due to morning sickness. She denies any bleeding or discharge now. Denies any pain.   She is here to start management of her blood sugars.   The following portions of the patient's history were reviewed and updated as appropriate: allergies, current medications, family history, past medical history, social history, past surgical history and problem list.  Review of Systems:  Review of Systems  Constitutional: Negative.  Negative for chills and fever.  Respiratory: Negative.   Cardiovascular: Negative.  Negative for chest pain.  Genitourinary: Negative.   Neurological: Negative.  Negative for dizziness and headaches.      Objective:  Physical Exam BP 138/70   Ht 5\' 2"  (1.575 m)   Wt 272 lb 1.9 oz (123.4 kg)   LMP 10/29/2018   BMI 49.77 kg/m  Physical Exam Vitals signs and nursing note reviewed.  Constitutional:      General: She is not in acute distress.    Appearance: She is well-developed.  HENT:     Head: Normocephalic.  Eyes:     Pupils: Pupils are equal, round, and reactive to light.  Neck:     Musculoskeletal: Normal range of motion.  Cardiovascular:     Rate and Rhythm: Normal rate and regular rhythm.  Pulmonary:     Effort: Pulmonary effort is normal. No respiratory distress.     Breath sounds: Normal breath sounds.  Abdominal:     Palpations: Abdomen is soft.     Tenderness: There is no abdominal tenderness.  Musculoskeletal: Normal range of motion.  Skin:    General: Skin is warm and dry.  Neurological:     Mental Status: She is alert  and oriented to person, place, and time.  Psychiatric:        Behavior: Behavior normal.        Thought Content: Thought content normal.        Judgment: Judgment normal.      Labs and Imaging Results for orders placed or performed in visit on 12/28/18 (from the past 24 hour(s))  POCT urine pregnancy     Status: Abnormal   Collection Time: 12/28/18 10:15 AM  Result Value Ref Range   Preg Test, Ur Positive (A) Negative  POCT Glucose (CBG)     Status: Abnormal   Collection Time: 12/28/18 10:15 AM  Result Value Ref Range   POC Glucose 208 (A) 70 - 99 mg/dl    Assessment & Plan:  1. Pregnancy confirmed by positive urine test - POCT urine pregnancy - POCT Glucose (CBG)  2. Uncontrolled type 2 diabetes mellitus with hyperglycemia (HCC) - Consulted with Dr. Macon Large- will start patient on 1000mg  metformin BID and schedule patient for dating u/s to confirm IUP. Discussed with patient CBG monitoring at home and when to call. Patient understands normalcy of GI upset with starting metformin. Will call office if metformin does not control CBGs before next appointment. New OB appointment scheduled 01/15/2019   Rolm Bookbinder, CNM 12/28/2018 9:26 AM

## 2019-01-08 ENCOUNTER — Ambulatory Visit (HOSPITAL_COMMUNITY)
Admission: RE | Admit: 2019-01-08 | Discharge: 2019-01-08 | Disposition: A | Discharge status: Home / Self Care | Payer: Medicaid Other | Source: Ambulatory Visit

## 2019-01-08 DIAGNOSIS — Z3201 Encounter for pregnancy test, result positive: Secondary | ICD-10-CM | POA: Diagnosis present

## 2019-01-08 DIAGNOSIS — E1165 Type 2 diabetes mellitus with hyperglycemia: Secondary | ICD-10-CM | POA: Insufficient documentation

## 2019-01-15 ENCOUNTER — Encounter: Payer: Self-pay | Admitting: Certified Nurse Midwife

## 2019-01-15 ENCOUNTER — Ambulatory Visit (INDEPENDENT_AMBULATORY_CARE_PROVIDER_SITE_OTHER): Payer: Medicaid Other | Admitting: Certified Nurse Midwife

## 2019-01-15 ENCOUNTER — Other Ambulatory Visit (HOSPITAL_COMMUNITY)
Admission: RE | Admit: 2019-01-15 | Discharge: 2019-01-15 | Disposition: A | Discharge status: Home / Self Care | Payer: Medicaid Other | Source: Ambulatory Visit | Attending: Certified Nurse Midwife | Admitting: Certified Nurse Midwife

## 2019-01-15 VITALS — BP 134/79 | HR 86 | Wt 273.0 lb

## 2019-01-15 DIAGNOSIS — O09291 Supervision of pregnancy with other poor reproductive or obstetric history, first trimester: Secondary | ICD-10-CM | POA: Diagnosis not present

## 2019-01-15 DIAGNOSIS — Z3480 Encounter for supervision of other normal pregnancy, unspecified trimester: Secondary | ICD-10-CM

## 2019-01-15 DIAGNOSIS — Z3A11 11 weeks gestation of pregnancy: Secondary | ICD-10-CM

## 2019-01-15 DIAGNOSIS — O24111 Pre-existing diabetes mellitus, type 2, in pregnancy, first trimester: Secondary | ICD-10-CM

## 2019-01-15 DIAGNOSIS — O219 Vomiting of pregnancy, unspecified: Secondary | ICD-10-CM

## 2019-01-15 DIAGNOSIS — O24119 Pre-existing diabetes mellitus, type 2, in pregnancy, unspecified trimester: Secondary | ICD-10-CM

## 2019-01-15 DIAGNOSIS — O09299 Supervision of pregnancy with other poor reproductive or obstetric history, unspecified trimester: Secondary | ICD-10-CM

## 2019-01-15 MED ORDER — PROMETHAZINE HCL 25 MG PO TABS: 25.0000 mg | ORAL_TABLET | Freq: Four times a day (QID) | ORAL | 1 refills | Status: DC | PRN

## 2019-01-15 MED ORDER — ASPIRIN EC 81 MG PO TBEC: 81.0000 mg | DELAYED_RELEASE_TABLET | Freq: Every day | ORAL | 0 refills | Status: DC

## 2019-01-15 NOTE — Progress Notes (Signed)
Bedside U/S shows single IUP with FHT of 158 BPM and CRL measures 38.39mm  GA [redacted]w[redacted]d

## 2019-01-15 NOTE — Progress Notes (Addendum)
Subjective:   Anna Jordan is a 28 y.o. G1W2993 at [redacted]w[redacted]d by LMP being seen today for her first obstetrical visit.  Her obstetrical history is significant for large for gestational age, obesity, pregnancy induced hypertension, pre-eclampsia and former smoker and gestational diabetic and has Generalized anxiety disorder; History of vertebral compression fracture; Tobacco use disorder; and Insulin controlled White classification A2 gestational diabetes mellitus (GDM) on their problem list.. Patient does intend to breast feed. Pregnancy history fully reviewed.  Patient reports nausea.  HISTORY: OB History  Gravida Para Term Preterm AB Living  5 2 1 1 2 2   SAB TAB Ectopic Multiple Live Births  2 0 0 0 2    # Outcome Date GA Lbr Len/2nd Weight Sex Delivery Anes PTL Lv  5 Current           4 Preterm 09/27/17 [redacted]w[redacted]d  7 lb 6 oz (3.345 kg) M Vag-Spont EPI N LIV  3 Term 08/08/11   9 lb 7 oz (4.281 kg) M Vag-Spont   LIV     Apgar1: 8    2 SAB           1 SAB            Past Medical History:  Diagnosis Date  . Anxiety   . Candida vaginitis 06/18/2017  . Depression   . Gestational diabetes   . Hypertension   . Obesity   . Schizophrenia [facility])    Past Surgical History:  Procedure Laterality Date  . GALLBLADDER SURGERY  08/2018  . WISDOM TOOTH EXTRACTION     Family History  Problem Relation Age of Onset  . Depression Mother   . Diabetes Mother   . Heart disease Mother   . Stroke Father   . Hypertension Father   . Breast cancer Maternal Aunt        4 mat aunts with breast cancer  . Diabetes Brother   . Breast cancer Maternal Grandmother   . Breast cancer Paternal Grandmother   . Breast cancer Paternal Grandfather    Social History   Tobacco Use  . Smoking status: Former Smoker    Packs/day: 0.50    Years: 15.00    Pack years: 7.50    Types: Cigarettes    Last attempt to quit: 03/31/2017    Years since quitting: 1.7  . Smokeless tobacco: Never Used  . Tobacco comment:  Quit previously x 1 year, Prior to quittting this time, smoked 0.5-1ppd  Substance Use Topics  . Alcohol use: No  . Drug use: No   Allergies  Allergen Reactions  . Hydrocodone Hives  . Morphine Hives, Rash and Other (See Comments)    You can tolerate with Benadryl  . Tramadol Hives  . Penicillin G Rash and Other (See Comments)    Has patient had a PCN reaction causing immediate rash, facial/tongue/throat swelling, SOB or lightheadedness with hypotension: unknown Has patient had a PCN reaction causing severe rash involving mucus membranes or skin necrosis: unknown Has patient had a PCN reaction that required hospitalization: unknown Has patient had a PCN reaction occurring within the last 10 years: no If all of the above answers are "NO", then may proceed with Cephalosporin use.    Current Medications: Metformin 1000mg  po bid Prenatal Vitamins 1 tablet po daily  Exam   Vitals:   01/15/19 0809  BP: 134/79  Pulse: 86  Weight: 273 lb (123.8 kg)   Fetal Heart Rate (bpm): 158  Uterus:  Pelvic Exam: Perineum: no hemorrhoids, normal perineum   Vulva: normal external genitalia, no lesions   Vagina:  normal mucosa, normal discharge   Cervix: no lesions and normal, pap smear done.    Adnexa: normal adnexa and no mass, fullness, tenderness   Bony Pelvis: average  System: General: well-developed, well-nourished female in no acute distress   Breast:  normal appearance, no masses or tenderness   Skin: normal coloration and turgor, no rashes   Neurologic: oriented, normal, negative, normal mood   Extremities: normal strength, tone, and muscle mass, ROM of all joints is normal   HEENT PERRLA, extraocular movement intact and sclera clear, anicteric   Mouth/Teeth mucous membranes moist, pharynx normal without lesions and dental hygiene good   Neck supple and no masses   Cardiovascular: regular rate and rhythm   Respiratory:  no respiratory distress, normal breath sounds   Abdomen:  soft, non-tender; bowel sounds normal; no masses,  no organomegaly     Assessment:   Pregnancy: [id number] Patient Active Problem List   Diagnosis Date Noted  . Insulin controlled White classification A2 gestational diabetes mellitus (GDM) 03/17/2017  . Generalized anxiety disorder 10/24/2016  . History of vertebral compression fracture 10/24/2016  . Tobacco use disorder 10/24/2016     Plan:  1. Supervision of other normal pregnancy, antepartum Discussed healthy weight gain, nutrition, blood sugar control, physical activity, and increased fluid requirements in pregnancy. Blood sugars rangings 100-130s now on metformin.  Continue taking metformin and PNV, checking blood sugars, and monitoring carbohydrate intake.  Routine prenatal care.  Anticipatory guidance provided.  F/U in 4 weeks. - Obstetric panel - HIV antibody (with reflex) - Culture, OB Urine - GC/Chlamydia probe amp ([place])not at [facility] - [place] bedside; Future - HgB A1c  2. Hx of pre-eclampsia in prior pregnancy, currently pregnant Hx of CHTN prior to pregnancy.  Was on Labetalol and also had superimposed pre-eclampsia requiring preterm IOL and delivery at 36.5 weeks with last pregnancy.  Start baby aspirin daily @ 12 weeks. - aspirin EC 81 MG tablet; Take 1 tablet (81 mg total) by mouth daily. Take after 12 weeks for prevention of preeclampsia later in pregnancy  Dispense: 300 tablet; Refill: 0  3. Nausea and vomiting during pregnancy Used Diclegis in previous pregnancy, but current insurance won't cover and it is expensive.  Plan to start Phenergan for nausea/vomitting today.  Once patient's insurance changes, she would like a prescription for Diclegis sent.  Discussed maintaining adequate hydration and eating small meals or snacks thoroughout the day.  Avoid foods that are triggers for nausea/vomitting. - promethazine (PHENERGAN) 25 MG tablet; Take 1 tablet (25 mg total) by mouth every 6 (six) hours as needed for nausea or  vomiting.  Dispense: 30 tablet; Refill: 1   Initial labs drawn. Continue prenatal vitamins. Genetic Screening discussed, First trimester screen, Quad screen and NIPS: declined. Ultrasound discussed; fetal anatomic survey: requested. Problem list reviewed and updated. The nature of [facility] with multiple MDs and other Advanced Practice Providers was explained to patient; also emphasized that residents, students are part of our team. Routine obstetric precautions reviewed. Return in about 4 weeks (around 02/12/2019).   Louis Matte Earlene Plater, SNP 01/15/19 9:33 AM

## 2019-01-15 NOTE — Patient Instructions (Signed)

## 2019-01-16 DIAGNOSIS — O09299 Supervision of pregnancy with other poor reproductive or obstetric history, unspecified trimester: Secondary | ICD-10-CM | POA: Insufficient documentation

## 2019-01-16 DIAGNOSIS — O0990 Supervision of high risk pregnancy, unspecified, unspecified trimester: Secondary | ICD-10-CM | POA: Insufficient documentation

## 2019-01-16 DIAGNOSIS — O219 Vomiting of pregnancy, unspecified: Secondary | ICD-10-CM | POA: Insufficient documentation

## 2019-01-16 DIAGNOSIS — O24119 Pre-existing diabetes mellitus, type 2, in pregnancy, unspecified trimester: Secondary | ICD-10-CM | POA: Insufficient documentation

## 2019-01-16 DIAGNOSIS — Z3480 Encounter for supervision of other normal pregnancy, unspecified trimester: Principal | ICD-10-CM

## 2019-01-16 LAB — OBSTETRIC PANEL
Absolute Monocytes: 480 cells/uL (ref 200–950)
Antibody Screen: NOT DETECTED
Basophils Absolute: 70 cells/uL (ref 0–200)
Basophils Relative: 0.7 %
Eosinophils Absolute: 80 cells/uL (ref 15–500)
Eosinophils Relative: 0.8 %
HCT: 38.5 % (ref 35.0–45.0)
Hemoglobin: 13.3 g/dL (ref 11.7–15.5)
Hepatitis B Surface Ag: NONREACTIVE
Lymphs Abs: 2540 cells/uL (ref 850–3900)
MCH: 30.4 pg (ref 27.0–33.0)
MCHC: 34.5 g/dL (ref 32.0–36.0)
MCV: 88.1 fL (ref 80.0–100.0)
MPV: 10.8 fL (ref 7.5–12.5)
Monocytes Relative: 4.8 %
Neutro Abs: 6830 cells/uL (ref 1500–7800)
Neutrophils Relative %: 68.3 %
Platelets: 299 10*3/uL (ref 140–400)
RBC: 4.37 10*6/uL (ref 3.80–5.10)
RDW: 13.8 % (ref 11.0–15.0)
RPR Ser Ql: NONREACTIVE
Rubella: <0.9 index — ABNORMAL LOW
Total Lymphocyte: 25.4 %
WBC: 10 10*3/uL (ref 3.8–10.8)

## 2019-01-16 LAB — HEMOGLOBIN A1C
Hgb A1c MFr Bld: 7.2 % of total Hgb — ABNORMAL HIGH (ref <5.7)
Mean Plasma Glucose: 160 (calc)
eAG (mmol/L): 8.9 (calc)

## 2019-01-16 LAB — HIV ANTIBODY (ROUTINE TESTING W REFLEX): HIV 1&2 Ab, 4th Generation: NONREACTIVE

## 2019-01-17 LAB — GC/CHLAMYDIA PROBE AMP (~~LOC~~) NOT AT ARMC
Chlamydia: NEGATIVE
Neisseria Gonorrhea: NEGATIVE

## 2019-01-17 LAB — CULTURE, OB URINE

## 2019-01-17 LAB — URINE CULTURE, OB REFLEX

## 2019-02-11 ENCOUNTER — Encounter: Payer: Self-pay | Admitting: Obstetrics & Gynecology

## 2019-02-11 ENCOUNTER — Ambulatory Visit (INDEPENDENT_AMBULATORY_CARE_PROVIDER_SITE_OTHER): Payer: Self-pay | Admitting: Obstetrics & Gynecology

## 2019-02-11 ENCOUNTER — Other Ambulatory Visit: Payer: Self-pay

## 2019-02-11 DIAGNOSIS — O09292 Supervision of pregnancy with other poor reproductive or obstetric history, second trimester: Secondary | ICD-10-CM

## 2019-02-11 DIAGNOSIS — Z3480 Encounter for supervision of other normal pregnancy, unspecified trimester: Secondary | ICD-10-CM

## 2019-02-11 DIAGNOSIS — O24414 Gestational diabetes mellitus in pregnancy, insulin controlled: Secondary | ICD-10-CM

## 2019-02-11 DIAGNOSIS — Z3A15 15 weeks gestation of pregnancy: Secondary | ICD-10-CM

## 2019-02-11 DIAGNOSIS — O09299 Supervision of pregnancy with other poor reproductive or obstetric history, unspecified trimester: Secondary | ICD-10-CM

## 2019-02-11 MED ORDER — INSULIN SYRINGES (DISPOSABLE) U-100 0.3 ML MISC: 1.0000 | Freq: Four times a day (QID) | 12 refills | Status: DC | PRN

## 2019-02-11 MED ORDER — INSULIN NPH (HUMAN) (ISOPHANE) 100 UNIT/ML ~~LOC~~ SUSP: SUBCUTANEOUS | 6 refills | Status: DC

## 2019-02-11 MED ORDER — INSULIN REGULAR HUMAN 100 UNIT/ML IJ SOLN: INTRAMUSCULAR | 11 refills | Status: DC

## 2019-02-11 MED ORDER — GLUCOSE BLOOD VI STRP: ORAL_STRIP | 12 refills | Status: DC

## 2019-02-11 NOTE — Progress Notes (Signed)
   TELEHEALTH VIRTUAL OBSTETRICS VISIT ENCOUNTER NOTE  I connected with Anna Jordan on 02/11/19 at  8:15 AM EDT by telephone at home and verified that I am speaking with the correct person using two identifiers.   I discussed the limitations, risks, security and privacy concerns of performing an evaluation and management service by telephone and the availability of in person appointments. I also discussed with the patient that there may be a patient responsible charge related to this service. The patient expressed understanding and agreed to proceed.  Subjective:  Anna Jordan is a 28 y.o. [id number] at [redacted]w[redacted]d being followed for ongoing prenatal care.  She is currently monitored for the following issues for this high-risk pregnancy and has Generalized anxiety disorder; History of vertebral compression fracture; Tobacco use disorder; Insulin controlled White classification A2 gestational diabetes mellitus (GDM); Hx of pre-eclampsia in prior pregnancy, currently pregnant; Supervision of other normal pregnancy, antepartum; Nausea and vomiting during pregnancy; and Type 2 diabetes mellitus affecting pregnancy, antepartum on their problem list.  Patient reports no complaints. Reports fetal movement. Denies any contractions, bleeding or leaking of fluid.   The following portions of the patient's history were reviewed and updated as appropriate: allergies, current medications, past family history, past medical history, past social history, past surgical history and problem list.   Objective:   General:  Alert, oriented and cooperative.   Mental Status: Normal mood and affect perceived. Normal judgment and thought content.  Rest of physical exam deferred due to type of encounter  Assessment and Plan:  Pregnancy: [id number] at [redacted]w[redacted]d 1. Insulin controlled White classification A2 gestational diabetes mellitus (GDM) - She reports fastings are generally between 98-115, 2 hour PC after breakfast generally  around 140, after lunch 110, after supper- around 95 (no carbs at supper) - she is taking metformin 1000 mg - She has been on insulin in the past - I will have her start on - Babyscripts Schedule Optimization - [place] MFM OB COMP + 14 WK; Future  2. Hx of pre-eclampsia in prior pregnancy, currently pregnant  - Babyscripts Schedule Optimization - [place] MFM OB COMP + 14 WK; Future - Comprehensive metabolic panel; Future - Protein / creatinine ratio, urine  3. Supervision of other normal pregnancy, antepartum  - Babyscripts Schedule Optimization - [place] MFM OB COMP + 14 WK; Future  Preterm labor symptoms and general obstetric precautions including but not limited to vaginal bleeding, contractions, leaking of fluid and fetal movement were reviewed in detail with the patient.  I discussed the assessment and treatment plan with the patient. The patient was provided an opportunity to ask questions and all were answered. The patient agreed with the plan and demonstrated an understanding of the instructions. The patient was advised to call back or seek an in-person office evaluation/go to [facility]U at [facility] for any urgent or concerning symptoms. Please refer to After Visit Summary for other counseling recommendations.   No follow-ups on file.  No future appointments.  Allie Bossier, MD Center for [employer], [facility]

## 2019-02-12 ENCOUNTER — Telehealth: Payer: Self-pay

## 2019-02-12 ENCOUNTER — Other Ambulatory Visit: Payer: Self-pay

## 2019-02-12 DIAGNOSIS — O0990 Supervision of high risk pregnancy, unspecified, unspecified trimester: Secondary | ICD-10-CM

## 2019-02-12 DIAGNOSIS — Z3490 Encounter for supervision of normal pregnancy, unspecified, unspecified trimester: Secondary | ICD-10-CM | POA: Insufficient documentation

## 2019-02-12 NOTE — Progress Notes (Signed)
Pt has not gotten email from babyscripts yet. Order placed.

## 2019-02-12 NOTE — Telephone Encounter (Signed)
Called pt to see if she had set up babyscripts app yet. Pt has not received email yet. I put in another order and told pt to let me know if she doesn't have the email by tomorrow. Pt is aware to come pick BP cuff up from office.

## 2019-02-18 ENCOUNTER — Other Ambulatory Visit: Payer: Self-pay | Admitting: *Deleted

## 2019-02-18 MED ORDER — PRENATAL 27-0.8 MG PO TABS: 1.0000 | ORAL_TABLET | Freq: Every day | ORAL | 10 refills | Status: AC

## 2019-02-19 ENCOUNTER — Other Ambulatory Visit: Payer: Self-pay

## 2019-02-19 ENCOUNTER — Ambulatory Visit (INDEPENDENT_AMBULATORY_CARE_PROVIDER_SITE_OTHER): Payer: Medicaid Other

## 2019-02-19 DIAGNOSIS — O09292 Supervision of pregnancy with other poor reproductive or obstetric history, second trimester: Secondary | ICD-10-CM

## 2019-02-19 DIAGNOSIS — O09299 Supervision of pregnancy with other poor reproductive or obstetric history, unspecified trimester: Secondary | ICD-10-CM

## 2019-02-19 DIAGNOSIS — Z3490 Encounter for supervision of normal pregnancy, unspecified, unspecified trimester: Secondary | ICD-10-CM

## 2019-02-19 NOTE — Progress Notes (Addendum)
PT here for lab only. Declined flu shot. Babyscripts BP cuff given and connected to app.

## 2019-02-20 LAB — COMPREHENSIVE METABOLIC PANEL
AG Ratio: 1.5 (calc) (ref 1.0–2.5)
ALT: 9 U/L (ref 6–29)
AST: 9 U/L — ABNORMAL LOW (ref 10–30)
Albumin: 3.7 g/dL (ref 3.6–5.1)
Alkaline phosphatase (APISO): 47 U/L (ref 31–125)
BUN/Creatinine Ratio: 10 (calc) (ref 6–22)
BUN: 6 mg/dL — ABNORMAL LOW (ref 7–25)
CO2: 25 mmol/L (ref 20–32)
Calcium: 9.1 mg/dL (ref 8.6–10.2)
Chloride: 106 mmol/L (ref 98–110)
Creat: 0.59 mg/dL (ref 0.50–1.10)
Globulin: 2.5 g/dL (calc) (ref 1.9–3.7)
Glucose, Bld: 150 mg/dL — ABNORMAL HIGH (ref 65–99)
Potassium: 4.1 mmol/L (ref 3.5–5.3)
Sodium: 141 mmol/L (ref 135–146)
Total Bilirubin: 0.2 mg/dL (ref 0.2–1.2)
Total Protein: 6.2 g/dL (ref 6.1–8.1)

## 2019-02-20 LAB — PROTEIN / CREATININE RATIO, URINE
Creatinine, Urine: 105 mg/dL (ref 20–275)
Protein/Creat Ratio: 114 mg/g creat (ref 21–161)
Protein/Creatinine Ratio: 0.114 mg/mg creat (ref 0.021–0.16)
Total Protein, Urine: 12 mg/dL (ref 5–24)

## 2019-03-12 ENCOUNTER — Other Ambulatory Visit: Payer: Self-pay

## 2019-03-12 ENCOUNTER — Ambulatory Visit (HOSPITAL_COMMUNITY): Payer: Medicaid Other

## 2019-03-12 ENCOUNTER — Ambulatory Visit (HOSPITAL_COMMUNITY): Payer: Medicaid Other | Admitting: *Deleted

## 2019-03-12 ENCOUNTER — Encounter (HOSPITAL_COMMUNITY): Payer: Self-pay

## 2019-03-12 ENCOUNTER — Other Ambulatory Visit: Payer: Self-pay | Admitting: Obstetrics & Gynecology

## 2019-03-12 ENCOUNTER — Ambulatory Visit (HOSPITAL_COMMUNITY)
Admission: RE | Admit: 2019-03-12 | Discharge: 2019-03-12 | Disposition: A | Discharge status: Home / Self Care | Payer: Medicaid Other | Source: Ambulatory Visit | Attending: Obstetrics and Gynecology | Admitting: Obstetrics and Gynecology

## 2019-03-12 VITALS — BP 123/66 | HR 91 | Temp 98.7°F

## 2019-03-12 DIAGNOSIS — O09212 Supervision of pregnancy with history of pre-term labor, second trimester: Secondary | ICD-10-CM | POA: Diagnosis not present

## 2019-03-12 DIAGNOSIS — O99212 Obesity complicating pregnancy, second trimester: Secondary | ICD-10-CM | POA: Diagnosis not present

## 2019-03-12 DIAGNOSIS — O09299 Supervision of pregnancy with other poor reproductive or obstetric history, unspecified trimester: Secondary | ICD-10-CM | POA: Diagnosis present

## 2019-03-12 DIAGNOSIS — Z8759 Personal history of other complications of pregnancy, childbirth and the puerperium: Secondary | ICD-10-CM | POA: Insufficient documentation

## 2019-03-12 DIAGNOSIS — Z3480 Encounter for supervision of other normal pregnancy, unspecified trimester: Secondary | ICD-10-CM | POA: Insufficient documentation

## 2019-03-12 DIAGNOSIS — O24414 Gestational diabetes mellitus in pregnancy, insulin controlled: Secondary | ICD-10-CM | POA: Insufficient documentation

## 2019-03-12 DIAGNOSIS — O24012 Pre-existing diabetes mellitus, type 1, in pregnancy, second trimester: Secondary | ICD-10-CM | POA: Diagnosis not present

## 2019-03-12 DIAGNOSIS — Z3A19 19 weeks gestation of pregnancy: Secondary | ICD-10-CM

## 2019-03-12 DIAGNOSIS — O09292 Supervision of pregnancy with other poor reproductive or obstetric history, second trimester: Secondary | ICD-10-CM | POA: Diagnosis not present

## 2019-03-12 DIAGNOSIS — Z363 Encounter for antenatal screening for malformations: Secondary | ICD-10-CM

## 2019-03-13 ENCOUNTER — Other Ambulatory Visit (HOSPITAL_COMMUNITY): Payer: Self-pay | Admitting: *Deleted

## 2019-03-13 DIAGNOSIS — Z362 Encounter for other antenatal screening follow-up: Secondary | ICD-10-CM

## 2019-03-25 ENCOUNTER — Other Ambulatory Visit: Payer: Self-pay

## 2019-03-25 ENCOUNTER — Ambulatory Visit (INDEPENDENT_AMBULATORY_CARE_PROVIDER_SITE_OTHER): Payer: Medicaid Other | Admitting: Obstetrics & Gynecology

## 2019-03-25 VITALS — BP 124/70 | HR 93 | Wt 271.0 lb

## 2019-03-25 DIAGNOSIS — O219 Vomiting of pregnancy, unspecified: Secondary | ICD-10-CM | POA: Diagnosis not present

## 2019-03-25 DIAGNOSIS — O09291 Supervision of pregnancy with other poor reproductive or obstetric history, first trimester: Secondary | ICD-10-CM

## 2019-03-25 DIAGNOSIS — Z3A21 21 weeks gestation of pregnancy: Secondary | ICD-10-CM | POA: Diagnosis not present

## 2019-03-25 DIAGNOSIS — O24111 Pre-existing diabetes mellitus, type 2, in pregnancy, first trimester: Secondary | ICD-10-CM

## 2019-03-25 DIAGNOSIS — O24119 Pre-existing diabetes mellitus, type 2, in pregnancy, unspecified trimester: Secondary | ICD-10-CM

## 2019-03-25 DIAGNOSIS — O09299 Supervision of pregnancy with other poor reproductive or obstetric history, unspecified trimester: Secondary | ICD-10-CM | POA: Insufficient documentation

## 2019-03-25 NOTE — Progress Notes (Signed)
NPH 26 in a.m. Reg in a.m. 22   Reg at dinner 33 NPH at night 20  Fastings 95-140 Post breakfast all less then 120 Post lunch 110-120 Post dinner 110-120    PRENATAL VISIT NOTE  Subjective:  Anna Jordan is a 28 y.o. [id number] at 3535w0d being seen today for ongoing prenatal care.  She is currently monitored for the following issues for this high-risk pregnancy and has Generalized anxiety disorder; History of vertebral compression fracture; Insulin controlled White classification A2 gestational diabetes mellitus (GDM); Hx of pre-eclampsia in prior pregnancy, currently pregnant; Supervision of other normal pregnancy, antepartum; Nausea and vomiting during pregnancy; Type 2 diabetes mellitus affecting pregnancy, antepartum; Late Entry to Babyscripts- March 2020- Social Distancing; Class 3 severe obesity due to excess calories with body mass index (BMI) of 50.0 to 59.9 in adult [facility](HCC); Lumbar facet joint pain; Spondylosis of lumbar spine; Sacroiliitis (HCC); Mild intermittent asthma with acute exacerbation; and History of shoulder dystocia in prior pregnancy, currently pregnant on their problem list.  Patient reports no complaints.  Contractions: Not present. Vag. Bleeding: None.  Movement: Present. Denies leaking of fluid.   The following portions of the patient's history were reviewed and updated as appropriate: allergies, current medications, past family history, past medical history, past social history, past surgical history and problem list.   Objective:   Vitals:   03/25/19 1115  BP: 124/70  Pulse: 93  Weight: 271 lb (122.9 kg)    Fetal Status: Fetal Heart Rate (bpm): 153   Movement: Present     General:  Alert, oriented and cooperative. Patient is in no acute distress.  Skin: Skin is warm and dry. No rash noted.   Cardiovascular: Normal heart rate noted  Respiratory: Normal respiratory effort, no problems with respiration noted  Abdomen: Soft, gravid, appropriate for  gestational age.  Pain/Pressure: Absent     Pelvic: Cervical exam deferred        Extremities: Normal range of motion.  Edema: None  Mental Status: Normal mood and affect. Normal behavior. Normal judgment and thought content.   Assessment and Plan:  Pregnancy: [id number] at 1035w0d 1. Nausea and vomiting during pregnancy Doing well and not on meds  2. History of shoulder dystocia in prior pregnancy, currently pregnant 9 pounds 7 ounces--baby is fine  3.  Type 2 DM NPH 26 in a.m. Reg in a.m. 22   Reg at dinner 33 NPH at night 20  Fastings 95-140 Post breakfast all less then 120 Post lunch 110-120 Post dinner 110-120  Could be dropping in middle of the night.  Not eating bedtime snack and walking in the evenings.  Pt has elf increased NPH over the past few weeks and fasting readings are not improving.  Pt will eat protein bedtime snack and check CBG at 3 am.  She will call us Wednesday and report CBGs.  Her babyscripts has not been downloaded to her new phone and we emailed [employer].    4.  History of Pre E Continue Baby Rx HTN monitoring Continue ASA daily.  Preterm labor symptoms and general obstetric precautions including but not limited to vaginal bleeding, contractions, leaking of fluid and fetal movement were reviewed in detail with the patient. Please refer to After Visit Summary for other counseling recommendations.   Return in about 4 weeks (around 04/22/2019).  Virtual Visit--make sure [facility] is working.    Future Appointments  Date Time Provider Department Center  04/10/2019  3:45 PM [facility] US 2 [facility]US  04/10/2019  3:50 PM [facility] NURSE [facility]-US    Elsie Lincoln, MD

## 2019-04-02 ENCOUNTER — Telehealth: Payer: Self-pay | Admitting: *Deleted

## 2019-04-02 MED ORDER — ACCU-CHEK FASTCLIX LANCETS MISC: 1.0000 | Freq: Four times a day (QID) | 12 refills | Status: AC

## 2019-04-02 MED ORDER — ACCU-CHEK AVIVA DEVI: 0 refills | Status: AC

## 2019-04-02 MED ORDER — GLUCOSE BLOOD VI STRP: ORAL_STRIP | 12 refills | Status: DC

## 2019-04-02 NOTE — Telephone Encounter (Signed)
Pt called stating that her glucose monitor has stopped working.  She has replaced the batteries x2 but the device will not come on.  She is requesting a new meter and lancets, strips.  This was sent to [facility] in [place].

## 2019-04-03 ENCOUNTER — Telehealth: Payer: Self-pay

## 2019-04-03 NOTE — Telephone Encounter (Signed)
Pt called stating that Rx was sent for blood glucose monitor and that pharmacy could not give it to her because they needed a diagnosis code. I spoke with the pharmacist at [facility] and he stated that he did not need anything from our office but he needed the pt needed to go to [employer] and fill out the form for the meter. I attempted to call pt with this information but she did not answer. MyChart message sent.

## 2019-04-10 ENCOUNTER — Ambulatory Visit (HOSPITAL_COMMUNITY): Payer: Medicaid Other

## 2019-04-10 ENCOUNTER — Ambulatory Visit (HOSPITAL_COMMUNITY): Admission: RE | Admit: 2019-04-10 | Payer: Medicaid Other | Source: Ambulatory Visit

## 2019-04-18 ENCOUNTER — Ambulatory Visit (HOSPITAL_COMMUNITY): Payer: Medicaid Other | Admitting: *Deleted

## 2019-04-18 ENCOUNTER — Other Ambulatory Visit: Payer: Self-pay

## 2019-04-18 ENCOUNTER — Encounter (HOSPITAL_COMMUNITY): Payer: Self-pay

## 2019-04-18 ENCOUNTER — Ambulatory Visit (HOSPITAL_COMMUNITY)
Admission: RE | Admit: 2019-04-18 | Discharge: 2019-04-18 | Disposition: A | Discharge status: Home / Self Care | Payer: Medicaid Other | Source: Ambulatory Visit | Attending: Maternal & Fetal Medicine | Admitting: Maternal & Fetal Medicine

## 2019-04-18 VITALS — BP 113/54 | HR 88 | Temp 98.4°F

## 2019-04-18 DIAGNOSIS — O09212 Supervision of pregnancy with history of pre-term labor, second trimester: Secondary | ICD-10-CM | POA: Diagnosis not present

## 2019-04-18 DIAGNOSIS — O99212 Obesity complicating pregnancy, second trimester: Secondary | ICD-10-CM | POA: Diagnosis not present

## 2019-04-18 DIAGNOSIS — O09292 Supervision of pregnancy with other poor reproductive or obstetric history, second trimester: Secondary | ICD-10-CM | POA: Diagnosis not present

## 2019-04-18 DIAGNOSIS — O219 Vomiting of pregnancy, unspecified: Secondary | ICD-10-CM | POA: Diagnosis present

## 2019-04-18 DIAGNOSIS — Z3A24 24 weeks gestation of pregnancy: Secondary | ICD-10-CM

## 2019-04-18 DIAGNOSIS — O24119 Pre-existing diabetes mellitus, type 2, in pregnancy, unspecified trimester: Secondary | ICD-10-CM

## 2019-04-18 DIAGNOSIS — O4402 Placenta previa specified as without hemorrhage, second trimester: Secondary | ICD-10-CM

## 2019-04-18 DIAGNOSIS — Z362 Encounter for other antenatal screening follow-up: Secondary | ICD-10-CM | POA: Diagnosis not present

## 2019-04-18 DIAGNOSIS — O24012 Pre-existing diabetes mellitus, type 1, in pregnancy, second trimester: Secondary | ICD-10-CM

## 2019-04-19 ENCOUNTER — Other Ambulatory Visit (HOSPITAL_COMMUNITY): Payer: Self-pay | Admitting: *Deleted

## 2019-04-19 DIAGNOSIS — O24119 Pre-existing diabetes mellitus, type 2, in pregnancy, unspecified trimester: Secondary | ICD-10-CM

## 2019-04-22 ENCOUNTER — Other Ambulatory Visit: Payer: Self-pay

## 2019-04-22 ENCOUNTER — Ambulatory Visit (INDEPENDENT_AMBULATORY_CARE_PROVIDER_SITE_OTHER): Payer: Medicaid Other | Admitting: Obstetrics & Gynecology

## 2019-04-22 VITALS — BP 114/68 | Wt 269.0 lb

## 2019-04-22 DIAGNOSIS — O99212 Obesity complicating pregnancy, second trimester: Secondary | ICD-10-CM

## 2019-04-22 DIAGNOSIS — Z3A25 25 weeks gestation of pregnancy: Secondary | ICD-10-CM

## 2019-04-22 DIAGNOSIS — O09299 Supervision of pregnancy with other poor reproductive or obstetric history, unspecified trimester: Secondary | ICD-10-CM

## 2019-04-22 DIAGNOSIS — E66813 Obesity, class 3: Secondary | ICD-10-CM

## 2019-04-22 DIAGNOSIS — O24414 Gestational diabetes mellitus in pregnancy, insulin controlled: Secondary | ICD-10-CM

## 2019-04-22 DIAGNOSIS — Z3480 Encounter for supervision of other normal pregnancy, unspecified trimester: Secondary | ICD-10-CM

## 2019-04-22 DIAGNOSIS — O4402 Placenta previa specified as without hemorrhage, second trimester: Secondary | ICD-10-CM

## 2019-04-22 NOTE — Progress Notes (Signed)
TELEHEALTH OBSTETRICS PRENATAL VIRTUAL VIDEO VISIT ENCOUNTER NOTE  Provider location: [facility] [place]   I connected with Anna HitchBrandi Levan on 04/22/19 at  1:15 PM EDT by WebEx Encounter at home and [employer] that I am speaking with the correct person using two identifiers.   I discussed the limitations, risks, security and privacy concerns of performing an evaluation and management service by telephone and the availability of in person appointments. I also discussed with the patient that there may be a patient responsible charge related to this service. The patient expressed understanding and agreed to proceed. Subjective:  Anna Jordan is a 28 y.o. [id number] at 6575w0d being seen today for ongoing prenatal care.  She is currently monitored for the following issues for this high-risk pregnancy and has Generalized anxiety disorder; History of vertebral compression fracture; Insulin controlled White classification A2 gestational diabetes mellitus (GDM); Hx of pre-eclampsia in prior pregnancy, currently pregnant; Supervision of other normal pregnancy, antepartum; Nausea and vomiting during pregnancy; Type 2 diabetes mellitus affecting pregnancy, antepartum; Late Entry to [employer]- March 2020- Social Distancing; Class 3 severe obesity due to excess calories with body mass index (BMI) of 50.0 to 59.9 in adult [facility](HCC); Lumbar facet joint pain; Spondylosis of lumbar spine; Sacroiliitis (HCC); Mild intermittent asthma with acute exacerbation; and History of shoulder dystocia in prior pregnancy, currently pregnant on their problem list.  Patient reports no complaints.  Contractions: Not present. Vag. Bleeding: None.  Movement: Present. Denies any leaking of fluid.   The following portions of the patient's history were reviewed and updated as appropriate: allergies, current medications, past family history, past medical history, past social history, past surgical history and problem  list.   Objective:   Vitals:   04/22/19 1248  BP: 114/68  Weight: 269 lb (122 kg)    Fetal Status:     Movement: Present     General:  Alert, oriented and cooperative. Patient is in no acute distress.  Respiratory: Normal respiratory effort, no problems with respiration noted  Mental Status: Normal mood and affect. Normal behavior. Normal judgment and thought content.  Rest of physical exam deferred due to type of encounter SUGARS: over the last week June1 116, 110, 98, 101 June 2, 114, 98, 116, 100 June 3 103, 111, 113, 91 June 4 96, 120, 117, 98 June 5 114, 110, 113, 99 June 6 121, 115, 111, 121 June 7 99, 117, 119, 117 June 8 113  Imaging: [place] Mfm Ob Follow Up  Result Date: 04/18/2019 ----------------------------------------------------------------------  OBSTETRICS REPORT                       (Signed Final 04/18/2019 05:16 pm) ---------------------------------------------------------------------- Patient Info  ID #:       [id number]                          D.O.B.:  04-13-1991 (28 yrs)  Name:       Anna Jordan                 Visit Date: 04/18/2019 04:11 pm ---------------------------------------------------------------------- Performed By  Performed By:     Sandi MealyJovancia Adrien        Ref. Address:     [address] [place]                    RDMS  [address]                                                             [place], [place]                                                             [id number]  Attending:        Noralee Spaceavi Shankar MD        [facility]  Referred By:      Allie BossierMYRA C Terrance Lanahan MD ---------------------------------------------------------------------- Orders   #  Description                          Code         Ordered By   1  US MFM OB FOLLOW UP                  [id number]     Lin LandsmanORENTHIAN                                                         BOOKER  ----------------------------------------------------------------------   #  Order #                    Accession #                 Episode #   1  [id number]                  [id number]                  [id number]  ---------------------------------------------------------------------- Indications   Encounter for other antenatal screening        Z36.2   follow-up (No genetic screening)   Obesity complicating pregnancy, second         O99.212   trimester (BMI 50)   Poor obstetric history: Previous preterm       O09.219   delivery, antepartum   Poor obstetric history: Previous               O09.299   preeclampsia / eclampsia/gestational HTN   Pre-existing diabetes, type 1, in pregnancy,   O24.012   second trimester (lantus, metformin)   Placenta previa specified as without           O44.02   hemorrhage, second trimester   [redacted] weeks gestation of pregnancy  Z3A.24  ---------------------------------------------------------------------- Vital Signs  Weight (lb): 271                               Height:        5'2"  BMI:         49.56 ---------------------------------------------------------------------- Fetal Evaluation  Num Of Fetuses:         1  Fetal Heart Rate(bpm):  142  Cardiac Activity:       Observed  Presentation:           Transverse, head to maternal right  Placenta:               Anterior previa  P. Cord Insertion:      Visualized  Amniotic Fluid  AFI FV:      Within normal limits  AFI Sum(cm)     %Tile       Largest Pocket(cm)  19.79           81          8.99  RUQ(cm)       RLQ(cm)       LUQ(cm)        LLQ(cm)  8.99          3.54          4.5            2.76 ---------------------------------------------------------------------- Biometry  BPD:      54.5  mm     G. Age:  22w 4d          2  %    CI:        67.84   %    70 - 86                                                          FL/HC:      20.4   %    18.7 - 20.9  HC:      211.7  mm     G. Age:  23w 2d          5  %    HC/AC:      1.12         1.05 - 1.21  AC:      188.5  mm     G. Age:  23w 5d         19  %    FL/BPD:     79.1   %    71 - 87  FL:       43.1  mm     G. Age:  24w 1d         27  %    FL/AC:      22.9   %    20 - 24  HUM:      38.1  mm     G. Age:  23w 3d         19  %  LV:        5.1  mm  Est. FW:     621  gm      1 lb 6 oz     36  % ---------------------------------------------------------------------- OB History  Gravidity:  5         Term:   1        Prem:   1        SAB:   2  Living:       2 ---------------------------------------------------------------------- Gestational Age  LMP:           24w 3d        Date:  10/29/18                 EDD:   08/05/19  U/S Today:     23w 3d                                        EDD:   08/12/19  Best:          24w 3d     Det. By:  LMP  (10/29/18)          EDD:   08/05/19 ---------------------------------------------------------------------- Anatomy  Cranium:               Appears normal         Aortic Arch:            Previously seen  Cavum:                 Appears normal         Ductal Arch:            Not well visualized  Ventricles:            Appears normal         Diaphragm:              Appears normal  Choroid Plexus:        Previously seen        Stomach:                Appears normal, left                                                                        sided  Cerebellum:            Previously seen        Abdomen:                Appears normal  Posterior Fossa:       Previously seen        Abdominal Wall:         Appears nml (cord                                                                        insert, abd wall)  Nuchal Fold:           Not applicable (>20    Cord Vessels:           Appears normal ([redacted]  wks GA)                                        vessel cord)  Face:                  Orbits and profile     Kidneys:                Appear normal                         previously seen  Lips:                  Previously seen        Bladder:                 Appears normal  Thoracic:              Appears normal         Spine:                  Ltd views no                                                                        intracranial signs of                                                                        NTD  Heart:                 Appears normal         Upper Extremities:      Appears normal                         (4CH, axis, and                         situs)  RVOT:                  Appears normal         Lower Extremities:      Appears normal  LVOT:                  Not well visualized  Other:  Parents do not wish to know sex of fetus. Lt heel and 5th digit          visualized. ---------------------------------------------------------------------- Cervix Uterus Adnexa  Cervix  Length:           5.38  cm. ---------------------------------------------------------------------- Impression  Pregestational diabetes.  Maternal obesity.  Patient returned for completion of fetal anatomy. Amniotic  fluid is normal and good fetal activity is seen. Fetal growth is  appropriate for gestational age. LVOT and ductal arch could  not be evaluated.  Placenta previa is seen. Patient does  not give history of  vaginal bleeding. ---------------------------------------------------------------------- Recommendations  -An appointment was made for her to return in 4 weeks for  fetal growth assessment.  -Transvaginal ultrasound to evaluate the placenta. ----------------------------------------------------------------------                  Noralee Space, MD Electronically Signed Final Report   04/18/2019 05:16 pm ----------------------------------------------------------------------   Assessment and Plan:  Pregnancy: [id number] at [redacted]w[redacted]d 1. Class 3 severe obesity due to excess calories with body mass index (BMI) of 50.0 to 59.9 in adult, unspecified whether serious comorbidity present (HCC)   2. Insulin controlled White classification A2 gestational diabetes mellitus (GDM) -  taking NPH 28 units q AM and 33 qhs, regular 26 qAM, 22 with lunch, 32 with supper - she will increase her NPH to 36, and because all her other values are fine, I have told her that she does not have to check after EVER meal (can skip one value per day)  3. Hx of pre-eclampsia in prior pregnancy, currently pregnant - on baby asa  4. Supervision of other normal pregnancy, antepartum - next MFM u/s next month  5. Placenta previa- pelvic rest  Preterm labor symptoms and general obstetric precautions including but not limited to vaginal bleeding, contractions, leaking of fluid and fetal movement were reviewed in detail with the patient. I discussed the assessment and treatment plan with the patient. The patient was provided an opportunity to ask questions and all were answered. The patient agreed with the plan and demonstrated an understanding of the instructions. The patient was advised to call back or seek an in-person office evaluation/go to [facility]U at [facility] for any urgent or concerning symptoms. Please refer to After Visit Summary for other counseling recommendations.   I provided 10 minutes of face-to-face time during this encounter.  No follow-ups on file.  Future Appointments  Date Time Provider Department Center  05/15/2019  3:45 PM WH-MFC NURSE WH-MFC MFC-US  05/15/2019  3:45 PM WH-MFC [place] 2 WH-MFCUS MFC-US    Allie Bossier, MD [facility] [employer], [facility]

## 2019-04-24 ENCOUNTER — Inpatient Hospital Stay (HOSPITAL_COMMUNITY)
Admission: AD | Admit: 2019-04-24 | Discharge: 2019-04-24 | Disposition: A | Discharge status: Home / Self Care | Payer: Medicaid Other | Attending: Obstetrics & Gynecology | Admitting: Obstetrics & Gynecology

## 2019-04-24 ENCOUNTER — Other Ambulatory Visit: Payer: Self-pay

## 2019-04-24 ENCOUNTER — Inpatient Hospital Stay (HOSPITAL_BASED_OUTPATIENT_CLINIC_OR_DEPARTMENT_OTHER): Payer: Medicaid Other

## 2019-04-24 ENCOUNTER — Telehealth: Payer: Self-pay | Admitting: *Deleted

## 2019-04-24 ENCOUNTER — Encounter (HOSPITAL_COMMUNITY): Payer: Self-pay

## 2019-04-24 DIAGNOSIS — Z87891 Personal history of nicotine dependence: Secondary | ICD-10-CM | POA: Diagnosis not present

## 2019-04-24 DIAGNOSIS — O24119 Pre-existing diabetes mellitus, type 2, in pregnancy, unspecified trimester: Secondary | ICD-10-CM

## 2019-04-24 DIAGNOSIS — O9989 Other specified diseases and conditions complicating pregnancy, childbirth and the puerperium: Secondary | ICD-10-CM | POA: Diagnosis not present

## 2019-04-24 DIAGNOSIS — O26892 Other specified pregnancy related conditions, second trimester: Secondary | ICD-10-CM | POA: Diagnosis not present

## 2019-04-24 DIAGNOSIS — O09292 Supervision of pregnancy with other poor reproductive or obstetric history, second trimester: Secondary | ICD-10-CM

## 2019-04-24 DIAGNOSIS — Z3686 Encounter for antenatal screening for cervical length: Secondary | ICD-10-CM

## 2019-04-24 DIAGNOSIS — O09212 Supervision of pregnancy with history of pre-term labor, second trimester: Secondary | ICD-10-CM

## 2019-04-24 DIAGNOSIS — O99212 Obesity complicating pregnancy, second trimester: Secondary | ICD-10-CM | POA: Diagnosis not present

## 2019-04-24 DIAGNOSIS — Z794 Long term (current) use of insulin: Secondary | ICD-10-CM | POA: Diagnosis not present

## 2019-04-24 DIAGNOSIS — O26899 Other specified pregnancy related conditions, unspecified trimester: Secondary | ICD-10-CM

## 2019-04-24 DIAGNOSIS — Z3A25 25 weeks gestation of pregnancy: Secondary | ICD-10-CM | POA: Insufficient documentation

## 2019-04-24 DIAGNOSIS — E119 Type 2 diabetes mellitus without complications: Secondary | ICD-10-CM | POA: Diagnosis not present

## 2019-04-24 DIAGNOSIS — R109 Unspecified abdominal pain: Secondary | ICD-10-CM | POA: Insufficient documentation

## 2019-04-24 DIAGNOSIS — O24112 Pre-existing diabetes mellitus, type 2, in pregnancy, second trimester: Secondary | ICD-10-CM | POA: Diagnosis not present

## 2019-04-24 DIAGNOSIS — O24012 Pre-existing diabetes mellitus, type 1, in pregnancy, second trimester: Secondary | ICD-10-CM

## 2019-04-24 LAB — URINALYSIS, ROUTINE W REFLEX MICROSCOPIC
Bilirubin Urine: NEGATIVE
Glucose, UA: >=500 mg/dL — AB
Hgb urine dipstick: NEGATIVE
Ketones, ur: 5 mg/dL — AB
Leukocytes,Ua: NEGATIVE
Nitrite: NEGATIVE
Protein, ur: NEGATIVE mg/dL
Specific Gravity, Urine: 1.016 (ref 1.005–1.030)
pH: 7 (ref 5.0–8.0)

## 2019-04-24 LAB — COMPREHENSIVE METABOLIC PANEL
ALT: 12 U/L (ref 0–44)
AST: 14 U/L — ABNORMAL LOW (ref 15–41)
Albumin: 2.8 g/dL — ABNORMAL LOW (ref 3.5–5.0)
Alkaline Phosphatase: 57 U/L (ref 38–126)
Anion gap: 11 (ref 5–15)
BUN: 5 mg/dL — ABNORMAL LOW (ref 6–20)
CO2: 20 mmol/L — ABNORMAL LOW (ref 22–32)
Calcium: 9.3 mg/dL (ref 8.9–10.3)
Chloride: 105 mmol/L (ref 98–111)
Creatinine, Ser: 0.55 mg/dL (ref 0.44–1.00)
GFR calc Af Amer: >60 mL/min (ref >60)
GFR calc non Af Amer: >60 mL/min (ref >60)
Glucose, Bld: 235 mg/dL — ABNORMAL HIGH (ref 70–99)
Potassium: 3.5 mmol/L (ref 3.5–5.1)
Sodium: 136 mmol/L (ref 135–145)
Total Bilirubin: 0.2 mg/dL — ABNORMAL LOW (ref 0.3–1.2)
Total Protein: 5.9 g/dL — ABNORMAL LOW (ref 6.5–8.1)

## 2019-04-24 LAB — CBC
HCT: 34.8 % — ABNORMAL LOW (ref 36.0–46.0)
Hemoglobin: 11.5 g/dL — ABNORMAL LOW (ref 12.0–15.0)
MCH: 29.6 pg (ref 26.0–34.0)
MCHC: 33 g/dL (ref 30.0–36.0)
MCV: 89.7 fL (ref 80.0–100.0)
Platelets: 267 10*3/uL (ref 150–400)
RBC: 3.88 MIL/uL (ref 3.87–5.11)
RDW: 14 % (ref 11.5–15.5)
WBC: 10.6 10*3/uL — ABNORMAL HIGH (ref 4.0–10.5)
nRBC: 0 % (ref 0.0–0.2)

## 2019-04-24 LAB — GLUCOSE, CAPILLARY
Glucose-Capillary: 225 mg/dL — ABNORMAL HIGH (ref 70–99)
Glucose-Capillary: 202 mg/dL — ABNORMAL HIGH (ref 70–99)

## 2019-04-24 LAB — FETAL FIBRONECTIN: Fetal Fibronectin: NEGATIVE

## 2019-04-24 MED ORDER — INSULIN REGULAR HUMAN 100 UNIT/ML IJ SOLN: INTRAMUSCULAR | 11 refills | Status: DC

## 2019-04-24 MED ORDER — INSULIN ASPART 100 UNIT/ML ~~LOC~~ SOLN
32.0000 [IU] | Freq: Once | SUBCUTANEOUS | Status: AC
  Administered 2019-04-24: 32 [IU] via SUBCUTANEOUS

## 2019-04-24 MED ORDER — LACTATED RINGERS IV BOLUS
1000.0000 mL | Freq: Once | INTRAVENOUS | Status: AC
  Administered 2019-04-24: 18:00:00 1000 mL via INTRAVENOUS

## 2019-04-24 MED ORDER — NIFEDIPINE 10 MG PO CAPS
10.0000 mg | ORAL_CAPSULE | ORAL | Status: DC | PRN
  Administered 2019-04-24 (×2): 10 mg via ORAL
  Filled 2019-04-24 (×2): qty 1

## 2019-04-24 MED ORDER — INSULIN NPH (HUMAN) (ISOPHANE) 100 UNIT/ML ~~LOC~~ SUSP: SUBCUTANEOUS | 6 refills | Status: DC

## 2019-04-24 NOTE — MAU Provider Note (Signed)
Chief Complaint:  Abdominal Pain and Vaginal Bleeding   First Provider Initiated Contact with Patient 04/24/19 1723     HPI: Anna Jordan is a 28 y.o. [id number] at 34w2dwho presents to maternity admissions reporting abdominal cramping. Symptoms started last night. States the cramping feels consistent at times. Had some mucoid discharge last night followed by small amount of spotting. Denies n/v/d, dysuria, LOF. No recent intercourse. Has placenta previa.  Is T2DM on insulin. Has not missed any doses. Last ate around 2 pm & vomited soon after.   Location: abdomen Quality: cramping Severity: 7/10 in pain scale Duration: <1 day  Timing: intermittent Modifying factors: none Associated signs and symptoms: vaginal discharge  Past Medical History:  Diagnosis Date  . Anxiety   . Candida vaginitis 06/18/2017  . Depression   . Gestational diabetes   . Hypertension   . Obesity   . Schizophrenia ([place]ary    OB History  Gravida Para Term Preterm AB Living  '5 2 1 1 2 2  ' SAB TAB Ectopic Multiple Live Births  2       2    # Outcome Date GA Lbr Len/2nd Weight Sex Delivery Anes PTL Lv  5 Current           4 Preterm 09/27/17 [id number] g M Vag-Spont EPI N LIV  3 Term 08/08/11   4281 g M Vag-Spont   LIV  2 SAB           1 SAB            Past Surgical History:  Procedure Laterality Date  . GALLBLADDER SURGERY  08/2018  . WISDOM TOOTH EXTRACTION     Family History  Problem Relation Age of Onset  . Depression Mother   . Diabetes Mother   . Heart disease Mother   . Stroke Father   . Hypertension Father   . Breast cancer Maternal Aunt        4 mat aunts with breast cancer  . Diabetes Brother   . Breast cancer Maternal Grandmother   . Breast cancer Paternal Grandmother   . Breast cancer Paternal Grandfather    Social History   Tobacco Use  . Smoking status: Former Smoker    Packs/day: 0.50    Years: 15.00    Pack years: 7.50    Types: Cigarettes    Last attempt to quit: 03/31/2017     Years since quitting: 2.0  . Smokeless tobacco: Never Used  . Tobacco comment: Quit previously x 1 year, Prior to quittting this time, smoked 0.5-1ppd  Substance Use Topics  . Alcohol use: No  . Drug use: No   Allergies  Allergen Reactions  . Hydrocodone Hives  . Tramadol Hives  . Penicillin G Rash and Other (See Comments)    Has patient had a PCN reaction causing immediate rash, facial/tongue/throat swelling, SOB or lightheadedness with hypotension: unknown Has patient had a PCN reaction causing severe rash involving mucus membranes or skin necrosis: unknown Has patient had a PCN reaction that required hospitalization: unknown Has patient had a PCN reaction occurring within the last 10 years: no If all of the above answers are "NO", then may proceed with Cephalosporin use.    Facility-Administered Medications Prior to Admission  Medication Dose Route Frequency Provider Last Rate Last Dose  . insulin starter kit- syringes (English) 1 kit  1 kit Other Once DoEmily FilbertMD       Medications Prior to Admission  Medication Sig Dispense Refill Last Dose  . Accu-Chek FastClix Lancets MISC 1 Device by Percutaneous route 4 (four) times daily. 100 each 12 Taking  . albuterol (PROVENTIL HFA;VENTOLIN HFA) 108 (90 Base) MCG/ACT inhaler Inhale 2 puffs into the lungs every 6 (six) hours as needed for wheezing or shortness of breath.   Taking  . aspirin EC 81 MG tablet Take 1 tablet (81 mg total) by mouth daily. Take after 12 weeks for prevention of preeclampsia later in pregnancy 300 tablet 0 Taking  . Blood Glucose Monitoring Suppl (ACCU-CHEK AVIVA) device Use as instructed 1 each 0 Taking  . glucose blood test strip Use to check CBG's QID 100 each 12 Taking  . Insulin Syringes, Disposable, U-100 0.3 ML MISC 1 each by Does not apply route 4 (four) times daily as needed. 100 each 12 Taking  . loratadine (CLARITIN) 10 MG tablet Take 10 mg by mouth daily.   Taking  . Prenatal Vit-Fe Fumarate-FA  (MULTIVITAMIN-PRENATAL) 27-0.8 MG TABS tablet Take 1 tablet by mouth daily at 12 noon. 30 tablet 10 Taking  . tiZANidine (ZANAFLEX) 4 MG tablet Take 4 mg by mouth every 8 (eight) hours as needed for muscle spasms.  1 Taking  . [DISCONTINUED] insulin NPH Human (HUMULIN N,NOVOLIN N) 100 UNIT/ML injection Inject 16 units with breakfast and 6 units at bedtime (Patient taking differently: Inject 28 units with breakfast and 33 units at bedtime) 10 mL 6 Taking  . [DISCONTINUED] insulin regular (NOVOLIN R,HUMULIN R) 100 units/mL injection Inject 10 units with breakfast and 6 units with supper (Patient taking differently: Inject 26 units with breakfast 22 before lunch 32 units with supper) 10 mL 11 Taking    I have reviewed patient's Past Medical Hx, Surgical Hx, Family Hx, Social Hx, medications and allergies.   ROS:  Review of Systems  Constitutional: Negative.   Gastrointestinal: Positive for abdominal pain and vomiting (vomited once after eating lunch). Negative for constipation, diarrhea and nausea.  Genitourinary: Positive for vaginal discharge. Negative for dysuria and vaginal bleeding.    Physical Exam   Patient Vitals for the past 24 hrs:  BP Temp Temp src Pulse Resp SpO2 Height Weight  04/24/19 1947 125/66 - - (!) 106 - - - -  04/24/19 1648 124/74 98.3 F (36.8 C) Oral (!) 108 20 100 % '5\' 3"'  (1.6 m) 125.7 kg    Constitutional: Well-developed, well-nourished female in no acute distress.  Cardiovascular: normal rate & rhythm, no murmur Respiratory: normal effort, lung sounds clear throughout GI: Abd soft, non-tender, gravid appropriate for gestational age. Pos BS x 4 MS: Extremities nontender, no edema, normal ROM Neurologic: Alert and oriented x 4.  GU:  Pelvic: small amount of mucoid discharge. No blood. Cervix visually closed  NST:  Baseline: 150 bpm, Variability: Good {> 6 bpm), Accelerations: Non-reactive but appropriate for gestational age and Decelerations: Absent    Labs: Results for orders placed or performed during the hospital encounter of 04/24/19 (from the past 24 hour(s))  Urinalysis, Routine w reflex microscopic     Status: Abnormal   Collection Time: 04/24/19  4:47 PM  Result Value Ref Range   Color, Urine YELLOW YELLOW   APPearance CLEAR CLEAR   Specific Gravity, Urine 1.016 1.005 - 1.030   pH 7.0 5.0 - 8.0   Glucose, UA >=500 (A) NEGATIVE mg/dL   Hgb urine dipstick NEGATIVE NEGATIVE   Bilirubin Urine NEGATIVE NEGATIVE   Ketones, ur 5 (A) NEGATIVE mg/dL   Protein, ur NEGATIVE NEGATIVE  mg/dL   Nitrite NEGATIVE NEGATIVE   Leukocytes,Ua NEGATIVE NEGATIVE   RBC / HPF 0-5 0 - 5 RBC/hpf   WBC, UA 0-5 0 - 5 WBC/hpf   Bacteria, UA RARE (A) NONE SEEN   Squamous Epithelial / LPF 0-5 0 - 5   Mucus PRESENT   Fetal fibronectin     Status: None   Collection Time: 04/24/19  5:39 PM  Result Value Ref Range   Fetal Fibronectin NEGATIVE NEGATIVE  Glucose, capillary     Status: Abnormal   Collection Time: 04/24/19  5:43 PM  Result Value Ref Range   Glucose-Capillary 225 (H) 70 - 99 mg/dL  CBC     Status: Abnormal   Collection Time: 04/24/19  5:55 PM  Result Value Ref Range   WBC 10.6 (H) 4.0 - 10.5 K/uL   RBC 3.88 3.87 - 5.11 MIL/uL   Hemoglobin 11.5 (L) 12.0 - 15.0 g/dL   HCT 34.8 (L) 36.0 - 46.0 %   MCV 89.7 80.0 - 100.0 fL   MCH 29.6 26.0 - 34.0 pg   MCHC 33.0 30.0 - 36.0 g/dL   RDW 14.0 11.5 - 15.5 %   Platelets 267 150 - 400 K/uL   nRBC 0.0 0.0 - 0.2 %  Comprehensive metabolic panel     Status: Abnormal   Collection Time: 04/24/19  5:55 PM  Result Value Ref Range   Sodium 136 135 - 145 mmol/L   Potassium 3.5 3.5 - 5.1 mmol/L   Chloride 105 98 - 111 mmol/L   CO2 20 (L) 22 - 32 mmol/L   Glucose, Bld 235 (H) 70 - 99 mg/dL   BUN 5 (L) 6 - 20 mg/dL   Creatinine, Ser 0.55 0.44 - 1.00 mg/dL   Calcium 9.3 8.9 - 10.3 mg/dL   Total Protein 5.9 (L) 6.5 - 8.1 g/dL   Albumin 2.8 (L) 3.5 - 5.0 g/dL   AST 14 (L) 15 - 41 U/L   ALT 12 0 - 44  U/L   Alkaline Phosphatase 57 38 - 126 U/L   Total Bilirubin 0.2 (L) 0.3 - 1.2 mg/dL   GFR calc non Af Amer >60 >60 mL/min   GFR calc Af Amer >60 >60 mL/min   Anion gap 11 5 - 15  Glucose, capillary     Status: Abnormal   Collection Time: 04/24/19  7:10 PM  Result Value Ref Range   Glucose-Capillary 202 (H) 70 - 99 mg/dL    Imaging:  No results found.  MAU Course: Orders Placed This Encounter  Procedures  . [place] MFM OB TRANSVAGINAL  . Urinalysis, Routine w reflex microscopic  . Fetal fibronectin  . Glucose, capillary  . CBC  . Comprehensive metabolic panel  . Glucose, capillary  . Diet Carb Modified Fluid consistency: Thin; Room service appropriate? Yes  . Discharge patient   Meds ordered this encounter  Medications  . lactated ringers bolus 1,000 mL  . NIFEdipine (PROCARDIA) capsule 10 mg  . insulin aspart (novoLOG) injection 32 Units  . insulin NPH Human (NOVOLIN N) 100 UNIT/ML injection    Sig: Inject 28 units with breakfast and 33 units at bedtime    Dispense:  10 mL    Refill:  6    Order Specific Question:   Supervising Provider    Answer:   Verita Schneiders A [[id number]]  . insulin regular (NOVOLIN R) 100 units/mL injection    Sig: Inject 26 units with breakfast 22 before lunch 32 units with supper  Dispense:  10 mL    Refill:  11    Order Specific Question:   Supervising Provider    Answer:   Verita Schneiders A [[id number]]    MDM: Pt appears uncomfortable. Difficult to assess with toco d/t body habitus.  Cervix visually closed. FFN negative IV fluids & procardia ordered Ultrasound- no previa. Cervical length 4.9 cm  >500 glucose in urine. CBG 225 C/w Dr. Elly Modena. Will feed patient & given evening meal dose of insulin.  Regular insulin 32 units given to patient.  CBG down to 202 after insulin. Dr. Elly Modena updated. Pt ok to go home, will take bedtime insulin as scheduled. Pt has f/u in office on 6/15.  Assessment: 1. Abdominal cramping affecting pregnancy   2.  Type 2 diabetes mellitus affecting pregnancy, antepartum     Plan: Discharge home in stable condition.  Discussed reasons to return to MAU  Follow-up Information    [facility] Follow up.   Specialty:  Obstetrics and Gynecology Why:  return for worsening symptoms Contact information: [address] [id number] [employer] [phone]          Allergies as of 04/24/2019      Reactions   Hydrocodone Hives   Tramadol Hives   Penicillin G Rash, Other (See Comments)   Has patient had a PCN reaction causing immediate rash, facial/tongue/throat swelling, SOB or lightheadedness with hypotension: unknown Has patient had a PCN reaction causing severe rash involving mucus membranes or skin necrosis: unknown Has patient had a PCN reaction that required hospitalization: unknown Has patient had a PCN reaction occurring within the last 10 years: no If all of the above answers are "NO", then may proceed with Cephalosporin use.      Medication List    TAKE these medications   Accu-Chek Aviva device Use as instructed   Accu-Chek FastClix Lancets Misc 1 Device by Percutaneous route 4 (four) times daily.   albuterol 108 (90 Base) MCG/ACT inhaler Commonly known as:  VENTOLIN HFA Inhale 2 puffs into the lungs every 6 (six) hours as needed for wheezing or shortness of breath.   aspirin EC 81 MG tablet Take 1 tablet (81 mg total) by mouth daily. Take after 12 weeks for prevention of preeclampsia later in pregnancy   glucose blood test strip Use to check CBG's QID   insulin NPH Human 100 UNIT/ML injection Commonly known as:  NOVOLIN N Inject 28 units with breakfast and 33 units at bedtime   insulin regular 100 units/mL injection Commonly known as:  NOVOLIN R Inject 26 units with breakfast 22 before lunch 32 units with supper   Insulin Syringes (Disposable) U-100 0.3 ML Misc 1 each by Does not apply route 4 (four) times daily as  needed.   loratadine 10 MG tablet Commonly known as:  CLARITIN Take 10 mg by mouth daily.   multivitamin-prenatal 27-0.8 MG Tabs tablet Take 1 tablet by mouth daily at 12 noon.   tiZANidine 4 MG tablet Commonly known as:  ZANAFLEX Take 4 mg by mouth every 8 (eight) hours as needed for muscle spasms.       Jorje Guild, NP 04/24/2019 7:58 PM

## 2019-04-24 NOTE — Telephone Encounter (Signed)
Patient stated that she she is having sever cramping when walking, only feels better when she lays down on her side. It is getting worse with back pain and pressure as well. Advised to go to MAU. No provider is in the office this afternoon to see her. Patient agreed to treatment plan.

## 2019-04-24 NOTE — MAU Note (Addendum)
Pt having abdominal cramping rates 7/10. Also having some back pain. Got worst last night and today. Has complete placental previa and lost her mucous plug last night and has had some spotting.

## 2019-04-24 NOTE — Discharge Instructions (Signed)
Preterm Labor and Birth Information  The normal length of a pregnancy is 39-41 weeks. Preterm labor is when labor starts before 37 completed weeks of pregnancy. What are the risk factors for preterm labor? Preterm labor is more likely to occur in women who:  Have certain infections during pregnancy such as a bladder infection, sexually transmitted infection, or infection inside the uterus (chorioamnionitis).  Have a shorter-than-normal cervix.  Have gone into preterm labor before.  Have had surgery on their cervix.  Are younger than age 53 or older than age 63.  Are African American.  Are pregnant with twins or multiple babies (multiple gestation).  Take street drugs or smoke while pregnant.  Do not gain enough weight while pregnant.  Became pregnant shortly after having been pregnant. What are the symptoms of preterm labor? Symptoms of preterm labor include:  Cramps similar to those that can happen during a menstrual period. The cramps may happen with diarrhea.  Pain in the abdomen or lower back.  Regular uterine contractions that may feel like tightening of the abdomen.  A feeling of increased pressure in the pelvis.  Increased watery or bloody mucus discharge from the vagina.  Water breaking (ruptured amniotic sac). Why is it important to recognize signs of preterm labor? It is important to recognize signs of preterm labor because babies who are born prematurely may not be fully developed. This can put them at an increased risk for:  Long-term (chronic) heart and lung problems.  Difficulty immediately after birth with regulating body systems, including blood sugar, body temperature, heart rate, and breathing rate.  Bleeding in the brain.  Cerebral palsy.  Learning difficulties.  Death. These risks are highest for babies who are born before 49 weeks of pregnancy. How is preterm labor treated? Treatment depends on the length of your pregnancy, your condition,  and the health of your baby. It may involve:  Having a stitch (suture) placed in your cervix to prevent your cervix from opening too early (cerclage).  Taking or being given medicines, such as: ? Hormone medicines. These may be given early in pregnancy to help support the pregnancy. ? Medicine to stop contractions. ? Medicines to help mature the babys lungs. These may be prescribed if the risk of delivery is high. ? Medicines to prevent your baby from developing cerebral palsy. If the labor happens before 34 weeks of pregnancy, you may need to stay in the hospital. What should I do if I think I am in preterm labor? If you think that you are going into preterm labor, call your health care provider right away. How can I prevent preterm labor in future pregnancies? To increase your chance of having a full-term pregnancy:  Do not use any tobacco products, such as cigarettes, chewing tobacco, and e-cigarettes. If you need help quitting, ask your health care provider.  Do not use street drugs or medicines that have not been prescribed to you during your pregnancy.  Talk with your health care provider before taking any herbal supplements, even if you have been taking them regularly.  Make sure you gain a healthy amount of weight during your pregnancy.  Watch for infection. If you think that you might have an infection, get it checked right away.  Make sure to tell your health care provider if you have gone into preterm labor before. This information is not intended to replace advice given to you by your health care provider. Make sure you discuss any questions you have with your  health care provider. Document Released: 01/21/2004 Document Revised: 04/12/2016 Document Reviewed: 03/23/2016 Elsevier Interactive Patient Education  2019 [employer].      Type 1 or Type 2 Diabetes Mellitus During Pregnancy, Self Care Caring for yourself during your pregnancy when you have type 1 diabetes  (type 1 diabetes mellitus) or type 2 diabetes (type 2 diabetes mellitus) means keeping your blood sugar (glucose) under control with a balance of:  Nutrition.  Exercise.  Lifestyle changes.  Insulin or medicines, if necessary.  Support from your team of health care providers and others. The following information explains what you need to know to manage your diabetes at home during your pregnancy. What are the risks? If diabetes is treated, it is unlikely to cause problems. If it is not controlled with treatment, it may cause problems during labor and delivery, and some of those problems can be harmful to the unborn baby (fetus) and the mother. Uncontrolled diabetes may also cause the newborn baby to have breathing problems and low blood glucose. Having diabetes can put you at risk for other long-term (chronic) conditions, such as heart disease and kidney disease. Your health care provider may prescribe medicines to help prevent complications from diabetes. These medicines may include:  Aspirin.  Medicine to lower cholesterol.  Medicine to control blood pressure. How to monitor blood glucose   Check your blood glucose every day, as often as told by your health care provider.  Contact your health care provider if your blood glucose is above your target for 2 tests in a row.  Have your A1c (hemoglobin A1c) level checked at least two times a year, or as often as told by your health care provider. Your health care provider will set personal treatment goals for you. Generally, the goal of treatment is to maintain the following blood glucose levels during pregnancy:  After not eating (after fasting) for 8 hours: at or below 95 mg/dL (5.3 mmol/L).  After meals (postprandial): ? One hour after a meal: at or below 140 mg/dL (7.8 mmol/L). ? Two hours after a meal: at or below 120 mg/dL (6.7 mmol/L).  A1c level: 6-6.5% How to manage hyperglycemia and hypoglycemia Hyperglycemia  symptoms Hyperglycemia, also called high blood glucose, occurs when blood glucose is too high. Make sure you know the early signs of hyperglycemia, such as:  Increased thirst.  Hunger.  Feeling very tired.  Needing to urinate more often than usual.  Blurry vision. Hypoglycemia symptoms Hypoglycemia, also called low blood glucose, occurs with a blood glucose level at or below 70 mg/dL (3.9 mmol/L). The risk for hypoglycemia increases during or after exercise, during sleep, during illness, and when skipping meals or fasting. It is important to know the symptoms of hypoglycemia and treat it right away. Always have a 15-gram rapid-acting carbohydrate snack with you to treat low blood glucose. Family members and close friends should also know the symptoms and should understand how to treat hypoglycemia, in case you are not able to treat yourself. Symptoms may include:  Hunger.  Anxiety.  Sweating and feeling clammy.  Confusion.  Dizziness or feeling light-headed.  Sleepiness.  Nausea.  Increased heart rate.  Headache.  Blurry vision.  Irritability.  Tingling or numbness around the mouth, lips, or tongue.  A change in coordination.  Restless sleep.  Fainting.  Seizure. Treating hypoglycemia If you are alert and able to swallow safely, follow the 15:15 rule:  Take 15 grams of a rapid-acting carbohydrate. Rapid-acting options include: ? 1 tube of  glucose gel. ? 3 glucose pills. ? 6-8 pieces of hard candy. ? 4 oz (120 mL) of fruit juice . ? 4 oz (120 mL) of regular (not diet) soda.  Check your blood glucose 15 minutes after you take the carbohydrate.  If the repeat blood glucose level is still at or below 70 mg/dL (3.9 mmol/L), take 15 grams of a carbohydrate again.  If your blood glucose level does not increase above 70 mg/dL (3.9 mmol/L) after 3 tries, seek emergency medical care.  After your blood glucose level returns to normal, eat a meal or a snack within 1  hour. Treating severe hypoglycemia Severe hypoglycemia is when your blood glucose level is at or below 54 mg/dL (3 mmol/L). Severe hypoglycemia is an emergency. Do not wait to see if the symptoms will go away. Get medical help right away. Call your local emergency services (911 in the [place] If you have severe hypoglycemia and you cannot eat or drink, you may need an injection of glucagon. A family member or close friend should learn how to check your blood glucose and how to give you a glucagon injection. Ask your health care provider if you need to have an emergency glucagon injection kit available. Severe hypoglycemia may need to be treated in a hospital. The treatment may include getting glucose through an IV. You may also need treatment for the cause of your hypoglycemia. Follow these instructions at home: Take diabetes medicines as told  If your health care provider prescribed insulin or diabetes medicines, take them every day.  Do not run out of insulin or other diabetes medicines that you take. Plan ahead so you always have these available.  If you use insulin, adjust your dosage based on how physically active you are and what foods you eat. Your health care provider will tell you how to adjust your dosage.  Your health care provider may recommend that you take one low-dose aspirin (81 mg) each day to help prevent high blood pressure during pregnancy (preeclampsia or eclampsia). You may be at risk for preeclampsia or eclampsia if: ? You had any of the following during a previous pregnancy:  Preeclampsia or eclampsia.  A fetal growth rate that was slower than normal.  An early (preterm) birth.  Separation of the placenta from the uterus (placental abruption).  Fetal loss. ? You are pregnant with more than one baby. ? You have other medical conditions, such as high blood pressure or an autoimmune disease. Make healthy food choices  The things that you eat and drink affect your  blood glucose and your insulin dosage. Making good choices helps to control your diabetes and prevent other health problems. A healthy meal plan includes eating lean proteins, complex carbohydrates, fresh fruits and vegetables, low-fat dairy products, and healthy fats. Make an appointment to see a diet and nutrition specialist (registered dietitian) to help you create an eating plan that is right for you. Make sure that you:  Follow instructions from your health care provider about eating or drinking restrictions.  Drink enough fluid to keep your urine pale yellow.  Eat healthy snacks between nutritious meals.  Keep a record of the carbohydrates that you eat. Do this by reading food labels and learning the standard serving sizes of foods.  Follow your sick day plan whenever you cannot eat or drink as usual. Make this plan in advance with your health care provider.  Stay active  Do at least 30 minutes of physical activity a day, or  as much physical activity as your health care provider recommends during your pregnancy.  If you start a new exercise or activity, work with your health care provider to adjust your insulin, medicines, or food intake as needed. Make healthy lifestyle choices  Do not drink alcohol.  Do not use any tobacco products, such as cigarettes, chewing tobacco, and e-cigarettes. If you need help quitting, ask your health care provider.  Learn to manage stress. If you need help with this, ask your health care provider. Care for your body  Keep your immunizations up to date.  Schedule an eye exam during your first trimester of your pregnancy, or as told by your health care provider.  Check your skin and feet every day for cuts, bruises, redness, blisters, or sores. Schedule a foot exam with your health care provider once every year.  Brush your teeth and gums two times a day, and floss one or more times a day. Visit your dentist one or more times every 6  months.  Maintain a healthy weight during your pregnancy. General instructions  Take over-the-counter and prescription medicines only as told by your health care provider.  Talk with your health care provider about your risk for high blood pressure during pregnancy (preeclampsia or eclampsia).  Share your diabetes management plan with people in your workplace, school, and household.  Check your urine ketones when you are ill and as told by your health care provider.  Carry a medical alert card or wear medical alert jewelry.  Keep all follow-up visits during your pregnancy (prenatal) and after delivery (postnatal) as told by your health care provider. This is important. Questions to ask your health care provider  Do I need to meet with a diabetes educator?  Where can I find a support group for people with diabetes? Where to find more information For more information about diabetes, visit:  American Diabetes Association (ADA): www.diabetes.org  American Association of Diabetes Educators (AADE): www.diabeteseducator.org Summary  Caring for yourself when you have type 1 or type 2 diabetes means keeping your blood sugar (glucose) under control. You can do that with a balance of insulin and other medicines, nutrition, exercise, and lifestyle changes.  Check your blood glucose every day, as often as told by your health care provider.  If your health care provider prescribed insulin or diabetes medicines, take them every day.  Keep all follow-up visits during your pregnancy (prenatal) and after delivery (postnatal) as told by your health care provider. This is important. This information is not intended to replace advice given to you by your health care provider. Make sure you discuss any questions you have with your health care provider. Document Released: 02/22/2016 Document Revised: 06/20/2017 Document Reviewed: 12/04/2015 Elsevier Interactive Patient Education  2019 [employer].

## 2019-04-29 ENCOUNTER — Other Ambulatory Visit: Payer: Self-pay

## 2019-04-29 ENCOUNTER — Ambulatory Visit (INDEPENDENT_AMBULATORY_CARE_PROVIDER_SITE_OTHER): Payer: Medicaid Other | Admitting: Obstetrics & Gynecology

## 2019-04-29 VITALS — BP 117/64 | HR 93 | Wt 277.0 lb

## 2019-04-29 DIAGNOSIS — Z3A26 26 weeks gestation of pregnancy: Secondary | ICD-10-CM

## 2019-04-29 DIAGNOSIS — O24414 Gestational diabetes mellitus in pregnancy, insulin controlled: Secondary | ICD-10-CM

## 2019-04-29 DIAGNOSIS — O09299 Supervision of pregnancy with other poor reproductive or obstetric history, unspecified trimester: Secondary | ICD-10-CM

## 2019-04-29 DIAGNOSIS — Z3480 Encounter for supervision of other normal pregnancy, unspecified trimester: Secondary | ICD-10-CM

## 2019-04-29 DIAGNOSIS — O09292 Supervision of pregnancy with other poor reproductive or obstetric history, second trimester: Secondary | ICD-10-CM

## 2019-04-29 MED ORDER — OMEPRAZOLE 20 MG PO CPDR: 20.0000 mg | DELAYED_RELEASE_CAPSULE | Freq: Every day | ORAL | 4 refills | Status: DC

## 2019-04-29 NOTE — Progress Notes (Signed)
TELEHEALTH OBSTETRICS PRENATAL VIRTUAL VIDEO VISIT ENCOUNTER NOTE  Provider location: [facility] [place]   I connected with Anna Jordan on 04/29/19 at  2:30 PM EDT by MyChart Video Encounter at home and verified that I am speaking with the correct person using two identifiers.   I discussed the limitations, risks, security and privacy concerns of performing an evaluation and management service by telephone and the availability of in person appointments. I also discussed with the patient that there may be a patient responsible charge related to this service. The patient expressed understanding and agreed to proceed. Subjective:  Anna Jordan is a 28 y.o. [id number] at [redacted]w[redacted]d being seen today for ongoing prenatal care.  She is currently monitored for the following issues for this high-risk pregnancy and has Generalized anxiety disorder; History of vertebral compression fracture; Insulin controlled White classification A2 gestational diabetes mellitus (GDM); Hx of pre-eclampsia in prior pregnancy, currently pregnant; Supervision of other normal pregnancy, antepartum; Nausea and vomiting during pregnancy; Type 2 diabetes mellitus affecting pregnancy, antepartum; Late Entry to [facility]cripts- March 2020- Social Distancing; Class 3 severe obesity due to excess calories with body mass index (BMI) of 50.0 to 59.9 in adult [facility]); Lumbar facet joint pain; Spondylosis of lumbar spine; Sacroiliitis (HCC); Mild intermittent asthma with acute exacerbation; and History of shoulder dystocia in prior pregnancy, currently pregnant on their problem list.  Patient reports heartburn.  Contractions: Irritability. Vag. Bleeding: None.  Movement: Present. Denies any leaking of fluid.   The following portions of the patient's history were reviewed and updated as appropriate: allergies, current medications, past family history, past medical history, past social history, past surgical history and  problem list.   Objective:   Vitals:   04/29/19 1432  BP: 117/64  Pulse: 93    Fetal Status:     Movement: Present     General:  Alert, oriented and cooperative. Patient is in no acute distress.  Respiratory: Normal respiratory effort, no problems with respiration noted  Mental Status: Normal mood and affect. Normal behavior. Normal judgment and thought content.  Rest of physical exam deferred due to type of encounter  Imaging: [place] Mfm Ob Transvaginal  Result Date: 04/25/2019 ----------------------------------------------------------------------  OBSTETRICS REPORT                       (Signed Final 04/25/2019 08:29 am) ---------------------------------------------------------------------- Patient Info  ID #:       [id number]                          D.O.B.:  [birth year] (28 yrs)  Name:       Anna Jordan                 Visit Date: 04/24/2019 05:51 pm ---------------------------------------------------------------------- Performed By  Performed By:     Tomma Lightning             Ref. Address:     [address]                    [facility]                                                             [address]  [place], [place]                                                             [address]  Attending:        Noralee Space MD        [facility]  Referred By:      Allie Bossier MD ---------------------------------------------------------------------- Orders   #  Description                          Code         Ordered By   1  [place] MFM OB TRANSVAGINAL               [id number]      Judeth Horn  ----------------------------------------------------------------------   #  Order #                    Accession #                 Episode #   1  [id number]                  [id number]                  [id number]   ---------------------------------------------------------------------- Indications   Encounter for cervical length                  Z36.86   Abdominal pain in pregnancy                    O99.89   [redacted] weeks gestation of pregnancy                Z3A.25   Obesity complicating pregnancy, second         O99.212   trimester (BMI 50)   Poor obstetric history: Previous preterm       O09.219   delivery, antepartum   Poor obstetric history: Previous               O09.299   preeclampsia / eclampsia/gestational HTN   Pre-existing diabetes, type 1, in pregnancy,   O24.012   second trimester (lantus, metformin)  ---------------------------------------------------------------------- Vital Signs  Weight (lb): 277                               Height:        5'2"  BMI:         50.66 ---------------------------------------------------------------------- Fetal Evaluation  Num Of Fetuses:         1  Fetal Heart Rate(bpm):  152  Cardiac Activity:       Observed  Presentation:           Breech  Placenta:  Anterior, NO PREVIA SEEN  Amniotic Fluid  AFI FV:      Within normal limits                              Largest Pocket(cm)                              4.81 ---------------------------------------------------------------------- OB History  Gravidity:    5         Term:   1        Prem:   1        SAB:   2  Living:       2 ---------------------------------------------------------------------- Gestational Age  LMP:           25w 2d        Date:  10/29/18                 EDD:   08/05/19  Best:          Alcus Dad 2d     Det. By:  LMP  (10/29/18)          EDD:   08/05/19 ---------------------------------------------------------------------- Cervix Uterus Adnexa  Cervix  Length:            4.9  cm.  Measured transvaginally. ---------------------------------------------------------------------- Impression  Patient was evaluated in the MAU for c/o abdominal cramping  and vaginal spotting. On previous ultrasound, placenta previa  was  suspected.  Amniotic fluid is normal and good fetal activity is seen. On  transvaginal scan, the cervix measures 4.9 cm, which is  normal. No evidence of placenta previa. ----------------------------------------------------------------------                  Noralee Space, MD Electronically Signed Final Report   04/25/2019 08:29 am ----------------------------------------------------------------------  [place] Mfm Ob Follow Up  Result Date: 04/18/2019 ----------------------------------------------------------------------  OBSTETRICS REPORT                       (Signed Final 04/18/2019 05:16 pm) ---------------------------------------------------------------------- Patient Info  ID #:       [id number]                          D.O.B.:  1991/10/05 (28 yrs)  Name:       CHANTA BAUERS                 Visit Date: 04/18/2019 04:11 pm ---------------------------------------------------------------------- Performed By  Performed By:     Sandi Mealy        Ref. Address:     [address]  [place], [place]                                                             [id number]  Attending:        Noralee Spaceavi Shankar MD        [facility]  Referred By:      Allie BossierMYRA C Yaniv Lage MD ---------------------------------------------------------------------- Orders   #  Description                          Code         Ordered By   1  US MFM OB FOLLOW UP                  [id number]     Lin LandsmanORENTHIAN                                                        BOOKER  ----------------------------------------------------------------------   #  Order #                    Accession #                 Episode #   1  [id number]                  [id number]   ---------------------------------------------------------------------- Indications   Encounter for other antenatal screening        Z36.2   follow-up (No genetic screening)   Obesity complicating pregnancy, second         O99.212   trimester (BMI 50)   Poor obstetric history: Previous preterm       O09.219   delivery, antepartum   Poor obstetric history: Previous               O09.299   preeclampsia / eclampsia/gestational HTN   Pre-existing diabetes, type 1, in pregnancy,   O24.012   second trimester (lantus, metformin)   Placenta previa specified as without           O44.02   hemorrhage, second trimester   [redacted] weeks gestation of pregnancy                Z3A.24  ---------------------------------------------------------------------- Vital Signs  Weight (lb): 271                               Height:        5'2"  BMI:         49.56 ---------------------------------------------------------------------- Fetal  Evaluation  Num Of Fetuses:         1  Fetal Heart Rate(bpm):  142  Cardiac Activity:       Observed  Presentation:           Transverse, head to maternal right  Placenta:               Anterior previa  P. Cord Insertion:      Visualized  Amniotic Fluid  AFI FV:      Within normal limits  AFI Sum(cm)     %Tile       Largest Pocket(cm)  19.79           81          8.99  RUQ(cm)       RLQ(cm)       LUQ(cm)        LLQ(cm)  8.99          3.54          4.5            2.76 ---------------------------------------------------------------------- Biometry  BPD:      54.5  mm     G. Age:  22w 4d          2  %    CI:        67.84   %    70 - 86                                                          FL/HC:      20.4   %    18.7 - 20.9  HC:      211.7  mm     G. Age:  23w 2d          5  %    HC/AC:      1.12        1.05 - 1.21  AC:      188.5  mm     G. Age:  23w 5d         19  %    FL/BPD:     79.1   %    71 - 87  FL:       43.1  mm     G. Age:  24w 1d         27  %    FL/AC:      22.9   %    20 - 24  HUM:      38.1  mm      G. Age:  23w 3d         19  %  LV:        5.1  mm  Est. FW:     621  gm      1 lb 6 oz     36  % ---------------------------------------------------------------------- OB History  Gravidity:    5         Term:   1        Prem:   1        SAB:   2  Living:       2 ---------------------------------------------------------------------- Gestational Age  LMP:           24w 3d  Date:  10/29/18                 EDD:   08/05/19  U/S Today:     23w 3d                                        EDD:   08/12/19  Best:          24w 3d     Det. By:  LMP  (10/29/18)          EDD:   08/05/19 ---------------------------------------------------------------------- Anatomy  Cranium:               Appears normal         Aortic Arch:            Previously seen  Cavum:                 Appears normal         Ductal Arch:            Not well visualized  Ventricles:            Appears normal         Diaphragm:              Appears normal  Choroid Plexus:        Previously seen        Stomach:                Appears normal, left                                                                        sided  Cerebellum:            Previously seen        Abdomen:                Appears normal  Posterior Fossa:       Previously seen        Abdominal Wall:         Appears nml (cord                                                                        insert, abd wall)  Nuchal Fold:           Not applicable (>20    Cord Vessels:           Appears normal ([redacted]                         wks GA)                                        vessel cord)  Face:  Orbits and profile     Kidneys:                Appear normal                         previously seen  Lips:                  Previously seen        Bladder:                Appears normal  Thoracic:              Appears normal         Spine:                  Ltd views no                                                                        intracranial signs of                                                                         NTD  Heart:                 Appears normal         Upper Extremities:      Appears normal                         (4CH, axis, and                         situs)  RVOT:                  Appears normal         Lower Extremities:      Appears normal  LVOT:                  Not well visualized  Other:  Parents do not wish to know sex of fetus. Lt heel and 5th digit          visualized. ---------------------------------------------------------------------- Cervix Uterus Adnexa  Cervix  Length:           5.38  cm. ---------------------------------------------------------------------- Impression  Pregestational diabetes.  Maternal obesity.  Patient returned for completion of fetal anatomy. Amniotic  fluid is normal and good fetal activity is seen. Fetal growth is  appropriate for gestational age. LVOT and ductal arch could  not be evaluated.  Placenta previa is seen. Patient does not give history of  vaginal bleeding. ---------------------------------------------------------------------- Recommendations  -An appointment was made for her to return in 4 weeks for  fetal growth assessment.  -Transvaginal ultrasound to evaluate the placenta. ----------------------------------------------------------------------                  Noralee Space, MD Electronically Signed Final Report   04/18/2019 05:16  pm ----------------------------------------------------------------------   Assessment and Plan:  Pregnancy: [id number] at [redacted]w[redacted]d 1. Class 3 severe obesity due to excess calories with body mass index (BMI) of 50.0 to 59.9 in adult, unspecified whether serious comorbidity present ([place])   2. Insulin controlled White classification A2 gestational diabetes mellitus (GDM) - checking her sugars 4 times per day (she is not putting her values in Babyscripts, doesn't have her monitor or sugar log today) - says that her fasting yesterday was 117, doesn't remember any values, not sure how much  insuin she is taking. She plans to call back tomorrow when she has her book and values  3. Hx of pre-eclampsia in prior pregnancy, currently pregnant - on baby asa  4. Supervision of other normal pregnancy, antepartum - Her MFM u/s last week showed a complete previa but the U/S done on 04-25-19 showed no previa. She has a followup u/s with MFM on 05-15-19.  Preterm labor symptoms and general obstetric precautions including but not limited to vaginal bleeding, contractions, leaking of fluid and fetal movement were reviewed in detail with the patient. I discussed the assessment and treatment plan with the patient. The patient was provided an opportunity to ask questions and all were answered. The patient agreed with the plan and demonstrated an understanding of the instructions. The patient was advised to call back or seek an in-person office evaluation/go to [facility]U at [facility] for any urgent or concerning symptoms. Please refer to After Visit Summary for other counseling recommendations.   I provided 10 minutes of face-to-face time during this encounter.  Return for needs in person visit in 2 weeks.  Future Appointments  Date Time Provider [place]  05/15/2019  3:45 PM [place] NURSE [place] [facility]  05/15/2019  3:45 PM [facility]-MFC [place] 2 [facility]-MFCUS M[facility]-US    Yomayra Tate C Tyrian Peart, [place] for [employer], [place]

## 2019-05-07 ENCOUNTER — Telehealth: Payer: Self-pay | Admitting: *Deleted

## 2019-05-07 ENCOUNTER — Encounter: Payer: Self-pay | Admitting: *Deleted

## 2019-05-07 MED ORDER — INSULIN SYRINGES (DISPOSABLE) U-100 1 ML MISC: 1.0000 | Freq: Three times a day (TID) | 12 refills | Status: DC

## 2019-05-07 NOTE — Progress Notes (Signed)
Copy of CBG's scanned into pts' chart and routed to Dr Hulan Fray.

## 2019-05-07 NOTE — Telephone Encounter (Signed)
Pt requested a new RX for Insulin Syringes 100 70ml.  This sent to [facility].

## 2019-05-14 ENCOUNTER — Telehealth: Payer: Self-pay | Admitting: *Deleted

## 2019-05-14 ENCOUNTER — Encounter: Payer: Self-pay | Admitting: *Deleted

## 2019-05-14 NOTE — Telephone Encounter (Signed)
Pt sent her CBG's into mychart.  I have forwarded them to Dr Gala Romney for review and confirmed the pts' dosage of insulin.  Her Fasting CBG's are elevated and I am concerned with the numbers.  Pt aware that as soon as I hear back from MD will contact her.

## 2019-05-15 ENCOUNTER — Telehealth: Payer: Self-pay | Admitting: *Deleted

## 2019-05-15 ENCOUNTER — Other Ambulatory Visit (HOSPITAL_COMMUNITY): Payer: Self-pay | Admitting: Maternal & Fetal Medicine

## 2019-05-15 ENCOUNTER — Other Ambulatory Visit: Payer: Self-pay

## 2019-05-15 ENCOUNTER — Ambulatory Visit (HOSPITAL_COMMUNITY): Payer: Medicaid Other

## 2019-05-15 ENCOUNTER — Ambulatory Visit (HOSPITAL_COMMUNITY)
Admission: RE | Admit: 2019-05-15 | Discharge: 2019-05-15 | Disposition: A | Discharge status: Home / Self Care | Payer: Medicaid Other | Source: Ambulatory Visit | Attending: Obstetrics and Gynecology | Admitting: Obstetrics and Gynecology

## 2019-05-15 ENCOUNTER — Encounter (HOSPITAL_COMMUNITY): Payer: Self-pay

## 2019-05-15 ENCOUNTER — Encounter: Payer: Self-pay | Admitting: *Deleted

## 2019-05-15 ENCOUNTER — Ambulatory Visit (HOSPITAL_COMMUNITY): Payer: Medicaid Other | Admitting: *Deleted

## 2019-05-15 DIAGNOSIS — Z362 Encounter for other antenatal screening follow-up: Secondary | ICD-10-CM

## 2019-05-15 DIAGNOSIS — O219 Vomiting of pregnancy, unspecified: Secondary | ICD-10-CM | POA: Insufficient documentation

## 2019-05-15 DIAGNOSIS — O09293 Supervision of pregnancy with other poor reproductive or obstetric history, third trimester: Secondary | ICD-10-CM | POA: Diagnosis not present

## 2019-05-15 DIAGNOSIS — O24119 Pre-existing diabetes mellitus, type 2, in pregnancy, unspecified trimester: Secondary | ICD-10-CM

## 2019-05-15 DIAGNOSIS — Z3A28 28 weeks gestation of pregnancy: Secondary | ICD-10-CM

## 2019-05-15 DIAGNOSIS — O99213 Obesity complicating pregnancy, third trimester: Secondary | ICD-10-CM | POA: Diagnosis not present

## 2019-05-15 DIAGNOSIS — O09213 Supervision of pregnancy with history of pre-term labor, third trimester: Secondary | ICD-10-CM | POA: Diagnosis not present

## 2019-05-15 DIAGNOSIS — O24013 Pre-existing diabetes mellitus, type 1, in pregnancy, third trimester: Secondary | ICD-10-CM

## 2019-05-15 NOTE — Telephone Encounter (Signed)
Pt emailed via my chart to increase her NPH to 38 units @ bedtime per Dr Hulan Fray.

## 2019-05-16 ENCOUNTER — Other Ambulatory Visit (HOSPITAL_COMMUNITY): Payer: Self-pay | Admitting: *Deleted

## 2019-05-16 ENCOUNTER — Encounter: Payer: Medicaid Other | Admitting: Family Medicine

## 2019-05-16 DIAGNOSIS — O24019 Pre-existing diabetes mellitus, type 1, in pregnancy, unspecified trimester: Secondary | ICD-10-CM

## 2019-05-20 ENCOUNTER — Encounter: Payer: Medicaid Other | Admitting: Obstetrics & Gynecology

## 2019-05-20 LAB — MATERNIT 21 PLUS CORE, BLOOD
Fetal Fraction: 19
Result (T21): NEGATIVE
Trisomy 13 (Patau syndrome): NEGATIVE
Trisomy 18 (Edwards syndrome): NEGATIVE
Trisomy 21 (Down syndrome): NEGATIVE

## 2019-05-22 ENCOUNTER — Telehealth (HOSPITAL_COMMUNITY): Payer: Self-pay | Admitting: *Deleted

## 2019-05-23 ENCOUNTER — Telehealth (HOSPITAL_COMMUNITY): Payer: Self-pay | Admitting: *Deleted

## 2019-05-27 ENCOUNTER — Other Ambulatory Visit: Payer: Self-pay

## 2019-05-27 ENCOUNTER — Ambulatory Visit (INDEPENDENT_AMBULATORY_CARE_PROVIDER_SITE_OTHER): Payer: Medicaid Other | Admitting: Obstetrics & Gynecology

## 2019-05-27 VITALS — BP 138/76 | HR 105 | Wt 268.0 lb

## 2019-05-27 DIAGNOSIS — O24419 Gestational diabetes mellitus in pregnancy, unspecified control: Secondary | ICD-10-CM | POA: Diagnosis not present

## 2019-05-27 DIAGNOSIS — Z3A30 30 weeks gestation of pregnancy: Secondary | ICD-10-CM | POA: Diagnosis not present

## 2019-05-27 LAB — GLUCOSE, POCT (MANUAL RESULT ENTRY): POC Glucose: 141 mg/dl — AB (ref 70–99)

## 2019-05-27 NOTE — Progress Notes (Signed)
First BP 144/82. Retake BP 138/76    PRENATAL VISIT NOTE  Subjective:  Anna Jordan is a 28 y.o. Z6X0960 at [redacted]w[redacted]d being seen today for ongoing prenatal care.  She is currently monitored for the following issues for this high-risk pregnancy and has Generalized anxiety disorder; History of vertebral compression fracture; Insulin controlled White classification A2 gestational diabetes mellitus (GDM); Hx of pre-eclampsia in prior pregnancy, currently pregnant; Supervision of other normal pregnancy, antepartum; Nausea and vomiting during pregnancy; Type 2 diabetes mellitus affecting pregnancy, antepartum; Late Entry to Bab[facility]cripts- March 2020- Social Distancing; Class 3 severe obesity due to excess calories with body mass index (BMI) of 50.0 to 59.9 in adult [facility]); Lumbar facet joint pain; Spondylosis of lumbar spine; Sacroiliitis ([place]); Mild intermittent asthma with acute exacerbation; and History of shoulder dystocia in prior pregnancy, currently pregnant on their problem list.  Patient reports no complaints.  Contractions: Not present. Vag. Bleeding: None.  Movement: Present. Denies leaking of fluid.   The following portions of the patient's history were reviewed and updated as appropriate: allergies, current medications, past family history, past medical history, past social history, past surgical history and problem list.   Objective:   Vitals:   05/27/19 1442  BP: 138/76  Pulse: (!) 105  Weight: 268 lb (121.6 kg)    Fetal Status: Fetal Heart Rate (bpm): 158   Movement: Present     General:  Alert, oriented and cooperative. Patient is in no acute distress.  Skin: Skin is warm and dry. No rash noted.   Cardiovascular: Normal heart rate noted  Respiratory: Normal respiratory effort, no problems with respiration noted  Abdomen: Soft, gravid, appropriate for gestational age.  Pain/Pressure: Absent     Pelvic: Cervical exam deferred        Extremities: Normal range of motion.  Edema: None   Mental Status: Normal mood and affect. Normal behavior. Normal judgment and thought content.   Assessment and Plan:  Pregnancy: A5W0981 at [redacted]w[redacted]d  1.  Pt takes BP and not crossing with [facility]--RN to call now while patient is here. 2.  Diabetes--Pt tries to enter in [facility] and it is not going on correct date.  Asking [facility] about this as well.  Fasting 130-170.  Pt reports all pp are good 90-120.  Pt to check 2 am sugar tonight and call us.  Pt to add protein bedtime snack.   3.  Fetal echo--missed 5/28 appt.   Will reschedule.   4.  Need to verify insulin doses--pt to call us tomorrow morning with correct insulin doses so we can change appropriately. 5.  Weekly BPP at 32 weeks and serial growth [place] (small humrus and femur with recently negative cfDNA) 6.  BTL papers signed.   Preterm labor symptoms and general obstetric precautions including but not limited to vaginal bleeding, contractions, leaking of fluid and fetal movement were reviewed in detail with the patient. Please refer to After Visit Summary for other counseling recommendations.   No follow-ups on file.  Future Appointments  Date Time Provider [place]  06/12/2019  3:45 PM [facility]-MFC [place] 2 WH-MFCUS M[facility]-US    Silas [place], MD

## 2019-05-29 ENCOUNTER — Telehealth: Payer: Self-pay | Admitting: Obstetrics & Gynecology

## 2019-05-29 ENCOUNTER — Encounter: Payer: Self-pay | Admitting: *Deleted

## 2019-05-29 ENCOUNTER — Other Ambulatory Visit: Payer: Self-pay | Admitting: Obstetrics & Gynecology

## 2019-05-29 MED ORDER — INSULIN NPH (HUMAN) (ISOPHANE) 100 UNIT/ML ~~LOC~~ SUSP: SUBCUTANEOUS | 6 refills | Status: DC

## 2019-05-29 NOTE — Telephone Encounter (Signed)
Trying to reach patient to change bedtime NPH insulin dose.  Fasting are dangerously high.  Pt also took 2 a.m. reading which was 159.  We need to increase her bedtime NPH insulin dose.  I need to verify her dose as it is written as 33 and 38 in the chart.  Numerous phone calls and my chart messages have been sent in past 24 hours.  Voice mail is not set up to leave a message.

## 2019-05-29 NOTE — Progress Notes (Signed)
Pt returned my chart message.  She was taking 38 units NPH at bedtime.  Her dose was increased to 43 units which she should start tonight.  She is to send [place] her fasting blood sugar in the morning on 05/30/19.

## 2019-05-30 ENCOUNTER — Encounter (HOSPITAL_COMMUNITY): Payer: Self-pay | Admitting: Maternal & Fetal Medicine

## 2019-05-31 ENCOUNTER — Other Ambulatory Visit: Payer: Self-pay | Admitting: Obstetrics & Gynecology

## 2019-05-31 MED ORDER — INSULIN NPH (HUMAN) (ISOPHANE) 100 UNIT/ML ~~LOC~~ SUSP: SUBCUTANEOUS | 6 refills | Status: DC

## 2019-05-31 MED ORDER — METFORMIN HCL 500 MG PO TABS: ORAL_TABLET | ORAL | 1 refills | Status: DC

## 2019-05-31 NOTE — Progress Notes (Signed)
Message sent to patient:  I think we should add metformin at night.  Can you start Metormin 500 mg at dinner.  That should help your fastings sugars.  Also, increase your bedtime insulin to 48.  Please continue to send [place] your fasting CBGs.  [employer] is trying to reach you and they said your phone is not working.    Please email [facility]cripts again and see if you can do a three way conversation.    Anna Jordan should send in her fasting CBGs daily via my chart.

## 2019-06-03 ENCOUNTER — Telehealth: Payer: Self-pay | Admitting: *Deleted

## 2019-06-03 ENCOUNTER — Telehealth (INDEPENDENT_AMBULATORY_CARE_PROVIDER_SITE_OTHER): Payer: Medicaid Other | Admitting: Obstetrics & Gynecology

## 2019-06-03 VITALS — BP 123/66 | HR 93 | Wt 269.0 lb

## 2019-06-03 DIAGNOSIS — Z3480 Encounter for supervision of other normal pregnancy, unspecified trimester: Secondary | ICD-10-CM

## 2019-06-03 DIAGNOSIS — O99213 Obesity complicating pregnancy, third trimester: Secondary | ICD-10-CM

## 2019-06-03 DIAGNOSIS — O24419 Gestational diabetes mellitus in pregnancy, unspecified control: Secondary | ICD-10-CM

## 2019-06-03 DIAGNOSIS — Z3A31 31 weeks gestation of pregnancy: Secondary | ICD-10-CM

## 2019-06-03 DIAGNOSIS — O219 Vomiting of pregnancy, unspecified: Secondary | ICD-10-CM

## 2019-06-03 DIAGNOSIS — O24414 Gestational diabetes mellitus in pregnancy, insulin controlled: Secondary | ICD-10-CM

## 2019-06-03 DIAGNOSIS — O09293 Supervision of pregnancy with other poor reproductive or obstetric history, third trimester: Secondary | ICD-10-CM

## 2019-06-03 DIAGNOSIS — O09299 Supervision of pregnancy with other poor reproductive or obstetric history, unspecified trimester: Secondary | ICD-10-CM

## 2019-06-03 MED ORDER — INSULIN NPH (HUMAN) (ISOPHANE) 100 UNIT/ML ~~LOC~~ SUSP: SUBCUTANEOUS | 6 refills | Status: DC

## 2019-06-03 NOTE — Telephone Encounter (Signed)
Left patient a message to call the office to schedule her next [facility] OB appointment for 06/17/2019.

## 2019-06-03 NOTE — Progress Notes (Signed)
TELEHEALTH OBSTETRICS PRENATAL VIRTUAL VIDEO VISIT ENCOUNTER NOTE  Provider [facility]: [facility] [place]   I connected with Anna Jordan Mantell on 06/03/19 at 11:15 AM EDT by MyChart Video Encounter at home and verified that I am speaking with the correct person using two identifiers.   I discussed the limitations, risks, security and privacy concerns of performing an evaluation and management service virtually and the availability of in person appointments. I also discussed with the patient that there may be a patient responsible charge related to this service. The patient expressed understanding and agreed to proceed. Subjective:  Anna Jordan Dedic is a 28 y.o. [id number] at 7458w0d being seen today for ongoing prenatal care.  She is currently monitored for the following issues for this high-risk pregnancy and has Generalized anxiety disorder; History of vertebral compression fracture; Insulin controlled White classification A2 gestational diabetes mellitus (GDM); Hx of pre-eclampsia in prior pregnancy, currently pregnant; Supervision of other normal pregnancy, antepartum; Nausea and vomiting during pregnancy; Type 2 diabetes mellitus affecting pregnancy, antepartum; Late Entry to Babyscripts- March 2020- Social Distancing; Class 3 severe obesity due to excess calories with body mass index (BMI) of 50.0 to 59.9 in adult [facility](HCC); Lumbar facet joint pain; Spondylosis of lumbar spine; Sacroiliitis (HCC); Mild intermittent asthma with acute exacerbation; and History of shoulder dystocia in prior pregnancy, currently pregnant on their problem list.  Patient reports no complaints.  Contractions: Not present. Vag. Bleeding: None.  Movement: Present. Denies any leaking of fluid.   The following portions of the patient's history were reviewed and updated as appropriate: allergies, current medications, past family history, past medical history, past social history, past surgical history and  problem list.   Objective:   Vitals:   06/03/19 1111  BP: 123/66  Pulse: 93  Weight: 269 lb (122 kg)    Fetal Status:     Movement: Present     General:  Alert, oriented and cooperative. Patient is in no acute distress.  Respiratory: Normal respiratory effort, no problems with respiration noted  Mental Status: Normal mood and affect. Normal behavior. Normal judgment and thought content.  Rest of physical exam deferred due to type of encounter  Imaging: [place] Mfm Ob Follow Up  Result Date: 05/16/2019 ----------------------------------------------------------------------  OBSTETRICS REPORT                       (Signed Final 05/16/2019 02:50 pm) ---------------------------------------------------------------------- Patient Info  ID #:       [id number]                          D.O.B.:  1990-11-25 (28 yrs)  Name:       Anna Jordan Falwell                 Visit Date: 05/15/2019 03:50 pm ---------------------------------------------------------------------- Performed By  Performed By:     Eden Lathearrie Stalter BS      Ref. Address:      [address]                    RDMS RVT  [address]                                                              [place], [place]                                                              [id number]  Attending:        Lin Landsmanorenthian Booker      [facility]                                        [facility]  Referred By:      Allie BossierMYRA C Xian Apostol MD ---------------------------------------------------------------------- Orders   #  Description                          Code         Ordered By   1  US MFM OB FOLLOW UP                  [id number]     [facility]  ----------------------------------------------------------------------   #  Order #                    Accession #                 Episode #   1  [id number]                  [id number]                  [id number]   ---------------------------------------------------------------------- Indications   Encounter for other antenatal screening        Z36.2   follow-up   Obesity complicating pregnancy, third          O99.213   trimester (pregravid BMI 50)   Poor obstetric history: Previous preterm       O09.219   delivery, antepartum   Poor obstetric history: Previous               O09.299   preeclampsia / eclampsia/gestational HTN   Pre-existing diabetes, type 1, in pregnancy,   O24.013   third trimester   [redacted] weeks gestation of pregnancy                Z3A.28  ---------------------------------------------------------------------- Vital Signs                                                 Height:        5'2" ---------------------------------------------------------------------- Fetal Evaluation  Num Of Fetuses:          1  Fetal Heart  Rate(bpm):   151  Cardiac Activity:        Observed  Presentation:            Breech  Placenta:                Anterior  P. Cord Insertion:       Previously Visualized  Amniotic Fluid  AFI FV:      Within normal limits  AFI Sum(cm)     %Tile       Largest Pocket(cm)  10.62           16          3.73  RUQ(cm)       RLQ(cm)       LUQ(cm)        LLQ(cm)  3.73          3.12          1.66           2.11 ---------------------------------------------------------------------- Biometry  BPD:      63.7  mm     G. Age:  25w 5d        < 1  %    CI:          62.9  %    70 - 86                                                          FL/HC:       17.8  %    18.8 - 20.6  HC:       259   mm     G. Age:  28w 1d         16  %    HC/AC:       1.05       1.05 - 1.21  AC:      245.8  mm     G. Age:  28w 6d         60  %    FL/BPD:      72.4  %    71 - 87  FL:       46.1  mm     G. Age:  25w 2d        < 1  %    FL/AC:       18.8  %    20 - 24  HUM:      43.4  mm     G. Age:  25w 6d        < 5  %  Est. FW:    1066   gm     2 lb 6 oz     12  % ---------------------------------------------------------------------- OB History   Gravidity:    5         Term:   1        Prem:   1        SAB:   2  Living:       2 ---------------------------------------------------------------------- Gestational Age  LMP:           28w 2d        Date:  10/29/18                 EDD:  08/05/19  U/S Today:     27w 0d                                        EDD:   08/14/19  Best:          28w 2d     Det. By:  LMP  (10/29/18)          EDD:   08/05/19 ---------------------------------------------------------------------- Anatomy  Cranium:               Appears normal         Aortic Arch:            Appears normal  Cavum:                 Appears normal         Ductal Arch:            Not well visualized  Ventricles:            Appears normal         Diaphragm:              Previously seen  Choroid Plexus:        Appears normal         Stomach:                Appears normal, left                                                                        sided  Cerebellum:            Appears normal         Abdomen:                Appears normal  Posterior Fossa:       Appears normal         Abdominal Wall:         Previously seen  Nuchal Fold:           Previously seen        Cord Vessels:           Appears normal (3                                                                        vessel cord)  Face:                  Appears normal         Kidneys:                Appear normal                         (orbits and profile)  Lips:  Appears normal         Bladder:                Appears normal  Thoracic:              Appears normal         Spine:                  Appears normal  Heart:                 Appears normal         Upper Extremities:      Previously seen                         (4CH, axis, and                         situs)  RVOT:                  Appears normal         Lower Extremities:      Previously seen  LVOT:                  Appears normal  Other:  Lt heel and 5th digit previously visualized.  ---------------------------------------------------------------------- Doppler - Fetal Vessels  Umbilical Artery   S/D     %tile     RI              PI                     ADFV    RDFV  3.39       70     0.7            1.19                        No      No ---------------------------------------------------------------------- Cervix Uterus Adnexa  Cervix  Normal appearance by transabdominal scan.  Uterus  No abnormality visualized.  Left Ovary  Within normal limits.  Right Ovary  Within normal limits.  Cul De Sac  No free fluid seen.  Adnexa  No abnormality visualized. ---------------------------------------------------------------------- Impression  Normal interval growth, however, the EFW is at the 12%-  driven by [facility] and Humerus  Genetic screening not performed- I discussed the possiblilty  of constitutional vs genetic cause at this time Ms. Kasprzak  elected cell free DNA as she would not desire additional  diagnostic testing at this time.  Type 1 DM  Placenta appears clear from cervix. ---------------------------------------------------------------------- Recommendations  Follow up growth in 4 weeks  Cell free DNA Drawn  Echocardiogram still pending. ----------------------------------------------------------------------               Lin Landsman, MD Electronically Signed Final Report   05/16/2019 02:50 pm ----------------------------------------------------------------------   Assessment and Plan:  Pregnancy: [id number] at [redacted]w[redacted]d 1. Nausea and vomiting during pregnancy   2. Gestational diabetes mellitus (GDM), antepartum, gestational diabetes method of control unspecified - she reports that her fastings are 129, 130, 152, 166, 143, 144 in the last week                                     2 hour after breakfast 124, 119, 97, 114, 99, and  116                                     2 hour after lunch 96, 132, 121, 127, 114                                     2 hour after supper 98, 104, 115, 120,  117  - She will increase her NPH from 48 to 54 - she will come to get a HBA1C drawn  3. Class 3 severe obesity due to excess calories with body mass index (BMI) of 50.0 to 59.9 in adult, unspecified whether serious comorbidity present (HCC)   4. Insulin controlled White classification A2 gestational diabetes mellitus (GDM)  - Hemoglobin A1c; Future, to be drawn at the lab at her MFM visit - starts testing with MFM on7/29/20  5. Supervision of other normal pregnancy, antepartum   6. History of shoulder dystocia in prior pregnancy, currently pregnant   Preterm labor symptoms and general obstetric precautions including but not limited to vaginal bleeding, contractions, leaking of fluid and fetal movement were reviewed in detail with the patient. I discussed the assessment and treatment plan with the patient. The patient was provided an opportunity to ask questions and all were answered. The patient agreed with the plan and demonstrated an understanding of the instructions. The patient was advised to call back or seek an in-person office evaluation/go to [facility] at [facility] for any urgent or concerning symptoms. Please refer to After Visit Summary for other counseling recommendations.   I provided 10 minutes of face-to-face time during this encounter.  Return in about 2 weeks (around 06/17/2019).  Future Appointments  Date Time Provider Department Center  06/12/2019  3:45 PM WH-MFC [place] 2 WH-MFCUS MFC-US    Allie Bossier, MD [facility] for [facility]

## 2019-06-04 ENCOUNTER — Telehealth: Payer: Self-pay

## 2019-06-04 NOTE — Telephone Encounter (Signed)
Babyscripts called to report an elevated BS on patient. Pt's fasting was 152 this morning. Pt's breakfast reading was 136. Pt states she is feeling fine and taking insulin and meds as directed. Pt encouraged to call the office if she has any more elevated BS or if she starts to have any issues. Pt expressed understanding.

## 2019-06-05 ENCOUNTER — Other Ambulatory Visit: Payer: Self-pay

## 2019-06-05 ENCOUNTER — Telehealth: Payer: Self-pay

## 2019-06-05 DIAGNOSIS — O24419 Gestational diabetes mellitus in pregnancy, unspecified control: Secondary | ICD-10-CM

## 2019-06-05 NOTE — Telephone Encounter (Signed)
Attempted to call pt to discuss Metformin, Insulin and referral to Nutrition & Diabetes per Dr. Gala Romney.  Pt did not answer. VM left asking pt to return call to office. MyChart message also sent to pt.

## 2019-06-05 NOTE — Progress Notes (Signed)
Amb referral to Nutrition and Diabetes placed per Dr.Leggett

## 2019-06-06 NOTE — Telephone Encounter (Signed)
Returned call ref pending Mat 21 results.

## 2019-06-06 NOTE — Telephone Encounter (Signed)
Telephone call  

## 2019-06-12 ENCOUNTER — Ambulatory Visit (HOSPITAL_COMMUNITY)
Admission: RE | Admit: 2019-06-12 | Discharge: 2019-06-12 | Disposition: A | Discharge status: Home / Self Care | Payer: Medicaid Other | Source: Ambulatory Visit | Attending: Obstetrics and Gynecology | Admitting: Obstetrics and Gynecology

## 2019-06-12 ENCOUNTER — Other Ambulatory Visit: Payer: Self-pay

## 2019-06-12 ENCOUNTER — Other Ambulatory Visit: Payer: Medicaid Other

## 2019-06-12 DIAGNOSIS — Z362 Encounter for other antenatal screening follow-up: Secondary | ICD-10-CM

## 2019-06-12 DIAGNOSIS — O24013 Pre-existing diabetes mellitus, type 1, in pregnancy, third trimester: Secondary | ICD-10-CM

## 2019-06-12 DIAGNOSIS — Z3A32 32 weeks gestation of pregnancy: Secondary | ICD-10-CM

## 2019-06-12 DIAGNOSIS — O99213 Obesity complicating pregnancy, third trimester: Secondary | ICD-10-CM | POA: Diagnosis not present

## 2019-06-12 DIAGNOSIS — O24414 Gestational diabetes mellitus in pregnancy, insulin controlled: Secondary | ICD-10-CM | POA: Insufficient documentation

## 2019-06-12 DIAGNOSIS — O24019 Pre-existing diabetes mellitus, type 1, in pregnancy, unspecified trimester: Secondary | ICD-10-CM | POA: Diagnosis not present

## 2019-06-12 DIAGNOSIS — O09293 Supervision of pregnancy with other poor reproductive or obstetric history, third trimester: Secondary | ICD-10-CM

## 2019-06-12 DIAGNOSIS — O09213 Supervision of pregnancy with history of pre-term labor, third trimester: Secondary | ICD-10-CM | POA: Diagnosis not present

## 2019-06-12 DIAGNOSIS — O99343 Other mental disorders complicating pregnancy, third trimester: Secondary | ICD-10-CM

## 2019-06-13 ENCOUNTER — Other Ambulatory Visit: Payer: Self-pay | Admitting: Obstetrics & Gynecology

## 2019-06-13 ENCOUNTER — Other Ambulatory Visit (HOSPITAL_COMMUNITY): Payer: Self-pay | Admitting: *Deleted

## 2019-06-13 DIAGNOSIS — O24414 Gestational diabetes mellitus in pregnancy, insulin controlled: Secondary | ICD-10-CM

## 2019-06-17 ENCOUNTER — Telehealth: Payer: Self-pay

## 2019-06-17 ENCOUNTER — Other Ambulatory Visit: Payer: Self-pay

## 2019-06-17 ENCOUNTER — Telehealth (INDEPENDENT_AMBULATORY_CARE_PROVIDER_SITE_OTHER): Payer: Medicaid Other | Admitting: Obstetrics & Gynecology

## 2019-06-17 ENCOUNTER — Telehealth (INDEPENDENT_AMBULATORY_CARE_PROVIDER_SITE_OTHER): Payer: Self-pay | Admitting: Clinical

## 2019-06-17 ENCOUNTER — Encounter: Payer: Self-pay | Admitting: Obstetrics & Gynecology

## 2019-06-17 VITALS — BP 120/77 | HR 112

## 2019-06-17 DIAGNOSIS — O24113 Pre-existing diabetes mellitus, type 2, in pregnancy, third trimester: Secondary | ICD-10-CM

## 2019-06-17 DIAGNOSIS — F411 Generalized anxiety disorder: Secondary | ICD-10-CM

## 2019-06-17 DIAGNOSIS — O219 Vomiting of pregnancy, unspecified: Secondary | ICD-10-CM

## 2019-06-17 DIAGNOSIS — O24119 Pre-existing diabetes mellitus, type 2, in pregnancy, unspecified trimester: Secondary | ICD-10-CM

## 2019-06-17 DIAGNOSIS — J4521 Mild intermittent asthma with (acute) exacerbation: Secondary | ICD-10-CM

## 2019-06-17 DIAGNOSIS — Z3A33 33 weeks gestation of pregnancy: Secondary | ICD-10-CM

## 2019-06-17 MED ORDER — METFORMIN HCL 500 MG PO TABS: ORAL_TABLET | ORAL | 1 refills | Status: DC

## 2019-06-17 MED ORDER — INSULIN NPH (HUMAN) (ISOPHANE) 100 UNIT/ML ~~LOC~~ SUSP: SUBCUTANEOUS | 6 refills | Status: DC

## 2019-06-17 MED ORDER — INSULIN REGULAR HUMAN 100 UNIT/ML IJ SOLN: INTRAMUSCULAR | 11 refills | Status: DC

## 2019-06-17 NOTE — BH Specialist Note (Signed)
Integrated Behavioral Health via Telemedicine Video Visit  06/17/2019 Anna HitchBrandi Jordan [id number]  Number of Integrated Behavioral Health visits: 1 Session Start time: 4:33  Session End time: 5:58 Total time: 88 minutes  Referring Provider: Dwain SarnaKelly Leggette, MD Type of Visit: Video [facility]/Family [facility] Provider location: [facility] All persons participating in visit: Patient Anna HitchBrandi Jordan and [facility]  Confirmed patient's address: Yes  Confirmed patient's phone number: Yes  Any changes to demographics: No   Confirmed patient's insurance: Yes  Any changes to patient's insurance: No   Discussed confidentiality: Yes   I connected with Anna Jordan  by a video enabled telemedicine application and verified that I am speaking with the correct person using two identifiers.     I discussed the limitations of evaluation and management by telemedicine and the availability of in person appointments.  I discussed that the purpose of this visit is to provide behavioral health care while limiting exposure to the novel coronavirus.   Discussed there is a possibility of technology failure and discussed alternative modes of communication if that failure occurs.  I discussed that engaging in this video visit, they consent to the provision of behavioral healthcare and the services will be billed under their insurance.  Patient and/or legal guardian expressed understanding and consented to video visit: Yes   PRESENTING CONCERNS: Patient and/or family reports the following symptoms/concerns: Pt states her daily symptoms are anxiety, excessive worry,restlessness, fear, guilt, irritability, lack of appetite, lack of quality sleep (up to 5 hours nightly), fatigue, along with panic attacks. Pt has tried numerous self-coping strategies in the past, was in therapy last as a teen. Pt is somewhat open to medication.  Duration of problem: Ongoing since about 28yo; increase in current  pregnancy while unmedicated; Severity of problem: severe  STRENGTHS (Protective Factors/Coping Skills): Self-aware  GOALS ADDRESSED: Patient will: 1.  Reduce symptoms of: anxiety and depression  2.  Increase knowledge and/or ability of: healthy habits  3.  Demonstrate ability to: Increase healthy adjustment to current life circumstances  INTERVENTIONS: Interventions utilized:  Brief CBT and Psychoeducation and/or Health Education Standardized Assessments completed: GAD-7 and PHQ 9  ASSESSMENT: Patient currently experiencing Generalized anxiety disorder.   Patient may benefit from psychoeducation and brief therapeutic interventions regarding coping with symptoms of anxiety and depression .  PLAN: 1. Follow up with behavioral health clinician on : One week 2. Behavioral recommendations:  -Take BH medication, as prescribed by medical provider -One week experiment: spend at least 20 minutes outside every morning with children, the first hour upon waking -One week experiment: Go to sleep right after putting children to bed for the night (instead of cleaning) -Keep track of amount of sleep nightly for one week, after implementing one week experiment(above) -Continue using self-coping strategies that have helped somewhat in the past 3. Referral(s): [facility]  I discussed the assessment and treatment plan with the patient and/or parent/guardian. They were provided an opportunity to ask questions and all were answered. They agreed with the plan and demonstrated an understanding of the instructions.   They were advised to call back or seek an in-person evaluation if the symptoms worsen or if the condition fails to improve as anticipated.  Anna CloseJamie C Jordan  Depression screen [facility] 2/9 06/17/2019  Decreased Interest 3  Down, Depressed, Hopeless 2  PHQ - 2 Score 5  Altered sleeping 3  Tired, decreased energy 3  Change in appetite 3  Feeling bad or  failure about  yourself  2  Trouble concentrating 3  Moving slowly or fidgety/restless 3  Suicidal thoughts 0  PHQ-9 Score 22   GAD 7 : Generalized Anxiety Score 06/17/2019  Nervous, Anxious, on Edge 3  Control/stop worrying 3  Worry too much - different things 3  Trouble relaxing 3  Restless 1  Easily annoyed or irritable 3  Afraid - awful might happen 2  Total GAD 7 Score 18

## 2019-06-17 NOTE — Telephone Encounter (Signed)
Attempted to call pt to start virtual visit. Pt did not answer. VM left asking pt to return call to office.

## 2019-06-17 NOTE — Addendum Note (Signed)
Addended by: Vesta Mixer C on: 06/17/2019 06:23 PM   Modules accepted: Orders

## 2019-06-17 NOTE — Progress Notes (Signed)
TELEHEALTH OBSTETRICS PRENATAL VIRTUAL VIDEO VISIT ENCOUNTER NOTE  [facility]   I connected with J. C. PenneyBrandi Yo on 06/17/19 at 10:30 AM EDT by MyChart Video Encounter at home and verified that I am speaking with the correct person using two identifiers.   I discussed the limitations, risks, security and privacy concerns of performing an evaluation and management service virtually and the availability of in person appointments. I also discussed with the patient that there may be a patient responsible charge related to this service. The patient expressed understanding and agreed to proceed. Subjective:  Anna HitchBrandi Tolle is a 28 y.o. [id number] at 436w0d being seen today for ongoing prenatal care.  She is currently monitored for the following issues for this high-risk pregnancy and has Generalized anxiety disorder; History of vertebral compression fracture; Insulin controlled White classification A2 gestational diabetes mellitus (GDM); Hx of pre-eclampsia in prior pregnancy, currently pregnant; Supervision of other normal pregnancy, antepartum; Nausea and vomiting during pregnancy; Type 2 diabetes mellitus affecting pregnancy, antepartum; Late Entry to Babyscripts- March 2020- Social Distancing; Class 3 severe obesity due to excess calories with body mass index (BMI) of 50.0 to 59.9 in adult [facility](HCC); Lumbar facet joint pain; Spondylosis of lumbar spine; Sacroiliitis (HCC); Mild intermittent asthma with acute exacerbation; and History of shoulder dystocia in prior pregnancy, currently pregnant on their problem list.  Patient reports worsening anxeity, trouble sleeping.  Contractions: Irritability. Vag. Bleeding: None.  Movement: Present. Denies any leaking of fluid.   The following portions of the patient's history were reviewed and updated as appropriate: allergies, current medications, past family history, past medical history, past social history, past  surgical history and problem list.   Objective:   Vitals:   06/17/19 1115  BP: 120/77  Pulse: (!) 112    Fetal Status:     Movement: Present     General:  Alert, oriented and cooperative. Patient is in no acute distress.  Respiratory: Normal respiratory effort, no problems with respiration noted  Mental Status: Normal mood and affect. Normal behavior. Normal judgment and thought content.  Rest of physical exam deferred due to type of encounter  Imaging: Kore[place] Mfm Fetal Bpp Wo Non Stress  Result Date: 06/13/2019 ----------------------------------------------------------------------  OBSTETRICS REPORT                        (Signed Final 06/13/2019 11:55 am) ---------------------------------------------------------------------- Patient Info  ID #:       [id number]                          D.O.B.:  [birth year] (28 yrs)  Name:       Anna Jordan                 Visit Date: 06/12/2019 03:49 pm ---------------------------------------------------------------------- Performed By  Performed By:     Ellin SabaSusan M Kennedy        Ref. Address:      [address] [place]                    [facility]  [address]                                                              [place], [place]                                                              [id number]  Attending:        Lin Landsmanorenthian Booker      [facility]                    MD                                        [facility]  Referred By:      Allie BossierMYRA C DOVE MD ---------------------------------------------------------------------- Orders   #  Description                          Code         Ordered By   1  US MFM OB FOLLOW UP                  76816.01     Lin LandsmanORENTHIAN                                                        BOOKER   2  US MFM FETAL BPP WO NON              76819.01     MYRA DOVE      STRESS  ----------------------------------------------------------------------   #  Order #                     Accession #                 Episode #   1  [id number]                  [id number]   2  [id number]                  [id number]                  [id number]  ---------------------------------------------------------------------- Indications   Encounter for other antenatal screening        Z36.2   follow-up   [redacted] weeks gestation of pregnancy                Z3A.32   Obesity complicating pregnancy, third          O99.213   trimester (pregravid BMI 50)   Poor obstetric history: Previous preterm  O09.219   delivery, antepartum  for pre-eclapsia   Poor obstetric history: Previous               O09.299   preeclampsia / eclampsia/gestational HTN   Pre-existing diabetes, type 1, in pregnancy,   O24.013   third trimester   Other mental disorder complicating             O99.340   pregnancy, third trimester , schizophrenia,   anxiety, depression.   Previously LGA.   Maternity 21 - normal  ---------------------------------------------------------------------- Vital Signs                                                 Height:        5'2" ---------------------------------------------------------------------- Fetal Evaluation  Num Of Fetuses:          1  Fetal Heart Rate(bpm):   150  Cardiac Activity:        Observed  Presentation:            Transverse, head to maternal right  Placenta:                Anterior  Amniotic Fluid  AFI FV:      Within normal limits  AFI Sum(cm)     %Tile       Largest Pocket(cm)  19.21           72          5.3  RUQ(cm)       RLQ(cm)       LUQ(cm)        LLQ(cm)  4.62          5.3           4.77           4.52 ---------------------------------------------------------------------- Biophysical Evaluation  Amniotic F.V:   Within normal limits       F. Tone:         Observed  F. Movement:    Observed                   Score:           8/8  F. Breathing:   Observed ---------------------------------------------------------------------- Biometry  BPD:      77.9  mm     G. Age:   31w 2d         15  %    CI:          70.7  %    70 - 86                                                          FL/HC:       20.8  %    19.1 - 21.3  HC:      295.3  mm     G. Age:  32w 4d         22  %    HC/AC:       0.99       0.96 - 1.17  AC:      296.9  mm     G. Age:  33w  5d         85  %    FL/BPD:      78.9  %    71 - 87  FL:       61.5  mm     G. Age:  31w 6d         27  %    FL/AC:       20.7  %    20 - 24  HUM:      54.6  mm     G. Age:  31w 5d         42  %  LV:        6.7  mm  Est. FW:    2060   gm     4 lb 9 oz     57  % ---------------------------------------------------------------------- OB History  Gravidity:    5         Term:   1        Prem:   1        SAB:   2  Living:       2 ---------------------------------------------------------------------- Gestational Age  LMP:           32w 2d        Date:  10/29/18                 EDD:   08/05/19  U/S Today:     32w 3d                                        EDD:   08/04/19  Best:          32w 2d     Det. By:  LMP  (10/29/18)          EDD:   08/05/19 ---------------------------------------------------------------------- Anatomy  Cranium:               Appears normal         Aortic Arch:            Previously seen  Cavum:                 Appears normal         Ductal Arch:            Not well visualized  Ventricles:            Appears normal         Diaphragm:              Appears normal  Choroid Plexus:        Appears normal         Stomach:                Appears normal, left                                                                        sided  Cerebellum:            Previously seen        Abdomen:  Appears normal  Posterior Fossa:       Previously seen        Abdominal Wall:         Appears nml (cord                                                                        insert, abd wall)  Nuchal Fold:           Not applicable (>20    Cord Vessels:           Appears normal ([redacted]                         wks GA)                                         vessel cord)  Face:                  Appears normal         Kidneys:                Appear normal                         (orbits and profile)  Lips:                  Appears normal         Bladder:                Appears normal  Thoracic:              Appears normal         Spine:                  Previously seen  Heart:                 Appears normal         Upper Extremities:      Previously seen                         (4CH, axis, and                         situs)  RVOT:                  Previously seen        Lower Extremities:      Previously seen  LVOT:                  Previously seen  Other:  Heels and 5th digit previously seen. Technically difficult due to          maternal habitus. ---------------------------------------------------------------------- Cervix Uterus Adnexa  Cervix  Not visualized (advanced GA >24wks) ---------------------------------------------------------------------- Impression  Normal interval growth.  Biophysical profile 8/8  T1 DM  Low risk NIPS ---------------------------------------------------------------------- Recommendations  Repeat growth in 4 weeks  Initiate weekly BPP ----------------------------------------------------------------------  Lin Landsman, MD Electronically Signed Final Report   06/13/2019 11:55 am ----------------------------------------------------------------------  [place] Mfm Ob Follow Up  Result Date: 06/13/2019 ----------------------------------------------------------------------  OBSTETRICS REPORT                        (Signed Final 06/13/2019 11:55 am) ---------------------------------------------------------------------- Patient Info  ID #:       [id number]                          D.O.B.:  [birth year] (28 yrs)  Name:       KIMESHA CLAXTON                 Visit Date: 06/12/2019 03:49 pm ---------------------------------------------------------------------- Performed By  Performed By:     Ellin Saba        Ref.  Address:      [address]                                                              [place], [place]                                                              [address]  Attending:        Lin Landsman      [facility]                                        [facility]  Referred By:      Allie Bossier MD ---------------------------------------------------------------------- Orders   #  Description                          Code         Ordered By   1  [place] MFM OB FOLLOW UP                  19147.82     Lin Landsman  2  [place] MFM FETAL BPP WO NON              [id number]     MYRA DOVE      STRESS  ----------------------------------------------------------------------   #  Order #                    Accession #                 Episode #   1  [id number]                  [id number]                  [id number]   2  [id number]                  [id number]                  [id number]  ---------------------------------------------------------------------- Indications   Encounter for other antenatal screening        Z36.2   follow-up   [redacted] weeks gestation of pregnancy                Z3A.32   Obesity complicating pregnancy, third          O99.213   trimester (pregravid BMI 50)   Poor obstetric history: Previous preterm       O09.219   delivery, antepartum  for pre-eclapsia   Poor obstetric history: Previous               O09.299   preeclampsia / eclampsia/gestational HTN   Pre-existing diabetes, type 1, in pregnancy,   O24.013   third trimester   Other mental disorder complicating             O99.340   pregnancy, third trimester , schizophrenia,   anxiety, depression.   Previously LGA.   Maternity 21 - normal  ---------------------------------------------------------------------- Vital Signs                                                  Height:        5'2" ---------------------------------------------------------------------- Fetal Evaluation  Num Of Fetuses:          1  Fetal Heart Rate(bpm):   150  Cardiac Activity:        Observed  Presentation:            Transverse, head to maternal right  Placenta:                Anterior  Amniotic Fluid  AFI FV:      Within normal limits  AFI Sum(cm)     %Tile       Largest Pocket(cm)  19.21           72          5.3  RUQ(cm)       RLQ(cm)       LUQ(cm)        LLQ(cm)  4.62          5.3           4.77           4.52 ---------------------------------------------------------------------- Biophysical Evaluation  Amniotic F.V:   Within normal limits       F. Tone:  Observed  F. Movement:    Observed                   Score:           8/8  F. Breathing:   Observed ---------------------------------------------------------------------- Biometry  BPD:      77.9  mm     G. Age:  31w 2d         15  %    CI:          70.7  %    70 - 86                                                          FL/HC:       20.8  %    19.1 - 21.3  HC:      295.3  mm     G. Age:  32w 4d         22  %    HC/AC:       0.99       0.96 - 1.17  AC:      296.9  mm     G. Age:  33w 5d         85  %    FL/BPD:      78.9  %    71 - 87  FL:       61.5  mm     G. Age:  31w 6d         27  %    FL/AC:       20.7  %    20 - 24  HUM:      54.6  mm     G. Age:  31w 5d         42  %  LV:        6.7  mm  Est. FW:    2060   gm     4 lb 9 oz     57  % ---------------------------------------------------------------------- OB History  Gravidity:    5         Term:   1        Prem:   1        SAB:   2  Living:       2 ---------------------------------------------------------------------- Gestational Age  LMP:           32w 2d        Date:  10/29/18                 EDD:   08/05/19  U/S Today:     32w 3d                                        EDD:   08/04/19  Best:          32w 2d     Det. By:  LMP  (10/29/18)          EDD:   08/05/19  ---------------------------------------------------------------------- Anatomy  Cranium:               Appears normal  Aortic Arch:            Previously seen  Cavum:                 Appears normal         Ductal Arch:            Not well visualized  Ventricles:            Appears normal         Diaphragm:              Appears normal  Choroid Plexus:        Appears normal         Stomach:                Appears normal, left                                                                        sided  Cerebellum:            Previously seen        Abdomen:                Appears normal  Posterior Fossa:       Previously seen        Abdominal Wall:         Appears nml (cord                                                                        insert, abd wall)  Nuchal Fold:           Not applicable (>20    Cord Vessels:           Appears normal ([redacted]                         wks GA)                                        vessel cord)  Face:                  Appears normal         Kidneys:                Appear normal                         (orbits and profile)  Lips:                  Appears normal         Bladder:                Appears normal  Thoracic:              Appears normal  Spine:                  Previously seen  Heart:                 Appears normal         Upper Extremities:      Previously seen                         (4CH, axis, and                         situs)  RVOT:                  Previously seen        Lower Extremities:      Previously seen  LVOT:                  Previously seen  Other:  Heels and 5th digit previously seen. Technically difficult due to          maternal habitus. ---------------------------------------------------------------------- Cervix Uterus Adnexa  Cervix  Not visualized (advanced GA >24wks) ---------------------------------------------------------------------- Impression  Normal interval growth.  Biophysical profile 8/8  T1 DM  Low risk NIPS  ---------------------------------------------------------------------- Recommendations  Repeat growth in 4 weeks  Initiate weekly BPP ----------------------------------------------------------------------               Lin Landsman, MD Electronically Signed Final Report   06/13/2019 11:55 am ----------------------------------------------------------------------   Assessment and Plan:  Pregnancy: [id number] at [redacted]w[redacted]d 1.  Anxiety Pt was on Klonopin prior to pregnancy and felt uncomfortable being on benzos in pregnancy.  She stopped seeing her prescriber.  Her anxiety has worsened considerably especially the past few weeks.  She is lacking in social support (husband works).  She has trouble coming to the doctro's office and cannot be admitted for glycemic control until weekend b/c she has no one to watch her children.  Will get patient an appt with Integrated behavior therapist at [place] (virtual).  Will need recommendations about treatment.    2. Type 2 diabetes mellitus affecting pregnancy, antepartum CBGs were improving last week until Thursday.  After which all values have been 180 to mid 200s.  Pt has been taking all meds as prescribed.  She has started Benadryl at night for sleep.   Her regular insulin is stacked tid.  She wil ned this addressed during her admission this weekend for glycemic control.   Pt had nml growth and BPP on July 29th with MFM. Pt to enter all values into [facility] so we can follow her closely.  There were not any entries since last Thursday fasting. Kick counts reviewed.  3. Mild intermittent asthma with acute exacerbation Denies problems  Preterm labor symptoms and general obstetric precautions including but not limited to vaginal bleeding, contractions, leaking of fluid and fetal movement were reviewed in detail with the patient.  Fetal kick count twice a day. I discussed the assessment and treatment plan with the patient. The patient was provided an opportunity to ask  questions and all were answered. The patient agreed with the plan and demonstrated an understanding of the instructions. The patient was advised to call back or seek an in-person office evaluation/go to [facility] at [facility] for any urgent or concerning symptoms. Please refer to After Visit Summary for other counseling recommendations.   I provided 20 minutes of face-to-face time during this encounter.  If patient is able to find childcare, she should come to hospital for admission for glycemic control.  o  No follow-ups on file.  Future Appointments  Date Time Provider Department Center  06/20/2019  3:45 PM WH-MFC NURSE WH-MFC MFC-US  06/20/2019  3:45 PM WH-MFC [place] 2 WH-MFCUS MFC-US  06/25/2019 10:15 AM WOC-EDUCATION WOC-WOCA WOC  06/27/2019  4:00 PM WH-MFC NURSE WH-MFC MFC-US  06/27/2019  4:00 PM WH-MFC [place] 2 WH-MFCUS MFC-US  07/03/2019  3:45 PM WH-MFC NURSE WH-MFC MFC-US  07/03/2019  3:45 PM WH-MFC [place] 2 WH-MFCUS MFC-US  07/11/2019  3:45 PM WH-MFC NURSE WH-MFC MFC-US  07/11/2019  3:45 PM WH-MFC [place] 2 WH-MFCUS MFC-US    Elsie Lincoln, MD [facility]

## 2019-06-18 NOTE — Addendum Note (Signed)
Addended by: Vesta Mixer C on: 06/18/2019 10:48 AM   Modules accepted: Orders

## 2019-06-18 NOTE — Addendum Note (Signed)
Addended by: Vesta Mixer C on: 06/18/2019 11:03 AM   Modules accepted: Orders

## 2019-06-20 ENCOUNTER — Other Ambulatory Visit: Payer: Self-pay

## 2019-06-20 ENCOUNTER — Encounter (HOSPITAL_COMMUNITY): Payer: Self-pay

## 2019-06-20 ENCOUNTER — Ambulatory Visit (HOSPITAL_COMMUNITY): Payer: Medicaid Other

## 2019-06-20 ENCOUNTER — Ambulatory Visit (HOSPITAL_COMMUNITY)
Admission: RE | Admit: 2019-06-20 | Discharge: 2019-06-20 | Disposition: A | Discharge status: Home / Self Care | Payer: Medicaid Other | Source: Ambulatory Visit | Attending: Obstetrics and Gynecology | Admitting: Obstetrics and Gynecology

## 2019-06-20 ENCOUNTER — Ambulatory Visit (INDEPENDENT_AMBULATORY_CARE_PROVIDER_SITE_OTHER): Payer: Medicaid Other | Admitting: *Deleted

## 2019-06-20 VITALS — BP 135/77 | HR 117 | Wt 268.0 lb

## 2019-06-20 DIAGNOSIS — O24119 Pre-existing diabetes mellitus, type 2, in pregnancy, unspecified trimester: Secondary | ICD-10-CM

## 2019-06-20 NOTE — Progress Notes (Signed)
Pt here for NST only today.  She is doing twice weekly antenatal testing.  NST reviewed @ bedside.

## 2019-06-21 ENCOUNTER — Inpatient Hospital Stay (HOSPITAL_COMMUNITY)
Admission: AD | Admit: 2019-06-21 | Discharge: 2019-06-24 | DRG: 833 | Disposition: A | Discharge status: Home / Self Care | Payer: Medicaid Other | Attending: Obstetrics & Gynecology | Admitting: Obstetrics & Gynecology

## 2019-06-21 ENCOUNTER — Encounter: Payer: Self-pay | Admitting: Obstetrics and Gynecology

## 2019-06-21 ENCOUNTER — Other Ambulatory Visit: Payer: Self-pay

## 2019-06-21 ENCOUNTER — Other Ambulatory Visit: Payer: Self-pay | Admitting: Obstetrics & Gynecology

## 2019-06-21 DIAGNOSIS — O0990 Supervision of high risk pregnancy, unspecified, unspecified trimester: Secondary | ICD-10-CM

## 2019-06-21 DIAGNOSIS — Z794 Long term (current) use of insulin: Secondary | ICD-10-CM

## 2019-06-21 DIAGNOSIS — Z87891 Personal history of nicotine dependence: Secondary | ICD-10-CM

## 2019-06-21 DIAGNOSIS — O99810 Abnormal glucose complicating pregnancy: Secondary | ICD-10-CM | POA: Diagnosis present

## 2019-06-21 DIAGNOSIS — Z3A33 33 weeks gestation of pregnancy: Secondary | ICD-10-CM

## 2019-06-21 DIAGNOSIS — O99213 Obesity complicating pregnancy, third trimester: Secondary | ICD-10-CM | POA: Diagnosis present

## 2019-06-21 DIAGNOSIS — Z20828 Contact with and (suspected) exposure to other viral communicable diseases: Secondary | ICD-10-CM | POA: Diagnosis present

## 2019-06-21 DIAGNOSIS — O24119 Pre-existing diabetes mellitus, type 2, in pregnancy, unspecified trimester: Secondary | ICD-10-CM | POA: Diagnosis present

## 2019-06-21 DIAGNOSIS — E1165 Type 2 diabetes mellitus with hyperglycemia: Secondary | ICD-10-CM | POA: Diagnosis present

## 2019-06-21 DIAGNOSIS — O24113 Pre-existing diabetes mellitus, type 2, in pregnancy, third trimester: Principal | ICD-10-CM | POA: Diagnosis present

## 2019-06-21 DIAGNOSIS — O99210 Obesity complicating pregnancy, unspecified trimester: Secondary | ICD-10-CM | POA: Diagnosis present

## 2019-06-21 LAB — CBC
HCT: 35.5 % — ABNORMAL LOW (ref 36.0–46.0)
Hemoglobin: 11.4 g/dL — ABNORMAL LOW (ref 12.0–15.0)
MCH: 28.8 pg (ref 26.0–34.0)
MCHC: 32.1 g/dL (ref 30.0–36.0)
MCV: 89.6 fL (ref 80.0–100.0)
Platelets: 213 10*3/uL (ref 150–400)
RBC: 3.96 MIL/uL (ref 3.87–5.11)
RDW: 15 % (ref 11.5–15.5)
WBC: 9.4 10*3/uL (ref 4.0–10.5)
nRBC: 0 % (ref 0.0–0.2)

## 2019-06-21 LAB — TYPE AND SCREEN
ABO/RH(D): O POS
Antibody Screen: NEGATIVE

## 2019-06-21 LAB — HEMOGLOBIN A1C
Hgb A1c MFr Bld: 8 % — ABNORMAL HIGH (ref 4.8–5.6)
Mean Plasma Glucose: 182.9 mg/dL

## 2019-06-21 LAB — COMPREHENSIVE METABOLIC PANEL
ALT: 15 U/L (ref 0–44)
AST: 15 U/L (ref 15–41)
Albumin: 2.3 g/dL — ABNORMAL LOW (ref 3.5–5.0)
Alkaline Phosphatase: 78 U/L (ref 38–126)
Anion gap: 10 (ref 5–15)
BUN: <5 mg/dL — ABNORMAL LOW (ref 6–20)
CO2: 21 mmol/L — ABNORMAL LOW (ref 22–32)
Calcium: 9.3 mg/dL (ref 8.9–10.3)
Chloride: 105 mmol/L (ref 98–111)
Creatinine, Ser: 0.55 mg/dL (ref 0.44–1.00)
GFR calc Af Amer: >60 mL/min (ref >60)
GFR calc non Af Amer: >60 mL/min (ref >60)
Glucose, Bld: 212 mg/dL — ABNORMAL HIGH (ref 70–99)
Potassium: 3.7 mmol/L (ref 3.5–5.1)
Sodium: 136 mmol/L (ref 135–145)
Total Bilirubin: 0.6 mg/dL (ref 0.3–1.2)
Total Protein: 5.4 g/dL — ABNORMAL LOW (ref 6.5–8.1)

## 2019-06-21 LAB — GLUCOSE, CAPILLARY
Glucose-Capillary: 190 mg/dL — ABNORMAL HIGH (ref 70–99)
Glucose-Capillary: 213 mg/dL — ABNORMAL HIGH (ref 70–99)

## 2019-06-21 LAB — SARS CORONAVIRUS 2 BY RT PCR (HOSPITAL ORDER, PERFORMED IN ~~LOC~~ HOSPITAL LAB): SARS Coronavirus 2: NEGATIVE

## 2019-06-21 MED ORDER — INSULIN NPH (HUMAN) (ISOPHANE) 100 UNIT/ML ~~LOC~~ SUSP
70.0000 [IU] | Freq: Every day | SUBCUTANEOUS | Status: DC
  Administered 2019-06-21: 70 [IU] via SUBCUTANEOUS
  Filled 2019-06-21: qty 10

## 2019-06-21 MED ORDER — ZOLPIDEM TARTRATE 5 MG PO TABS
5.0000 mg | ORAL_TABLET | Freq: Every evening | ORAL | Status: DC | PRN
  Administered 2019-06-21 – 2019-06-23 (×3): 5 mg via ORAL
  Filled 2019-06-21 (×3): qty 1

## 2019-06-21 MED ORDER — INSULIN NPH (HUMAN) (ISOPHANE) 100 UNIT/ML ~~LOC~~ SUSP
40.0000 [IU] | Freq: Every day | SUBCUTANEOUS | Status: DC
  Administered 2019-06-22: 40 [IU] via SUBCUTANEOUS

## 2019-06-21 MED ORDER — NON FORMULARY: 30.0000 [IU] | Freq: Three times a day (TID) | Status: DC

## 2019-06-21 MED ORDER — INSULIN ASPART 100 UNIT/ML ~~LOC~~ SOLN
30.0000 [IU] | Freq: Three times a day (TID) | SUBCUTANEOUS | Status: DC
  Administered 2019-06-21 – 2019-06-22 (×4): 30 [IU] via SUBCUTANEOUS

## 2019-06-21 MED ORDER — DOCUSATE SODIUM 100 MG PO CAPS
100.0000 mg | ORAL_CAPSULE | Freq: Every day | ORAL | Status: DC
  Administered 2019-06-23 – 2019-06-24 (×2): 100 mg via ORAL
  Filled 2019-06-21 (×3): qty 1

## 2019-06-21 MED ORDER — PRENATAL MULTIVITAMIN CH
1.0000 | ORAL_TABLET | Freq: Every day | ORAL | Status: DC
  Administered 2019-06-22 – 2019-06-24 (×3): 1 via ORAL
  Filled 2019-06-21 (×2): qty 1

## 2019-06-21 MED ORDER — ACETAMINOPHEN 325 MG PO TABS
650.0000 mg | ORAL_TABLET | ORAL | Status: DC | PRN
  Administered 2019-06-22: 20:00:00 650 mg via ORAL
  Filled 2019-06-21: qty 2

## 2019-06-21 MED ORDER — CALCIUM CARBONATE ANTACID 500 MG PO CHEW: 2.0000 | CHEWABLE_TABLET | ORAL | Status: DC | PRN

## 2019-06-21 NOTE — Progress Notes (Signed)
Pt having uncontrolled blood sugars at home.  She reports fastings 150-180 and post prandials up to 300.  Have increased insulin numerous times as outpatient without success.  Added metformin as well.  Pt has not recorded any CBGs in Babyscripts this week.  I offered admission early this week and patient refused b/c she does not have child care.  Pt having increased anxiety and saw our behavioral therapist. Medications were suggested and Laquana has declined them.  She does not want to expose her baby to more medications.  Psychiatry and further behavioral health not available for 3 weeks.  Pt will continue to see Luetta Nutting in our office.    Dr. Ilda Basset will be the admitting attending on the antepartum floor today when patient arrives.  Case discussed.  Will get diabetes consult on arrival.

## 2019-06-21 NOTE — H&P (Addendum)
Anna Jordan is a 28 y.o. married G5P2A2 (28yo and 49 month old)  female presenting for diabetic control. She is at 33.[redacted] weeks EGA. She is followed in the [place] office and her sugars are pretty bad. Yesterday her fasting was 160 and everything was more than 260. Good FM. No VB, ROM, or CTX (other than Braxton-Hicks).   OB History    Gravida  5   Para  2   Term  1   Preterm  1   AB  2   Living  2     SAB  2   TAB      Ectopic      Multiple      Live Births  2          Past Medical History:  Diagnosis Date  . Anxiety   . Depression   . Gestational diabetes   . Hypertension   . Obesity   . Schizophrenia [facility])    Past Surgical History:  Procedure Laterality Date  . GALLBLADDER SURGERY  08/2018  . WISDOM TOOTH EXTRACTION     Family History: family history includes Breast cancer in her maternal aunt, maternal grandmother, paternal grandfather, and paternal grandmother; Depression in her mother; Diabetes in her brother and mother; Heart disease in her mother; Hypertension in her father; Stroke in her father. Social History:  reports that she quit smoking about 2 years ago. Her smoking use included cigarettes. She has a 7.50 pack-year smoking history. She has never used smokeless tobacco. She reports that she does not drink alcohol or use drugs.     Maternal Diabetes: Yes:  Diabetes Type:  Insulin/Medication controlled, GDM with her previous pregnancies. Genetic Screening: NIPS normal Maternal Ultrasounds/Referrals: Normal Fetal Ultrasounds or other Referrals:  Fetal echo normal Maternal Substance Abuse:  No Significant Maternal Medications:  Meds include: Other:  insulin and metformin Significant Maternal Lab Results:  Other: HBA1C 7.2 Other Comments:  None  ROS She is a homemaker. History   Blood pressure 115/65, pulse (!) 115, temperature 98.3 F (36.8 C), temperature source Oral, resp. rate 20, height 5\' 3"  (1.6 m), weight 121.6 kg, last menstrual  period 10/29/2018, SpO2 97 %, unknown if currently breastfeeding. Exam Physical Exam  Breathing, conversing, and ambulating normally Well nourished, well hydrated White female, no apparent distress Heart- rrr Lungs- CTAB Abd- benign, obese, gravid Prenatal labs: ABO, Rh: --/--/PENDING (08/07 1814) Antibody: PENDING (08/07 1814) Rubella: <0.90 (03/03 0906) RPR: NON-REACTIVE (03/03 0906)  HBsAg: NON-REACTIVE (03/03 0906)  HIV: NON-REACTIVE (03/03 0906)  GBS:     Assessment/Plan: IDDM at 33.[redacted] weeks EGA- plan for admission for diabetic control I will d/c the metformin and use only insulin Diabetic coordinator consult ordered. I called the phone number listed and left a voice mail. Check HBA1C    Evren Shankland C Cristol Engdahl 06/21/2019, 7:01 PM

## 2019-06-22 ENCOUNTER — Encounter (HOSPITAL_COMMUNITY): Payer: Self-pay

## 2019-06-22 LAB — ABO/RH: ABO/RH(D): O POS

## 2019-06-22 LAB — GLUCOSE, CAPILLARY
Glucose-Capillary: 136 mg/dL — ABNORMAL HIGH (ref 70–99)
Glucose-Capillary: 206 mg/dL — ABNORMAL HIGH (ref 70–99)
Glucose-Capillary: 203 mg/dL — ABNORMAL HIGH (ref 70–99)
Glucose-Capillary: 180 mg/dL — ABNORMAL HIGH (ref 70–99)
Glucose-Capillary: 154 mg/dL — ABNORMAL HIGH (ref 70–99)
Glucose-Capillary: 166 mg/dL — ABNORMAL HIGH (ref 70–99)

## 2019-06-22 MED ORDER — INSULIN NPH (HUMAN) (ISOPHANE) 100 UNIT/ML ~~LOC~~ SUSP
76.0000 [IU] | Freq: Every day | SUBCUTANEOUS | Status: DC
  Administered 2019-06-22 – 2019-06-23 (×2): 76 [IU] via SUBCUTANEOUS
  Filled 2019-06-22: qty 10

## 2019-06-22 NOTE — Progress Notes (Signed)
Inpatient Diabetes Program Recommendations  Diabetes Treatment Program Recommendations  ADA Standards of Care 2020 Diabetes in Pregnancy Target Glucose Ranges:  Fasting: 60 - 90 mg/dL Preprandial: 60 - 105 mg/dL 1 hr postprandial: Less than 140mg /dL (from first bite of meal) 2 hr postprandial: Less than 120 mg/dL (from first bite of meal)  Results for KHANH, CORDNER (MRN [id number]) as of 06/22/2019 07:40  Ref. Range 06/21/2019 17:52 06/21/2019 21:26 06/22/2019 06:16  Glucose-Capillary Latest Ref Range: 70 - 99 mg/dL 190 (H) 213 (H)  Novolog 30 units  NPH 70 units 136 (H)    Review of Glycemic Control  Diabetes history: DM2; [redacted]W[redacted]D gestation Outpatient Diabetes medications: NPH 32 units QAM, NPH 60 units QHS, Regular 28 units with breakfast, Regular 24 units with lunch, Regular 35 units with supper, Metformin 500 mg BID Current orders for Inpatient glycemic control: NPH 40 units QAM, NPH 76 units QHS, Novolog 30 units TID with meals  Inpatient Diabetes Program Recommendations:   Insulin-Correction: Please use Diabetic Pregnant Patient order set and consider ordering Novolog 0-32 units QID (fasting and 2 hour post prandial) for correction.  NOTE: Noted consult for Diabetes Coordinator. Diabetes Coordinator is not on campus over the weekend but available by pager from 8am to 5pm for questions or concerns. Chart reviewed. Noted patient received NPH 70 units last night and fasting glucose 136 mg/dl this morning. NPH dose has already been increased to 76 units QHS. Recommend adding Novolog correction scale in addition to current insulin orders. Will continue to follow and make further recommendations if needed as more data on glycemic control is collected.   Thanks, Barnie Alderman, RN, MSN, CDE Diabetes Coordinator Inpatient Diabetes Program [phone] (Team Pager from 8am to 5pm)

## 2019-06-22 NOTE — Progress Notes (Addendum)
Patient ID: Anna Jordan, female   DOB: 1990/12/02, 28 y.o.   MRN: [id number] S. No complaints, good FM O. VSS, AF FHR- reassuring Hs sugar was 213 Fasting this morning 136 (big improvement) HBA1C 8.0 (increased from 3 months ago)  A/p. Poorly controlled IDDM (type 2) at 33 5/[redacted] weeks EGA- continue new insulin dosing and follow. I increased her qh NPH from 70 to 76.

## 2019-06-23 DIAGNOSIS — Z3A33 33 weeks gestation of pregnancy: Secondary | ICD-10-CM | POA: Diagnosis not present

## 2019-06-23 DIAGNOSIS — Z20828 Contact with and (suspected) exposure to other viral communicable diseases: Secondary | ICD-10-CM | POA: Diagnosis present

## 2019-06-23 DIAGNOSIS — E1165 Type 2 diabetes mellitus with hyperglycemia: Secondary | ICD-10-CM | POA: Diagnosis present

## 2019-06-23 DIAGNOSIS — Z794 Long term (current) use of insulin: Secondary | ICD-10-CM | POA: Diagnosis not present

## 2019-06-23 DIAGNOSIS — Z87891 Personal history of nicotine dependence: Secondary | ICD-10-CM | POA: Diagnosis not present

## 2019-06-23 DIAGNOSIS — O99213 Obesity complicating pregnancy, third trimester: Secondary | ICD-10-CM | POA: Diagnosis present

## 2019-06-23 DIAGNOSIS — O24113 Pre-existing diabetes mellitus, type 2, in pregnancy, third trimester: Secondary | ICD-10-CM | POA: Diagnosis present

## 2019-06-23 DIAGNOSIS — Z3A34 34 weeks gestation of pregnancy: Secondary | ICD-10-CM | POA: Diagnosis not present

## 2019-06-23 LAB — GLUCOSE, CAPILLARY
Glucose-Capillary: 163 mg/dL — ABNORMAL HIGH (ref 70–99)
Glucose-Capillary: 206 mg/dL — ABNORMAL HIGH (ref 70–99)
Glucose-Capillary: 171 mg/dL — ABNORMAL HIGH (ref 70–99)
Glucose-Capillary: 179 mg/dL — ABNORMAL HIGH (ref 70–99)
Glucose-Capillary: 149 mg/dL — ABNORMAL HIGH (ref 70–99)
Glucose-Capillary: 165 mg/dL — ABNORMAL HIGH (ref 70–99)

## 2019-06-23 MED ORDER — INSULIN NPH (HUMAN) (ISOPHANE) 100 UNIT/ML ~~LOC~~ SUSP
48.0000 [IU] | Freq: Every day | SUBCUTANEOUS | Status: DC
  Administered 2019-06-23 – 2019-06-24 (×2): 48 [IU] via SUBCUTANEOUS

## 2019-06-23 MED ORDER — INSULIN ASPART 100 UNIT/ML ~~LOC~~ SOLN
35.0000 [IU] | Freq: Three times a day (TID) | SUBCUTANEOUS | Status: DC
  Administered 2019-06-23 – 2019-06-24 (×4): 35 [IU] via SUBCUTANEOUS

## 2019-06-23 NOTE — Progress Notes (Signed)
Patient ID: Anna Jordan, female   DOB: [birth year], 28 y.o.   MRN: [id number] [place]) NOTE  Anna Jordan is a 28 y.o. [id number] at [redacted]w[redacted]d by best clinical estimate who is admitted for poorly controlled diabetes.   Fetal presentation is unsure. Length of Stay:  1  Days  Subjective: Initially stated she needed to leave today, now has changed her mind Patient reports the fetal movement as active. Patient reports uterine contraction  activity as none. Patient reports  vaginal bleeding as none. Patient describes fluid per vagina as None.  Vitals:  Blood pressure (!) 94/57, pulse 99, temperature 98.3 F (36.8 C), temperature source Oral, resp. rate 18, height 5\' 3"  (1.6 m), weight 121.6 kg, last menstrual period 10/29/2018, SpO2 100 %, unknown if currently breastfeeding. Physical Examination:  General appearance - alert, well appearing, and in no distress Chest - normal effort Abdomen - gravid, non-tender Fundal Height:  size greater than dates Extremities: Homans sign is negative, no sign of DVT  Membranes:intact  Fetal Monitoring:  Baseline: 140 bpm, Variability: Good {> 6 bpm), Accelerations: Reactive and Decelerations: Absent  Labs:  Results for orders placed or performed during the hospital encounter of 06/21/19 (from the past 24 hour(s))  Glucose, capillary   Collection Time: 06/22/19 10:26 AM  Result Value Ref Range   Glucose-Capillary 206 (H) 70 - 99 mg/dL  Glucose, capillary   Collection Time: 06/22/19  1:27 PM  Result Value Ref Range   Glucose-Capillary 203 (H) 70 - 99 mg/dL  Glucose, capillary   Collection Time: 06/22/19  3:30 PM  Result Value Ref Range   Glucose-Capillary 180 (H) 70 - 99 mg/dL   Comment 1 Notify RN   Glucose, capillary   Collection Time: 06/22/19  6:06 PM  Result Value Ref Range   Glucose-Capillary 154 (H) 70 - 99 mg/dL  Glucose, capillary   Collection Time: 06/22/19  8:08 PM  Result Value Ref Range   Glucose-Capillary 166 (H) 70 - 99 mg/dL     Medications:  Scheduled . docusate sodium  100 mg Oral Daily  . insulin aspart  35 Units Subcutaneous TID WC  . insulin NPH Human  48 Units Subcutaneous QAC breakfast  . insulin NPH Human  76 Units Subcutaneous QHS  . prenatal multivitamin  1 tablet Oral Q1200   I have reviewed the patient's current medications.  ASSESSMENT: Active Problems:   Hyperglycemia in pregnancy Still not in good control  PLAN: Increased am NPH to 48 today and meal coverage from 30-->35  Anna Jude, MD 06/23/2019,7:56 AM

## 2019-06-23 NOTE — Progress Notes (Signed)
Inpatient Diabetes Program Recommendations  Diabetes Treatment Program Recommendations  ADA Standards of Care 2020 Diabetes in Pregnancy Target Glucose Ranges:  Fasting: 60 - 90 mg/dL Preprandial: 60 - 105 mg/dL 1 hr postprandial: Less than 140mg /dL (from first bite of meal) 2 hr postprandial: Less than 120 mg/dL (from first bite of meal)   Results for JASELLE, PRYER (MRN [id number]) as of 06/23/2019 08:50  Ref. Range 06/22/2019 06:16 06/22/2019 10:26 06/22/2019 13:27 06/22/2019 15:30 06/22/2019 18:06 06/22/2019 20:08 06/23/2019 08:14  Glucose-Capillary Latest Ref Range: 70 - 99 mg/dL 136 (H) 206 (H) 203 (H) 180 (H) 154 (H) 166 (H) 163 (H)    Review of Glycemic Control  Diabetes history: DM2; [redacted]W[redacted]D gestation Outpatient Diabetes medications: NPH 32 units QAM, NPH 60 units QHS, Regular 28 units with breakfast, Regular 24 units with lunch, Regular 35 units with supper, Metformin 500 mg BID Current orders for Inpatient glycemic control: NPH 48 units QAM, NPH 76 units QHS, Novolog 35 units TID with meals  Inpatient Diabetes Program Recommendations:   Insulin-Correction: Please use Diabetic Pregnant Patient order set and consider ordering Novolog 0-32 units QID (fasting and 2 hour post prandial) for correction.  NOTE: Noted morning NPH increased to 48 units QAM and meal coverage increased to Novolog 35 units today. Recommend ordering correction scale as well as Novolog 0-32 units QID (fasting and 2 hour post prandial).  Thanks, Barnie Alderman, RN, MSN, CDE Diabetes Coordinator Inpatient Diabetes Program [phone] (Team Pager from 8am to 5pm)

## 2019-06-24 ENCOUNTER — Other Ambulatory Visit: Payer: Medicaid Other

## 2019-06-24 ENCOUNTER — Encounter (HOSPITAL_COMMUNITY): Payer: Self-pay | Admitting: Obstetrics & Gynecology

## 2019-06-24 ENCOUNTER — Telehealth: Payer: Medicaid Other | Admitting: Obstetrics & Gynecology

## 2019-06-24 DIAGNOSIS — E1165 Type 2 diabetes mellitus with hyperglycemia: Secondary | ICD-10-CM | POA: Diagnosis present

## 2019-06-24 DIAGNOSIS — Z3A34 34 weeks gestation of pregnancy: Secondary | ICD-10-CM

## 2019-06-24 DIAGNOSIS — O24113 Pre-existing diabetes mellitus, type 2, in pregnancy, third trimester: Principal | ICD-10-CM

## 2019-06-24 LAB — GLUCOSE, CAPILLARY
Glucose-Capillary: 154 mg/dL — ABNORMAL HIGH (ref 70–99)
Glucose-Capillary: 182 mg/dL — ABNORMAL HIGH (ref 70–99)
Glucose-Capillary: 133 mg/dL — ABNORMAL HIGH (ref 70–99)

## 2019-06-24 LAB — TYPE AND SCREEN
ABO/RH(D): O POS
Antibody Screen: NEGATIVE

## 2019-06-24 MED ORDER — INSULIN NPH (HUMAN) (ISOPHANE) 100 UNIT/ML ~~LOC~~ SUSP: 55.0000 [IU] | Freq: Every day | SUBCUTANEOUS | Status: DC

## 2019-06-24 MED ORDER — INSULIN NPH (HUMAN) (ISOPHANE) 100 UNIT/ML ~~LOC~~ SUSP
80.0000 [IU] | Freq: Every day | SUBCUTANEOUS | Status: DC
  Filled 2019-06-24: qty 10

## 2019-06-24 MED ORDER — INSULIN NPH (HUMAN) (ISOPHANE) 100 UNIT/ML ~~LOC~~ SUSP: 80.0000 [IU] | Freq: Every day | SUBCUTANEOUS | 11 refills | Status: DC

## 2019-06-24 MED ORDER — INSULIN NPH (HUMAN) (ISOPHANE) 100 UNIT/ML ~~LOC~~ SUSP: 55.0000 [IU] | Freq: Every day | SUBCUTANEOUS | 11 refills | Status: DC

## 2019-06-24 MED ORDER — INSULIN ASPART 100 UNIT/ML ~~LOC~~ SOLN: 40.0000 [IU] | Freq: Three times a day (TID) | SUBCUTANEOUS | 11 refills | Status: DC

## 2019-06-24 MED ORDER — INSULIN ASPART 100 UNIT/ML ~~LOC~~ SOLN
40.0000 [IU] | Freq: Three times a day (TID) | SUBCUTANEOUS | Status: DC
  Administered 2019-06-24: 40 [IU] via SUBCUTANEOUS

## 2019-06-24 NOTE — Progress Notes (Signed)
Inpatient Diabetes Program Recommendations  Inpatient Diabetes Program Recommendations  Diabetes Treatment Program Recommendations  ADA Standards of Care 2018 Diabetes in Pregnancy Target Glucose Ranges:  Fasting: 60 - 90 mg/dL Preprandial: 60 - 105 mg/dL 1 hr postprandial: Less than 140mg /dL (from first bite of meal) 2 hr postprandial: Less than 120 mg/dL (from first bite of meal)    Lab Results  Component Value Date   GLUCAP 154 (H) 06/24/2019   HGBA1C 8.0 (H) 06/21/2019    Review of Glycemic Control Results for Anna Jordan, Anna Jordan (MRN [id number]) as of 06/24/2019 09:32  Ref. Range 06/23/2019 15:40 06/23/2019 18:26 06/23/2019 20:27 06/24/2019 08:38  Glucose-Capillary Latest Ref Range: 70 - 99 mg/dL 179 (H) 149 (H) 165 (H) 154 (H)   Diabetes history:DM2; [redacted]W[redacted]D gestation Outpatient Diabetes medications:NPH 32 units QAM, NPH 60 units QHS, Regular 28 units with breakfast, Regular 24 units with lunch, Regular 35 units with supper, Metformin 500 mg BID Current orders for Inpatient glycemic control:NPH 48 units QAM, NPH 76 units QHS, Novolog 35 units TID with meals  Inpatient Diabetes Program Recommendations:  Please use Diabetic Pregnant Patient order set and consider ordering Novolog 0-32 units QID (fasting and 2 hour post prandial) for correction.  Also, consider increasing NPH to 80 units QHS and 56 units QAM.   Thanks, Bronson Curb, MSN, RNC-OB Diabetes Coordinator [phone] (8a-5p)

## 2019-06-24 NOTE — Discharge Summary (Signed)
Antenatal Physician Discharge Summary  Patient ID: Anna Jordan MRN: [id number] DOB/AGE: [birth year] 28 y.o.  Admit date: 06/21/2019 Discharge date: 06/24/2019  Admission Diagnoses:  Principal Problem:   Type 2 diabetes mellitus affecting pregnancy, antepartum Active Problems:   Supervision of high-risk pregnancy   Maternal morbid obesity, antepartum (HCC)   Poorly controlled type 2 diabetes mellitus (HCC)  Discharge Diagnoses:  The same  Prenatal Procedures: NST  Consults: Diabetes Coordinator  Hospital Course:  This is a 28 y.o. [id number] with IUP at [redacted]w[redacted]d admitted for poorly controlled diabetes. She underwent insulin regimen changes to help with this, and her sugars improved. She was observed, fetal heart rate monitoring remained reassuring, and she had no signs/symptoms of preterm labor or other maternal-fetal concerns.  She desired discharge to home on HD#4, will continue diabetic management outpatient. Insulin regimen at discharge NPH 55/80, Novolog 40 QAC. She was deemed stable for discharge to home with outpatient follow up.  Discharge Exam: Temp:  [98.5 F (36.9 C)-98.7 F (37.1 C)] 98.5 F (36.9 C) (08/10 1327) Pulse Rate:  [102-107] 103 (08/10 1327) Resp:  [18] 18 (08/10 1327) BP: (97-124)/(58-68) 124/66 (08/10 1327) SpO2:  [98 %-100 %] 100 % (08/10 1327) Physical Examination: CONSTITUTIONAL: Well-developed, well-nourished female in no acute distress.  HENT:  Normocephalic, atraumatic, External right and left ear normal. Oropharynx is clear and moist EYES: Conjunctivae and EOM are normal. Pupils are equal, round, and reactive to light. No scleral icterus.  NECK: Normal range of motion, supple, no masses SKIN: Skin is warm and dry. No rash noted. Not diaphoretic. No erythema. No pallor. NEUROLGIC: Alert and oriented to person, place, and time. Normal reflexes, muscle tone coordination. No cranial nerve deficit noted. PSYCHIATRIC: Normal mood and affect. Normal behavior.  Normal judgment and thought content. CARDIOVASCULAR: Normal heart rate noted, regular rhythm RESPIRATORY: Effort and breath sounds normal, no problems with respiration noted MUSCULOSKELETAL: Normal range of motion. No edema and no tenderness. 2+ distal pulses. ABDOMEN: Soft, obese, nontender, nondistended, gravid. CERVIX:  Deferred  Fetal monitoring: FHR: 130 bpm, Variability: moderate, Accelerations: Present, Decelerations: Absent  Uterine activity: No contractions  Significant Diagnostic Studies:  Results for orders placed or performed during the hospital encounter of 06/21/19 (from the past 168 hour(s))  SARS Coronavirus 2 [facility] order, Performed in [facility] hospital lab) Nasopharyngeal Nasopharyngeal Swab   Collection Time: 06/21/19  5:29 PM   Specimen: Nasopharyngeal Swab  Result Value Ref Range   SARS Coronavirus 2 NEGATIVE NEGATIVE  Glucose, capillary   Collection Time: 06/21/19  5:52 PM  Result Value Ref Range   Glucose-Capillary 190 (H) 70 - 99 mg/dL  CBC on admission   Collection Time: 06/21/19  6:14 PM  Result Value Ref Range   WBC 9.4 4.0 - 10.5 K/uL   RBC 3.96 3.87 - 5.11 MIL/uL   Hemoglobin 11.4 (L) 12.0 - 15.0 g/dL   HCT 69.6 (L) 29.5 - 28.4 %   MCV 89.6 80.0 - 100.0 fL   MCH 28.8 26.0 - 34.0 pg   MCHC 32.1 30.0 - 36.0 g/dL   RDW 13.2 44.0 - 10.2 %   Platelets 213 150 - 400 K/uL   nRBC 0.0 0.0 - 0.2 %  Comprehensive metabolic panel   Collection Time: 06/21/19  6:14 PM  Result Value Ref Range   Sodium 136 135 - 145 mmol/L   Potassium 3.7 3.5 - 5.1 mmol/L   Chloride 105 98 - 111 mmol/L   CO2 21 (L) 22 - 32 mmol/L  Glucose, Bld 212 (H) 70 - 99 mg/dL   BUN <5 (L) 6 - 20 mg/dL   Creatinine, Ser 1.61 0.44 - 1.00 mg/dL   Calcium 9.3 8.9 - 09.6 mg/dL   Total Protein 5.4 (L) 6.5 - 8.1 g/dL   Albumin 2.3 (L) 3.5 - 5.0 g/dL   AST 15 15 - 41 U/L   ALT 15 0 - 44 U/L   Alkaline Phosphatase 78 38 - 126 U/L   Total Bilirubin 0.6 0.3 - 1.2 mg/dL   GFR calc non  Af Amer >60 >60 mL/min   GFR calc Af Amer >60 >60 mL/min   Anion gap 10 5 - 15  Type and screen   Collection Time: 06/21/19  6:14 PM  Result Value Ref Range   ABO/RH(D) O POS    Antibody Screen NEG    Sample Expiration      06/24/2019,2359 Performed at [facility], [address], [place], [place] [address]   ABO/Rh   Collection Time: 06/21/19  6:14 PM  Result Value Ref Range   ABO/RH(D) O POS    No rh immune globuloin      NOT A RH IMMUNE GLOBULIN CANDIDATE, PT RH POSITIVE Performed at [facility], [address], [place], [place] [address]   Hemoglobin A1c   Collection Time: 06/21/19  7:30 PM  Result Value Ref Range   Hgb A1c MFr Bld 8.0 (H) 4.8 - 5.6 %   Mean Plasma Glucose 182.9 mg/dL  Glucose, capillary   Collection Time: 06/21/19  9:26 PM  Result Value Ref Range   Glucose-Capillary 213 (H) 70 - 99 mg/dL  Glucose, capillary   Collection Time: 06/22/19  6:16 AM  Result Value Ref Range   Glucose-Capillary 136 (H) 70 - 99 mg/dL  Glucose, capillary   Collection Time: 06/22/19 10:26 AM  Result Value Ref Range   Glucose-Capillary 206 (H) 70 - 99 mg/dL  Glucose, capillary   Collection Time: 06/22/19  1:27 PM  Result Value Ref Range   Glucose-Capillary 203 (H) 70 - 99 mg/dL  Glucose, capillary   Collection Time: 06/22/19  3:30 PM  Result Value Ref Range   Glucose-Capillary 180 (H) 70 - 99 mg/dL   Comment 1 Notify RN   Glucose, capillary   Collection Time: 06/22/19  6:06 PM  Result Value Ref Range   Glucose-Capillary 154 (H) 70 - 99 mg/dL  Glucose, capillary   Collection Time: 06/22/19  8:08 PM  Result Value Ref Range   Glucose-Capillary 166 (H) 70 - 99 mg/dL  Glucose, capillary   Collection Time: 06/23/19  8:14 AM  Result Value Ref Range   Glucose-Capillary 163 (H) 70 - 99 mg/dL   Comment 1 Notify RN    Comment 2 Document in Chart   Glucose, capillary   Collection Time: 06/23/19 11:02 AM  Result Value Ref Range   Glucose-Capillary 206 (H) 70 -  99 mg/dL   Comment 1 Notify RN    Comment 2 Document in Chart   Glucose, capillary   Collection Time: 06/23/19  1:24 PM  Result Value Ref Range   Glucose-Capillary 171 (H) 70 - 99 mg/dL  Glucose, capillary   Collection Time: 06/23/19  3:40 PM  Result Value Ref Range   Glucose-Capillary 179 (H) 70 - 99 mg/dL  Glucose, capillary   Collection Time: 06/23/19  6:26 PM  Result Value Ref Range   Glucose-Capillary 149 (H) 70 - 99 mg/dL   Comment 1 Notify RN  Comment 2 Document in Chart   Glucose, capillary   Collection Time: 06/23/19  8:27 PM  Result Value Ref Range   Glucose-Capillary 165 (H) 70 - 99 mg/dL  Type and screen [facility]   Collection Time: 06/24/19  5:40 AM  Result Value Ref Range   ABO/RH(D) O POS    Antibody Screen NEG    Sample Expiration      06/27/2019,2359 Performed at [facility], [address] [place], [place] [address]   Glucose, capillary   Collection Time: 06/24/19  8:38 AM  Result Value Ref Range   Glucose-Capillary 154 (H) 70 - 99 mg/dL  Glucose, capillary   Collection Time: 06/24/19 10:29 AM  Result Value Ref Range   Glucose-Capillary 182 (H) 70 - 99 mg/dL   Comment 1 Document in Chart   Glucose, capillary   Collection Time: 06/24/19  1:14 PM  Result Value Ref Range   Glucose-Capillary 133 (H) 70 - 99 mg/dL   Comment 1 Notify RN    Comment 2 Document in Chart    [place] Mfm Fetal Bpp Wo Non Stress  Result Date: 06/13/2019 ----------------------------------------------------------------------  OBSTETRICS REPORT                        (Signed Final 06/13/2019 11:55 am) ---------------------------------------------------------------------- Patient Info  ID #:       [id number]                          D.O.B.:  07-07-1991 (28 yrs)  Name:       Anna HitchBRANDI Antilla                 Visit Date: 06/12/2019 03:49 pm ---------------------------------------------------------------------- Performed By  Performed By:     Ellin SabaSusan M Kennedy         Ref. Address:      [address]                    [address]                                                              [address]                                                              [place], [place]                                                              [id number]  Attending:        Lin Landsmanorenthian Booker      [facility]                    MD  [facility]  Referred By:      Allie BossierMYRA C DOVE MD ---------------------------------------------------------------------- Orders   #  Description                          Code         Ordered By   1  US MFM OB FOLLOW UP                  96045.4076816.01     Lin LandsmanORENTHIAN                                                        BOOKER   2  US MFM FETAL BPP WO NON              76819.01     MYRA DOVE      STRESS  ----------------------------------------------------------------------   #  Order #                    Accession #                 Episode #   1  [id number]                  [id number]                  [id number]   2  [id number]                  [id number]  ---------------------------------------------------------------------- Indications   Encounter for other antenatal screening        Z36.2   follow-up   [redacted] weeks gestation of pregnancy                Z3A.32   Obesity complicating pregnancy, third          O99.213   trimester (pregravid BMI 50)   Poor obstetric history: Previous preterm       O09.219   delivery, antepartum  for pre-eclapsia   Poor obstetric history: Previous               O09.299   preeclampsia / eclampsia/gestational HTN   Pre-existing diabetes, type 1, in pregnancy,   O24.013   third trimester   Other mental disorder complicating             O99.340   pregnancy, third trimester , schizophrenia,   anxiety, depression.   Previously LGA.   Maternity 21 - normal  ---------------------------------------------------------------------- Vital Signs                                                  Height:        5'2" ---------------------------------------------------------------------- Fetal Evaluation  Num Of Fetuses:          1  Fetal Heart Rate(bpm):   150  Cardiac Activity:        Observed  Presentation:            Transverse, head to maternal right  Placenta:  Anterior  Amniotic Fluid  AFI FV:      Within normal limits  AFI Sum(cm)     %Tile       Largest Pocket(cm)  19.21           72          5.3  RUQ(cm)       RLQ(cm)       LUQ(cm)        LLQ(cm)  4.62          5.3           4.77           4.52 ---------------------------------------------------------------------- Biophysical Evaluation  Amniotic F.V:   Within normal limits       F. Tone:         Observed  F. Movement:    Observed                   Score:           8/8  F. Breathing:   Observed ---------------------------------------------------------------------- Biometry  BPD:      77.9  mm     G. Age:  31w 2d         15  %    CI:          70.7  %    70 - 86                                                          FL/HC:       20.8  %    19.1 - 21.3  HC:      295.3  mm     G. Age:  32w 4d         22  %    HC/AC:       0.99       0.96 - 1.17  AC:      296.9  mm     G. Age:  33w 5d         85  %    FL/BPD:      78.9  %    71 - 87  FL:       61.5  mm     G. Age:  31w 6d         27  %    FL/AC:       20.7  %    20 - 24  HUM:      54.6  mm     G. Age:  31w 5d         42  %  LV:        6.7  mm  Est. FW:    2060   gm     4 lb 9 oz     57  % ---------------------------------------------------------------------- OB History  Gravidity:    5         Term:   1        Prem:   1        SAB:   2  Living:       2 ---------------------------------------------------------------------- Gestational Age  LMP:           32w 2d  Date:  10/29/18                 EDD:   08/05/19  U/S Today:     32w 3d                                        EDD:   08/04/19  Best:          32w 2d     Det. By:  LMP  (10/29/18)          EDD:   08/05/19  ---------------------------------------------------------------------- Anatomy  Cranium:               Appears normal         Aortic Arch:            Previously seen  Cavum:                 Appears normal         Ductal Arch:            Not well visualized  Ventricles:            Appears normal         Diaphragm:              Appears normal  Choroid Plexus:        Appears normal         Stomach:                Appears normal, left                                                                        sided  Cerebellum:            Previously seen        Abdomen:                Appears normal  Posterior Fossa:       Previously seen        Abdominal Wall:         Appears nml (cord                                                                        insert, abd wall)  Nuchal Fold:           Not applicable (>20    Cord Vessels:           Appears normal ([redacted]                         wks GA)                                        vessel cord)  Face:  Appears normal         Kidneys:                Appear normal                         (orbits and profile)  Lips:                  Appears normal         Bladder:                Appears normal  Thoracic:              Appears normal         Spine:                  Previously seen  Heart:                 Appears normal         Upper Extremities:      Previously seen                         (4CH, axis, and                         situs)  RVOT:                  Previously seen        Lower Extremities:      Previously seen  LVOT:                  Previously seen  Other:  Heels and 5th digit previously seen. Technically difficult due to          maternal habitus. ---------------------------------------------------------------------- Cervix Uterus Adnexa  Cervix  Not visualized (advanced GA >24wks) ---------------------------------------------------------------------- Impression  Normal interval growth.  Biophysical profile 8/8  T1 DM  Low risk NIPS  ---------------------------------------------------------------------- Recommendations  Repeat growth in 4 weeks  Initiate weekly BPP ----------------------------------------------------------------------               Lin Landsman, MD Electronically Signed Final Report   06/13/2019 11:55 am ----------------------------------------------------------------------  [place] Mfm Ob Follow Up  Result Date: 06/13/2019 ----------------------------------------------------------------------  OBSTETRICS REPORT                        (Signed Final 06/13/2019 11:55 am) ---------------------------------------------------------------------- Patient Info  ID #:       [id number]                          D.O.B.:  Apr 11, 1991 (28 yrs)  Name:       Anna Jordan                 Visit Date: 06/12/2019 03:49 pm ---------------------------------------------------------------------- Performed By  Performed By:     Ellin Saba        Ref. Address:      [address]                    [address]  [place], [place]  Attending:        Sander Nephew      [facility]                    MD                                        Fetal Care  Referred By:      Emily Filbert MD ---------------------------------------------------------------------- Orders   #  Description                          Code         Ordered By   1  [place] MFM OB FOLLOW UP                  76816.01     Sander Nephew   2  [place] MFM FETAL BPP WO NON              76819.01     [place]  ----------------------------------------------------------------------   #  Order #                    Accession #                 Episode #   1  [id number]                  [id number]   2   [id number]                  [id number]                  [id number]  ---------------------------------------------------------------------- Indications   Encounter for other antenatal screening        Z36.2   follow-up   [redacted] weeks gestation of pregnancy                N2D.78   Obesity complicating pregnancy, third          O99.213   trimester (pregravid BMI 50)   Poor obstetric history: Previous preterm  O09.219   delivery, antepartum  for pre-eclapsia   Poor obstetric history: Previous               O09.299   preeclampsia / eclampsia/gestational HTN   Pre-existing diabetes, type 1, in pregnancy,   O24.013   third trimester   Other mental disorder complicating             O99.340   pregnancy, third trimester , schizophrenia,   anxiety, depression.   Previously LGA.   Maternity 21 - normal  ---------------------------------------------------------------------- Vital Signs                                                 Height:        5'2" ---------------------------------------------------------------------- Fetal Evaluation  Num Of Fetuses:          1  Fetal Heart Rate(bpm):   150  Cardiac Activity:        Observed  Presentation:            Transverse, head to maternal right  Placenta:                Anterior  Amniotic Fluid  AFI FV:      Within normal limits  AFI Sum(cm)     %Tile       Largest Pocket(cm)  19.21           72          5.3  RUQ(cm)       RLQ(cm)       LUQ(cm)        LLQ(cm)  4.62          5.3           4.77           4.52 ---------------------------------------------------------------------- Biophysical Evaluation  Amniotic F.V:   Within normal limits       F. Tone:         Observed  F. Movement:    Observed                   Score:           8/8  F. Breathing:   Observed ---------------------------------------------------------------------- Biometry  BPD:      77.9  mm     G. Age:  31w 2d         15  %    CI:          70.7  %    70 - 86                                                           FL/HC:       20.8  %    19.1 - 21.3  HC:      295.3  mm     G. Age:  32w 4d         22  %    HC/AC:       0.99       0.96 - 1.17  AC:      296.9  mm     G. Age:  33w  5d         85  %    FL/BPD:      78.9  %    71 - 87  FL:       61.5  mm     G. Age:  31w 6d         27  %    FL/AC:       20.7  %    20 - 24  HUM:      54.6  mm     G. Age:  31w 5d         42  %  LV:        6.7  mm  Est. FW:    2060   gm     4 lb 9 oz     57  % ---------------------------------------------------------------------- OB History  Gravidity:    5         Term:   1        Prem:   1        SAB:   2  Living:       2 ---------------------------------------------------------------------- Gestational Age  LMP:           32w 2d        Date:  10/29/18                 EDD:   08/05/19  U/S Today:     32w 3d                                        EDD:   08/04/19  Best:          32w 2d     Det. By:  LMP  (10/29/18)          EDD:   08/05/19 ---------------------------------------------------------------------- Anatomy  Cranium:               Appears normal         Aortic Arch:            Previously seen  Cavum:                 Appears normal         Ductal Arch:            Not well visualized  Ventricles:            Appears normal         Diaphragm:              Appears normal  Choroid Plexus:        Appears normal         Stomach:                Appears normal, left                                                                        sided  Cerebellum:            Previously seen        Abdomen:  Appears normal  Posterior Fossa:       Previously seen        Abdominal Wall:         Appears nml (cord                                                                        insert, abd wall)  Nuchal Fold:           Not applicable (>20    Cord Vessels:           Appears normal ([redacted]                         wks GA)                                        vessel cord)  Face:                  Appears normal         Kidneys:                Appear normal                          (orbits and profile)  Lips:                  Appears normal         Bladder:                Appears normal  Thoracic:              Appears normal         Spine:                  Previously seen  Heart:                 Appears normal         Upper Extremities:      Previously seen                         (4CH, axis, and                         situs)  RVOT:                  Previously seen        Lower Extremities:      Previously seen  LVOT:                  Previously seen  Other:  Heels and 5th digit previously seen. Technically difficult due to          maternal habitus. ---------------------------------------------------------------------- Cervix Uterus Adnexa  Cervix  Not visualized (advanced GA >24wks) ---------------------------------------------------------------------- Impression  Normal interval growth.  Biophysical profile 8/8  T1 DM  Low risk NIPS ---------------------------------------------------------------------- Recommendations  Repeat growth in 4 weeks  Initiate weekly BPP ----------------------------------------------------------------------  Lin Landsman, MD Electronically Signed Final Report   06/13/2019 11:55 am ----------------------------------------------------------------------   Future Appointments  Date Time Provider Department Center  06/27/2019  3:30 PM [facility] NURSE [facility]  07/01/2019  3:30 PM Lesly Dukes, MD [facility]  07/04/2019  3:30 PM [facility]URSE [facility]  07/08/2019  4:00 PM Allie Bossier, MD [facility]  07/11/2019  3:45 PM [facility] NURSE [facility]  07/11/2019  3:45 PM [facility] [place] 2 [facility]    Discharge Condition: Stable  Discharge disposition: 01-Home or Self Care       Discharge Instructions    Discharge activity:  No Restrictions   Complete by: As directed    Discharge diet:   Complete by: As directed    Diabetic Diet   Fetal Kick Count:  Lie on  our left side for one hour after a meal, and count the number of times your baby kicks.  If it is less than 5 times, get up, move around and drink some juice.  Repeat the test 30 minutes later.  If it is still less than 5 kicks in an hour, notify your doctor.   Complete by: As directed    No sexual activity restrictions   Complete by: As directed      Allergies as of 06/24/2019      Reactions   Hydrocodone Hives   Tramadol Hives      Medication List    STOP taking these medications   insulin regular 100 units/mL injection Commonly known as: NOVOLIN R   metFORMIN 500 MG tablet Commonly known as: Glucophage     TAKE these medications   Accu-Chek Aviva device Use as instructed   Accu-Chek FastClix Lancets Misc 1 Device by Percutaneous route 4 (four) times daily.   albuterol 108 (90 Base) MCG/ACT inhaler Commonly known as: VENTOLIN HFA Inhale 2 puffs into the lungs every 6 (six) hours as needed for wheezing or shortness of breath.   aspirin EC 81 MG tablet Take 1 tablet (81 mg total) by mouth daily. Take after 12 weeks for prevention of preeclampsia later in pregnancy   glucose blood test strip Use to check CBG's QID   insulin aspart 100 UNIT/ML injection Commonly known as: novoLOG Inject 40 Units into the skin 3 (three) times daily with meals.   insulin NPH Human 100 UNIT/ML injection Commonly known as: NOVOLIN N Inject 0.8 mLs (80 Units total) into the skin at bedtime. What changed:   how much to take  how to take this  when to take this  additional instructions   insulin NPH Human 100 UNIT/ML injection Commonly known as: NOVOLIN N Inject 0.55 mLs (55 Units total) into the skin daily before breakfast. Start taking on: June 25, 2019 What changed: You were already taking a medication with the same name, and this prescription was added. Make sure you understand how and when to take each.   Insulin Syringes (Disposable) U-100 1 ML Misc 1 Syringe by Does not  apply route 3 (three) times daily.   loratadine 10 MG tablet Commonly known as: CLARITIN Take 10 mg by mouth daily.   multivitamin-prenatal 27-0.8 MG Tabs tablet Take 1 tablet by mouth daily at 12 noon.   omeprazole 20 MG capsule Commonly known as: PriLOSEC Take 1 capsule (20 mg total) by mouth daily.   tiZANidine 4 MG tablet Commonly known as: ZANAFLEX Take 4 mg by mouth every 8 (eight) hours as needed for muscle spasms.  Signed: Jaynie Collins M.D. 06/24/2019, 3:04 PM

## 2019-06-24 NOTE — Discharge Instructions (Signed)
Type 1 or Type 2 Diabetes Mellitus During Pregnancy, Self Care Caring for yourself during your pregnancy when you have type 1 diabetes (type 1 diabetes mellitus) or type 2 diabetes (type 2 diabetes mellitus) means keeping your blood sugar (glucose) under control with a balance of:  Nutrition.  Exercise.  Lifestyle changes.  Insulin or medicines, if necessary.  Support from your team of health care providers and others. The following information explains what you need to know to manage your diabetes at home during your pregnancy. What are the risks? If diabetes is treated, it is unlikely to cause problems. If it is not controlled with treatment, it may cause problems during labor and delivery, and some of those problems can be harmful to the unborn baby (fetus) and the mother. Uncontrolled diabetes may also cause the newborn baby to have breathing problems and low blood glucose. Having diabetes can put you at risk for other long-term (chronic) conditions, such as heart disease and kidney disease. Your health care provider may prescribe medicines to help prevent complications from diabetes. These medicines may include:  Aspirin.  Medicine to lower cholesterol.  Medicine to control blood pressure. How to monitor blood glucose   Check your blood glucose every day, as often as told by your health care provider.  Contact your health care provider if your blood glucose is above your target for 2 tests in a row.  Have your A1c (hemoglobin A1c) level checked at least two times a year, or as often as told by your health care provider. Your health care provider will set personal treatment goals for you. Generally, the goal of treatment is to maintain the following blood glucose levels during pregnancy:  After not eating (after fasting) for 8 hours: at or below 95 mg/dL (5.3 mmol/L).  After meals (postprandial): ? One hour after a meal: at or below 140 mg/dL (7.8 mmol/L). ? Two hours after a  meal: at or below 120 mg/dL (6.7 mmol/L).  A1c level: 6-6.5% How to manage hyperglycemia and hypoglycemia Hyperglycemia symptoms Hyperglycemia, also called high blood glucose, occurs when blood glucose is too high. Make sure you know the early signs of hyperglycemia, such as:  Increased thirst.  Hunger.  Feeling very tired.  Needing to urinate more often than usual.  Blurry vision. Hypoglycemia symptoms Hypoglycemia, also called low blood glucose, occurs with a blood glucose level at or below 70 mg/dL (3.9 mmol/L). The risk for hypoglycemia increases during or after exercise, during sleep, during illness, and when skipping meals or fasting. It is important to know the symptoms of hypoglycemia and treat it right away. Always have a 15-gram rapid-acting carbohydrate snack with you to treat low blood glucose. Family members and close friends should also know the symptoms and should understand how to treat hypoglycemia, in case you are not able to treat yourself. Symptoms may include:  Hunger.  Anxiety.  Sweating and feeling clammy.  Confusion.  Dizziness or feeling light-headed.  Sleepiness.  Nausea.  Increased heart rate.  Headache.  Blurry vision.  Irritability.  Tingling or numbness around the mouth, lips, or tongue.  A change in coordination.  Restless sleep.  Fainting.  Seizure. Treating hypoglycemia If you are alert and able to swallow safely, follow the 15:15 rule:  Take 15 grams of a rapid-acting carbohydrate. Rapid-acting options include: ? 1 tube of glucose gel. ? 3 glucose pills. ? 6-8 pieces of hard candy. ? 4 oz (120 mL) of fruit juice . ? 4 oz (120 mL)   of regular (not diet) soda.  Check your blood glucose 15 minutes after you take the carbohydrate.  If the repeat blood glucose level is still at or below 70 mg/dL (3.9 mmol/L), take 15 grams of a carbohydrate again.  If your blood glucose level does not increase above 70 mg/dL (3.9 mmol/L)  after 3 tries, seek emergency medical care.  After your blood glucose level returns to normal, eat a meal or a snack within 1 hour. Treating severe hypoglycemia Severe hypoglycemia is when your blood glucose level is at or below 54 mg/dL (3 mmol/L). Severe hypoglycemia is an emergency. Do not wait to see if the symptoms will go away. Get medical help right away. Call your local emergency services (911 in the [place] If you have severe hypoglycemia and you cannot eat or drink, you may need an injection of glucagon. A family member or close friend should learn how to check your blood glucose and how to give you a glucagon injection. Ask your health care provider if you need to have an emergency glucagon injection kit available. Severe hypoglycemia may need to be treated in a hospital. The treatment may include getting glucose through an IV. You may also need treatment for the cause of your hypoglycemia. Follow these instructions at home: Take diabetes medicines as told  If your health care provider prescribed insulin or diabetes medicines, take them every day.  Do not run out of insulin or other diabetes medicines that you take. Plan ahead so you always have these available.  If you use insulin, adjust your dosage based on how physically active you are and what foods you eat. Your health care provider will tell you how to adjust your dosage.  Your health care provider may recommend that you take one low-dose aspirin (81 mg) each day to help prevent high blood pressure during pregnancy (preeclampsia or eclampsia). You may be at risk for preeclampsia or eclampsia if: ? You had any of the following during a previous pregnancy:  Preeclampsia or eclampsia.  A fetal growth rate that was slower than normal.  An early (preterm) birth.  Separation of the placenta from the uterus (placental abruption).  Fetal loss. ? You are pregnant with more than one baby. ? You have other medical conditions, such  as high blood pressure or an autoimmune disease. Make healthy food choices  The things that you eat and drink affect your blood glucose and your insulin dosage. Making good choices helps to control your diabetes and prevent other health problems. A healthy meal plan includes eating lean proteins, complex carbohydrates, fresh fruits and vegetables, low-fat dairy products, and healthy fats. Make an appointment to see a diet and nutrition specialist (registered dietitian) to help you create an eating plan that is right for you. Make sure that you:  Follow instructions from your health care provider about eating or drinking restrictions.  Drink enough fluid to keep your urine pale yellow.  Eat healthy snacks between nutritious meals.  Keep a record of the carbohydrates that you eat. Do this by reading food labels and learning the standard serving sizes of foods.  Follow your sick day plan whenever you cannot eat or drink as usual. Make this plan in advance with your health care provider.  Stay active  Do at least 30 minutes of physical activity a day, or as much physical activity as your health care provider recommends during your pregnancy.  If you start a new exercise or activity, work with your health   care provider to adjust your insulin, medicines, or food intake as needed. Make healthy lifestyle choices  Do not drink alcohol.  Do not use any tobacco products, such as cigarettes, chewing tobacco, and e-cigarettes. If you need help quitting, ask your health care provider.  Learn to manage stress. If you need help with this, ask your health care provider. Care for your body  Keep your immunizations up to date.  Schedule an eye exam during your first trimester of your pregnancy, or as told by your health care provider.  Check your skin and feet every day for cuts, bruises, redness, blisters, or sores. Schedule a foot exam with your health care provider once every year.  Brush your  teeth and gums two times a day, and floss one or more times a day. Visit your dentist one or more times every 6 months.  Maintain a healthy weight during your pregnancy. General instructions  Take over-the-counter and prescription medicines only as told by your health care provider.  Talk with your health care provider about your risk for high blood pressure during pregnancy (preeclampsia or eclampsia).  Share your diabetes management plan with people in your workplace, school, and household.  Check your urine ketones when you are ill and as told by your health care provider.  Carry a medical alert card or wear medical alert jewelry.  Keep all follow-up visits during your pregnancy (prenatal) and after delivery (postnatal) as told by your health care provider. This is important. Questions to ask your health care provider  Do I need to meet with a diabetes educator?  Where can I find a support group for people with diabetes? Where to find more information For more information about diabetes, visit:  American Diabetes Association (ADA): www.diabetes.org  American Association of Diabetes Educators (AADE): www.diabeteseducator.org Summary  Caring for yourself when you have type 1 or type 2 diabetes means keeping your blood sugar (glucose) under control. You can do that with a balance of insulin and other medicines, nutrition, exercise, and lifestyle changes.  Check your blood glucose every day, as often as told by your health care provider.  If your health care provider prescribed insulin or diabetes medicines, take them every day.  Keep all follow-up visits during your pregnancy (prenatal) and after delivery (postnatal) as told by your health care provider. This is important. This information is not intended to replace advice given to you by your health care provider. Make sure you discuss any questions you have with your health care provider. Document Released: 02/22/2016 Document  Revised: 12/29/2017 Document Reviewed: 12/04/2015 Elsevier Patient Education  2020 [employer]  

## 2019-06-25 ENCOUNTER — Other Ambulatory Visit: Payer: Medicaid Other

## 2019-06-27 ENCOUNTER — Ambulatory Visit (INDEPENDENT_AMBULATORY_CARE_PROVIDER_SITE_OTHER): Payer: Medicaid Other | Admitting: *Deleted

## 2019-06-27 ENCOUNTER — Other Ambulatory Visit (HOSPITAL_COMMUNITY)
Admission: RE | Admit: 2019-06-27 | Discharge: 2019-06-27 | Disposition: A | Discharge status: Home / Self Care | Payer: Medicaid Other | Source: Ambulatory Visit | Attending: Obstetrics & Gynecology | Admitting: Obstetrics & Gynecology

## 2019-06-27 ENCOUNTER — Other Ambulatory Visit: Payer: Self-pay

## 2019-06-27 ENCOUNTER — Ambulatory Visit (HOSPITAL_COMMUNITY): Payer: Medicaid Other

## 2019-06-27 ENCOUNTER — Telehealth (INDEPENDENT_AMBULATORY_CARE_PROVIDER_SITE_OTHER): Payer: Medicaid Other | Admitting: Clinical

## 2019-06-27 VITALS — BP 122/74 | HR 107 | Wt 269.0 lb

## 2019-06-27 DIAGNOSIS — Z3A35 35 weeks gestation of pregnancy: Secondary | ICD-10-CM | POA: Diagnosis not present

## 2019-06-27 DIAGNOSIS — O24419 Gestational diabetes mellitus in pregnancy, unspecified control: Secondary | ICD-10-CM

## 2019-06-27 DIAGNOSIS — O0993 Supervision of high risk pregnancy, unspecified, third trimester: Secondary | ICD-10-CM

## 2019-06-27 DIAGNOSIS — O24113 Pre-existing diabetes mellitus, type 2, in pregnancy, third trimester: Secondary | ICD-10-CM

## 2019-06-27 DIAGNOSIS — Z23 Encounter for immunization: Secondary | ICD-10-CM

## 2019-06-27 DIAGNOSIS — N898 Other specified noninflammatory disorders of vagina: Secondary | ICD-10-CM

## 2019-06-27 DIAGNOSIS — F411 Generalized anxiety disorder: Secondary | ICD-10-CM | POA: Diagnosis present

## 2019-06-27 NOTE — BH Specialist Note (Signed)
Integrated Behavioral Health via Telemedicine Video Visit  TELEPHONE VISIT   06/27/2019 Anna Jordan [id number]  Number of [place] visits: 2 Session Start time: 8:17  Session End time: 8:36 Total time: 20 minutes  Referring Provider: Unknown Jim, MD Type of Visit: Phone Patient/Family location: Home [facility] Provider location: [facility] All persons participating in visit: Patient Anna Jordan and [place]  Confirmed patient's address: Yes  Confirmed patient's phone number: Yes  Any changes to demographics: No   Confirmed patient's insurance: Yes  Any changes to patient's insurance: No   Discussed confidentiality: At previous visit  I connected with Julieana Ales  by a video enabled telemedicine application and verified that I am speaking with the correct person using two identifiers.     I discussed the limitations of evaluation and management by telemedicine and the availability of in person appointments.  I discussed that the purpose of this visit is to provide behavioral health care while limiting exposure to the novel coronavirus.   Discussed there is a possibility of technology failure and discussed alternative modes of communication if that failure occurs.  I discussed that engaging in this video visit, they consent to the provision of behavioral healthcare and the services will be billed under their insurance.  Patient and/or legal guardian expressed understanding and consented to video visit: Yes   PRESENTING CONCERNS: Patient and/or family reports the following symptoms/concerns: Pt states her anxiety symptoms have remained the same since last visit, after going to the hospital for issues with uncontrolled diabetes. Pt says she slept best on ambien in the hospital, but prefers non-medication during the remainder of pregnancy; open to learning and  implementing another self-coping strategy today.  Duration of problem: Increase in  pregnancy; ongoing since 28yo; Severity of problem: severe  STRENGTHS (Protective Factors/Coping Skills): Self-aware  GOALS ADDRESSED: Patient will: 1.  Reduce symptoms of: anxiety and depression  2.  Increase knowledge and/or ability of: coping skills and healthy habits  3.  Demonstrate ability to: Increase healthy adjustment to current life circumstances  INTERVENTIONS: Interventions utilized:  Mindfulness or Psychologist, educational and Psychoeducation and/or Health Education Standardized Assessments completed: Not Needed  ASSESSMENT: Patient currently experiencing Generalized anxiety disorder.   Patient may benefit from continued brief therapeutic intervention regarding coping with symptoms of anxiety and depression.  PLAN: 1. Follow up with behavioral health clinician on : As needed 2. Behavioral recommendations:  -Begin practicing CALM relaxation breathing exercise every evening after children are put to bed -Prioritize improved family sleep habits for the next 4 nights, to prepare for first day of virtual school -Continue taking prenatal vitamin daily, as recommended by medical provider -Continue using daily self-coping strategies that have helped in the past 3. Referral(s): [place] (In Clinic)  I discussed the assessment and treatment plan with the patient and/or parent/guardian. They were provided an opportunity to ask questions and all were answered. They agreed with the plan and demonstrated an understanding of the instructions.   They were advised to call back or seek an in-person evaluation if the symptoms worsen or if the condition fails to improve as anticipated.  Caroleen Hamman Brieann Osinski

## 2019-06-27 NOTE — Progress Notes (Signed)
Pt here for NST and AFI.  She is c/oing of severe vaginal itching so Aptima self swab was done.

## 2019-06-28 ENCOUNTER — Telehealth: Payer: Self-pay

## 2019-06-28 NOTE — Progress Notes (Unsigned)
Received blood sugar trigger from [employer] due to out of range blood sugars are as below. Patient will continue to monitor blood sugars at home which will be reviewed at next OB visit on 07/01/19.

## 2019-06-28 NOTE — Telephone Encounter (Signed)
Received notification from [employer] that pt had a critical value BS.  Pt belongs to [facility] and has an appt with them on 07/01/19.  [facility].       Mel Almond, RN 06/28/19

## 2019-06-29 ENCOUNTER — Inpatient Hospital Stay (HOSPITAL_COMMUNITY)
Admission: AD | Admit: 2019-06-29 | Discharge: 2019-06-30 | Disposition: A | Discharge status: Home / Self Care | Payer: Medicaid Other | Attending: Obstetrics and Gynecology | Admitting: Obstetrics and Gynecology

## 2019-06-29 ENCOUNTER — Other Ambulatory Visit: Payer: Self-pay

## 2019-06-29 DIAGNOSIS — R7309 Other abnormal glucose: Secondary | ICD-10-CM

## 2019-06-29 DIAGNOSIS — O24113 Pre-existing diabetes mellitus, type 2, in pregnancy, third trimester: Secondary | ICD-10-CM | POA: Diagnosis not present

## 2019-06-29 DIAGNOSIS — Z794 Long term (current) use of insulin: Secondary | ICD-10-CM | POA: Diagnosis not present

## 2019-06-29 DIAGNOSIS — O4703 False labor before 37 completed weeks of gestation, third trimester: Secondary | ICD-10-CM | POA: Diagnosis not present

## 2019-06-29 DIAGNOSIS — Z87891 Personal history of nicotine dependence: Secondary | ICD-10-CM | POA: Insufficient documentation

## 2019-06-29 DIAGNOSIS — E119 Type 2 diabetes mellitus without complications: Secondary | ICD-10-CM | POA: Diagnosis not present

## 2019-06-29 DIAGNOSIS — Z3A34 34 weeks gestation of pregnancy: Secondary | ICD-10-CM | POA: Diagnosis not present

## 2019-06-29 DIAGNOSIS — O479 False labor, unspecified: Secondary | ICD-10-CM

## 2019-06-29 DIAGNOSIS — Z3689 Encounter for other specified antenatal screening: Secondary | ICD-10-CM | POA: Diagnosis not present

## 2019-06-29 DIAGNOSIS — O4700 False labor before 37 completed weeks of gestation, unspecified trimester: Secondary | ICD-10-CM

## 2019-06-29 LAB — COMPREHENSIVE METABOLIC PANEL
ALT: 19 U/L (ref 0–44)
AST: 17 U/L (ref 15–41)
Albumin: 2.3 g/dL — ABNORMAL LOW (ref 3.5–5.0)
Alkaline Phosphatase: 93 U/L (ref 38–126)
Anion gap: 12 (ref 5–15)
BUN: 8 mg/dL (ref 6–20)
CO2: 17 mmol/L — ABNORMAL LOW (ref 22–32)
Calcium: 9 mg/dL (ref 8.9–10.3)
Chloride: 105 mmol/L (ref 98–111)
Creatinine, Ser: 0.61 mg/dL (ref 0.44–1.00)
GFR calc Af Amer: >60 mL/min (ref >60)
GFR calc non Af Amer: >60 mL/min (ref >60)
Glucose, Bld: 204 mg/dL — ABNORMAL HIGH (ref 70–99)
Potassium: 3.7 mmol/L (ref 3.5–5.1)
Sodium: 134 mmol/L — ABNORMAL LOW (ref 135–145)
Total Bilirubin: 0.5 mg/dL (ref 0.3–1.2)
Total Protein: 5.5 g/dL — ABNORMAL LOW (ref 6.5–8.1)

## 2019-06-29 LAB — TYPE AND SCREEN
ABO/RH(D): O POS
Antibody Screen: NEGATIVE

## 2019-06-29 LAB — CBC
HCT: 35.3 % — ABNORMAL LOW (ref 36.0–46.0)
Hemoglobin: 11.4 g/dL — ABNORMAL LOW (ref 12.0–15.0)
MCH: 28.3 pg (ref 26.0–34.0)
MCHC: 32.3 g/dL (ref 30.0–36.0)
MCV: 87.6 fL (ref 80.0–100.0)
Platelets: 185 10*3/uL (ref 150–400)
RBC: 4.03 MIL/uL (ref 3.87–5.11)
RDW: 15.6 % — ABNORMAL HIGH (ref 11.5–15.5)
WBC: 9.8 10*3/uL (ref 4.0–10.5)
nRBC: 0.2 % (ref 0.0–0.2)

## 2019-06-29 LAB — CERVICOVAGINAL ANCILLARY ONLY
Bacterial vaginitis: NEGATIVE
Candida vaginitis: NEGATIVE
Chlamydia: NEGATIVE
Neisseria Gonorrhea: NEGATIVE

## 2019-06-29 LAB — GLUCOSE, CAPILLARY: Glucose-Capillary: 210 mg/dL — ABNORMAL HIGH (ref 70–99)

## 2019-06-29 MED ORDER — LACTATED RINGERS IV SOLN
Freq: Once | INTRAVENOUS | Status: AC
  Administered 2019-06-29: 22:00:00 via INTRAVENOUS

## 2019-06-29 MED ORDER — LACTATED RINGERS IV SOLN
INTRAVENOUS | Status: DC
  Administered 2019-06-29: 21:00:00 via INTRAVENOUS

## 2019-06-29 MED ORDER — INSULIN ASPART 100 UNIT/ML ~~LOC~~ SOLN
2.0000 [IU] | Freq: Once | SUBCUTANEOUS | Status: AC
  Administered 2019-06-29: 2 [IU] via SUBCUTANEOUS

## 2019-06-29 NOTE — MAU Provider Note (Addendum)
Patient Anna Jordan is a 28 y.o. [id number] At 1w5dhere with complaints of contractions. She denies VB, vaginal discharge, HA, blurry vision.   She recently inpatient for uncontrolled DM2; discharged home on regimen.   She is currently taking:  40 units Novolog/aspart with meals.  She takes Humulin/NPH 80 units at bedtime She takes Humulin/NPH 55 units at breakfast.     History     CSN: [id number] Arrival date and time: 06/29/19 2054   None     Chief Complaint  Patient presents with  . Contractions   Abdominal Pain This is a new problem. The current episode started today. The problem occurs constantly. The pain is located in the generalized abdominal region. The pain is at a severity of 8/10. The quality of the pain is cramping. Pertinent negatives include no constipation, diarrhea, dysuria, nausea or vomiting.    OB History    Gravida  5   Para  2   Term  1   Preterm  1   AB  2   Living  2     SAB  2   TAB      Ectopic      Multiple      Live Births  2           Past Medical History:  Diagnosis Date  . Anxiety   . Class 3 severe obesity due to excess calories with body mass index (BMI) of 50.0 to 59.9 in adult ([facility] 07/18/2018  . Depression   . Gestational diabetes   . Hypertension   . Obesity   . Schizophrenia ([facility]     Past Surgical History:  Procedure Laterality Date  . GALLBLADDER SURGERY  08/2018  . WISDOM TOOTH EXTRACTION      Family History  Problem Relation Age of Onset  . Depression Mother   . Diabetes Mother   . Heart disease Mother   . Stroke Father   . Hypertension Father   . Breast cancer Maternal Aunt        4 mat aunts with breast cancer  . Diabetes Brother   . Breast cancer Maternal Grandmother   . Breast cancer Paternal Grandmother   . Breast cancer Paternal Grandfather     Social History   Tobacco Use  . Smoking status: Former Smoker    Packs/day: 0.50    Years: 15.00    Pack years: 7.50    Types:  Cigarettes    Quit date: 03/31/2017    Years since quitting: 2.2  . Smokeless tobacco: Never Used  . Tobacco comment: Quit previously x 1 year, Prior to quittting this time, smoked 0.5-1ppd  Substance Use Topics  . Alcohol use: No  . Drug use: No    Allergies:  Allergies  Allergen Reactions  . Hydrocodone Hives  . Tramadol Hives    Facility-Administered Medications Prior to Admission  Medication Dose Route Frequency Provider Last Rate Last Dose  . insulin starter kit- syringes (English) 1 kit  1 kit Other Once DHerron MWilhemina Cash MD       Medications Prior to Admission  Medication Sig Dispense Refill Last Dose  . Accu-Chek FastClix Lancets MISC 1 Device by Percutaneous route 4 (four) times daily. 100 each 12   . albuterol (PROVENTIL HFA;VENTOLIN HFA) 108 (90 Base) MCG/ACT inhaler Inhale 2 puffs into the lungs every 6 (six) hours as needed for wheezing or shortness of breath.     .Marland Kitchenaspirin EC 81 MG tablet Take  1 tablet (81 mg total) by mouth daily. Take after 12 weeks for prevention of preeclampsia later in pregnancy 300 tablet 0   . Blood Glucose Monitoring Suppl (ACCU-CHEK AVIVA) device Use as instructed 1 each 0   . glucose blood test strip Use to check CBG's QID 100 each 12   . insulin aspart (NOVOLOG) 100 UNIT/ML injection Inject 40 Units into the skin 3 (three) times daily with meals. 10 mL 11   . insulin NPH Human (NOVOLIN N) 100 UNIT/ML injection Inject 0.8 mLs (80 Units total) into the skin at bedtime. 10 mL 11   . insulin NPH Human (NOVOLIN N) 100 UNIT/ML injection Inject 0.55 mLs (55 Units total) into the skin daily before breakfast. 10 mL 11   . Insulin Syringes, Disposable, U-100 1 ML MISC 1 Syringe by Does not apply route 3 (three) times daily. 100 each 12   . loratadine (CLARITIN) 10 MG tablet Take 10 mg by mouth daily.     Marland Kitchen omeprazole (PRILOSEC) 20 MG capsule Take 1 capsule (20 mg total) by mouth daily. 30 capsule 4   . Prenatal Vit-Fe Fumarate-FA (MULTIVITAMIN-PRENATAL)  27-0.8 MG TABS tablet Take 1 tablet by mouth daily at 12 noon. 30 tablet 10   . tiZANidine (ZANAFLEX) 4 MG tablet Take 4 mg by mouth every 8 (eight) hours as needed for muscle spasms.  1     Review of Systems  Gastrointestinal: Positive for abdominal pain. Negative for constipation, diarrhea, nausea and vomiting.  Genitourinary: Negative for dysuria.   Physical Exam   Blood pressure 109/63, pulse (!) 110, temperature 98.2 F (36.8 C), temperature source Oral, resp. rate 18, last menstrual period 10/29/2018, SpO2 100 %, unknown if currently breastfeeding.  Patient Vitals for the past 24 hrs:  BP Temp Temp src Pulse Resp SpO2  06/30/19 0016 109/63 - - (!) 110 - -  06/30/19 0001 100/60 - - (!) 115 - -  06/29/19 2332 (!) 90/59 - - 96 - -  06/29/19 2317 (!) 93/47 - - (!) 101 - -  06/29/19 2302 121/80 - - (!) 103 - -  06/29/19 2246 (!) 132/98 - - 83 - -  06/29/19 2223 131/68 - - (!) 108 - -  06/29/19 2212 (!) 154/62 - - (!) 103 - -  06/29/19 2208 (!) 154/62 98.2 F (36.8 C) Oral 86 18 100 %   Physical Exam  Constitutional: She is oriented to person, place, and time. She appears well-developed.  HENT:  Head: Normocephalic.  Neck: Normal range of motion.  Respiratory: Effort normal.  GI: Soft.  Genitourinary:    Genitourinary Comments: Cervix is closed, posterior, thick-feeling.    Musculoskeletal: Normal range of motion.  Neurological: She is alert and oriented to person, place, and time.  Skin: Skin is warm and dry.   Results for orders placed or performed during the hospital encounter of 06/29/19 (from the past 24 hour(s))  Glucose, capillary     Status: Abnormal   Collection Time: 06/29/19  9:11 PM  Result Value Ref Range   Glucose-Capillary 210 (H) 70 - 99 mg/dL  Type and screen [place]     Status: None   Collection Time: 06/29/19  9:31 PM  Result Value Ref Range   ABO/RH(D) O POS    Antibody Screen NEG    Sample Expiration       07/02/2019,2359 Performed at [facility], [place] [address] [place], [place] [address]   CBC  Status: Abnormal   Collection Time: 06/29/19  9:51 PM  Result Value Ref Range   WBC 9.8 4.0 - 10.5 K/uL   RBC 4.03 3.87 - 5.11 MIL/uL   Hemoglobin 11.4 (L) 12.0 - 15.0 g/dL   HCT 35.3 (L) 36.0 - 46.0 %   MCV 87.6 80.0 - 100.0 fL   MCH 28.3 26.0 - 34.0 pg   MCHC 32.3 30.0 - 36.0 g/dL   RDW 15.6 (H) 11.5 - 15.5 %   Platelets 185 150 - 400 K/uL   nRBC 0.2 0.0 - 0.2 %  Comprehensive metabolic panel     Status: Abnormal   Collection Time: 06/29/19  9:51 PM  Result Value Ref Range   Sodium 134 (L) 135 - 145 mmol/L   Potassium 3.7 3.5 - 5.1 mmol/L   Chloride 105 98 - 111 mmol/L   CO2 17 (L) 22 - 32 mmol/L   Glucose, Bld 204 (H) 70 - 99 mg/dL   BUN 8 6 - 20 mg/dL   Creatinine, Ser 0.61 0.44 - 1.00 mg/dL   Calcium 9.0 8.9 - 10.3 mg/dL   Total Protein 5.5 (L) 6.5 - 8.1 g/dL   Albumin 2.3 (L) 3.5 - 5.0 g/dL   AST 17 15 - 41 U/L   ALT 19 0 - 44 U/L   Alkaline Phosphatase 93 38 - 126 U/L   Total Bilirubin 0.5 0.3 - 1.2 mg/dL   GFR calc non Af Amer >60 >60 mL/min   GFR calc Af Amer >60 >60 mL/min   Anion gap 12 5 - 15  Protein / creatinine ratio, urine     Status: Abnormal   Collection Time: 06/29/19 10:30 PM  Result Value Ref Range   Creatinine, Urine 133.85 mg/dL   Total Protein, Urine 21 mg/dL   Protein Creatinine Ratio 0.16 (H) 0.00 - 0.15 mg/mg[Cre]  Glucose, capillary     Status: Abnormal   Collection Time: 06/30/19 12:03 AM  Result Value Ref Range   Glucose-Capillary 157 (H) 70 - 99 mg/dL   [place] Mfm Fetal Bpp Wo Non Stress  Result Date: 06/13/2019 ----------------------------------------------------------------------  OBSTETRICS REPORT                        (Signed Final 06/13/2019 11:55 am) ---------------------------------------------------------------------- Patient Info  ID #:       [id number]                          D.O.B.:  Dec 03, 1990 (28 yrs)  Name:       ALBIRTHA GRINAGE                  Visit Date: 06/12/2019 03:49 pm ---------------------------------------------------------------------- Performed By  Performed By:     Felecia Jan        Ref. Address:      [address]  [place], [place]  Attending:        Sander Nephew      [facility]                    MD                                        Fetal Care  Referred By:      Emily Filbert MD ---------------------------------------------------------------------- Orders   #  Description                          Code         Ordered By   1  [place] MFM OB FOLLOW UP                  76816.01     Sander Nephew   2  [place] MFM FETAL BPP WO NON              76819.01     [place]  ----------------------------------------------------------------------   #  Order #                    Accession #                 Episode #   1  [id number]                  [id number]   2  [id number]                  [id number]                  [id number]  ---------------------------------------------------------------------- Indications   Encounter for other antenatal screening        Z36.2   follow-up   [redacted] weeks gestation of pregnancy                Q3F.35   Obesity complicating pregnancy, third          O99.213   trimester (pregravid BMI 50)   Poor obstetric history: Previous preterm       O09.219   delivery, antepartum  for pre-eclapsia   Poor obstetric history: Previous               O09.299   preeclampsia / eclampsia/gestational HTN   Pre-existing diabetes, type 1, in pregnancy,   O24.013   third trimester   Other mental disorder complicating  O99.340   pregnancy, third trimester , schizophrenia,   anxiety,  depression.   Previously LGA.   Maternity 21 - normal  ---------------------------------------------------------------------- Vital Signs                                                 Height:        5'2" ---------------------------------------------------------------------- Fetal Evaluation  Num Of Fetuses:          1  Fetal Heart Rate(bpm):   150  Cardiac Activity:        Observed  Presentation:            Transverse, head to maternal right  Placenta:                Anterior  Amniotic Fluid  AFI FV:      Within normal limits  AFI Sum(cm)     %Tile       Largest Pocket(cm)  19.21           72          5.3  RUQ(cm)       RLQ(cm)       LUQ(cm)        LLQ(cm)  4.62          5.3           4.77           4.52 ---------------------------------------------------------------------- Biophysical Evaluation  Amniotic F.V:   Within normal limits       F. Tone:         Observed  F. Movement:    Observed                   Score:           8/8  F. Breathing:   Observed ---------------------------------------------------------------------- Biometry  BPD:      77.9  mm     G. Age:  31w 2d         15  %    CI:          70.7  %    70 - 86                                                          FL/HC:       20.8  %    19.1 - 21.3  HC:      295.3  mm     G. Age:  32w 4d         22  %    HC/AC:       0.99       0.96 - 1.17  AC:      296.9  mm     G. Age:  33w 5d         85  %    FL/BPD:      78.9  %    71 - 87  FL:       61.5  mm     G. Age:  31w 6d         27  %    FL/AC:  20.7  %    20 - 24  HUM:      54.6  mm     G. Age:  31w 5d         42  %  LV:        6.7  mm  Est. FW:    2060   gm     4 lb 9 oz     57  % ---------------------------------------------------------------------- OB History  Gravidity:    5         Term:   1        Prem:   1        SAB:   2  Living:       2 ---------------------------------------------------------------------- Gestational Age  LMP:           32w 2d        Date:  10/29/18                 EDD:    08/05/19  U/S Today:     32w 3d                                        EDD:   08/04/19  Best:          32w 2d     Det. By:  LMP  (10/29/18)          EDD:   08/05/19 ---------------------------------------------------------------------- Anatomy  Cranium:               Appears normal         Aortic Arch:            Previously seen  Cavum:                 Appears normal         Ductal Arch:            Not well visualized  Ventricles:            Appears normal         Diaphragm:              Appears normal  Choroid Plexus:        Appears normal         Stomach:                Appears normal, left                                                                        sided  Cerebellum:            Previously seen        Abdomen:                Appears normal  Posterior Fossa:       Previously seen        Abdominal Wall:         Appears nml (cord  insert, abd wall)  Nuchal Fold:           Not applicable (>31    Cord Vessels:           Appears normal ([redacted]                         wks GA)                                        vessel cord)  Face:                  Appears normal         Kidneys:                Appear normal                         (orbits and profile)  Lips:                  Appears normal         Bladder:                Appears normal  Thoracic:              Appears normal         Spine:                  Previously seen  Heart:                 Appears normal         Upper Extremities:      Previously seen                         (4CH, axis, and                         situs)  RVOT:                  Previously seen        Lower Extremities:      Previously seen  LVOT:                  Previously seen  Other:  Heels and 5th digit previously seen. Technically difficult due to          maternal habitus. ---------------------------------------------------------------------- Cervix Uterus Adnexa  Cervix  Not visualized (advanced GA >24wks)  ---------------------------------------------------------------------- Impression  Normal interval growth.  Biophysical profile 8/8  T1 DM  Low risk NIPS ---------------------------------------------------------------------- Recommendations  Repeat growth in 4 weeks  Initiate weekly BPP ----------------------------------------------------------------------               Sander Nephew, MD Electronically Signed Final Report   06/13/2019 11:55 am ----------------------------------------------------------------------  [place] Mfm Ob Follow Up  Result Date: 06/13/2019 ----------------------------------------------------------------------  OBSTETRICS REPORT                        (Signed Final 06/13/2019 11:55 am) ---------------------------------------------------------------------- Patient Info  ID #:       [id number]                          D.O.B.:  13-Dec-1990 (28 yrs)  Name:  [facility]                 Visit Date: 06/12/2019 03:49 pm ---------------------------------------------------------------------- Performed By  Performed By:     Felecia Jan        Ref. Address:      [address]                    [place], [place]  Attending:        Sander Nephew      Location[facility]                    MD                                        Fetal Care  Referred By:      Emily Filbert MD ---------------------------------------------------------------------- Orders   #  Description                          Code         Ordered By   1  [place] MFM OB FOLLOW UP                  34193.79     Sander Nephew   2  [place] MFM FETAL BPP WO NON              02409.73     [place]   ----------------------------------------------------------------------   #  Order #                    Accession #                 Episode #   1  [id number]                  [id number]  [id number]   2  [id number]                  [id number]                  [id number]  ---------------------------------------------------------------------- Indications   Encounter for other antenatal screening        Z36.2   follow-up   [redacted] weeks gestation of pregnancy                W2H.85   Obesity complicating pregnancy, third          O99.213   trimester (pregravid BMI 50)   Poor obstetric history: Previous preterm       O09.219   delivery, antepartum  for pre-eclapsia   Poor obstetric history: Previous               O09.299   preeclampsia / eclampsia/gestational HTN   Pre-existing diabetes, type 1, in pregnancy,   O24.013   third trimester   Other mental disorder complicating             O99.340   pregnancy, third trimester , schizophrenia,   anxiety, depression.   Previously LGA.   Maternity 21 - normal  ---------------------------------------------------------------------- Vital Signs                                                 Height:        5'2" ---------------------------------------------------------------------- Fetal Evaluation  Num Of Fetuses:          1  Fetal Heart Rate(bpm):   150  Cardiac Activity:        Observed  Presentation:            Transverse, head to maternal right  Placenta:                Anterior  Amniotic Fluid  AFI FV:      Within normal limits  AFI Sum(cm)     %Tile       Largest Pocket(cm)  19.21           72          5.3  RUQ(cm)       RLQ(cm)       LUQ(cm)        LLQ(cm)  4.62          5.3           4.77           4.52 ---------------------------------------------------------------------- Biophysical Evaluation  Amniotic F.V:   Within normal limits       F. Tone:         Observed  F. Movement:    Observed                   Score:           8/8  F. Breathing:   Observed  ---------------------------------------------------------------------- Biometry  BPD:      77.9  mm     G. Age:  31w 2d         15  %    CI:          70.7  %    70 - 86  FL/HC:       20.8  %    19.1 - 21.3  HC:      295.3  mm     G. Age:  32w 4d         22  %    HC/AC:       0.99       0.96 - 1.17  AC:      296.9  mm     G. Age:  33w 5d         85  %    FL/BPD:      78.9  %    71 - 87  FL:       61.5  mm     G. Age:  31w 6d         27  %    FL/AC:       20.7  %    20 - 24  HUM:      54.6  mm     G. Age:  31w 5d         42  %  LV:        6.7  mm  Est. FW:    2060   gm     4 lb 9 oz     57  % ---------------------------------------------------------------------- OB History  Gravidity:    5         Term:   1        Prem:   1        SAB:   2  Living:       2 ---------------------------------------------------------------------- Gestational Age  LMP:           32w 2d        Date:  10/29/18                 EDD:   08/05/19  U/S Today:     32w 3d                                        EDD:   08/04/19  Best:          32w 2d     Det. By:  LMP  (10/29/18)          EDD:   08/05/19 ---------------------------------------------------------------------- Anatomy  Cranium:               Appears normal         Aortic Arch:            Previously seen  Cavum:                 Appears normal         Ductal Arch:            Not well visualized  Ventricles:            Appears normal         Diaphragm:              Appears normal  Choroid Plexus:        Appears normal         Stomach:                Appears normal, left  sided  Cerebellum:            Previously seen        Abdomen:                Appears normal  Posterior Fossa:       Previously seen        Abdominal Wall:         Appears nml (cord                                                                        insert, abd wall)  Nuchal Fold:           Not  applicable (>11    Cord Vessels:           Appears normal ([redacted]                         wks GA)                                        vessel cord)  Face:                  Appears normal         Kidneys:                Appear normal                         (orbits and profile)  Lips:                  Appears normal         Bladder:                Appears normal  Thoracic:              Appears normal         Spine:                  Previously seen  Heart:                 Appears normal         Upper Extremities:      Previously seen                         (4CH, axis, and                         situs)  RVOT:                  Previously seen        Lower Extremities:      Previously seen  LVOT:                  Previously seen  Other:  Heels and 5th digit previously seen. Technically difficult due to          maternal habitus. ---------------------------------------------------------------------- Cervix Uterus Adnexa  Cervix  Not visualized (advanced GA >24wks) ---------------------------------------------------------------------- Impression  Normal interval growth.  Biophysical profile 8/8  T1 DM  Low risk NIPS ---------------------------------------------------------------------- Recommendations  Repeat growth in 4 weeks  Initiate weekly BPP ----------------------------------------------------------------------               Sander Nephew, MD Electronically Signed Final Report   06/13/2019 11:55 am ----------------------------------------------------------------------  MAU Course  Procedures  MDM -will draw CMP, CBC, start IV -blood glucose is 210 -Dr. Elly Modena made aware of patient's blood glucose level.  -NST: baseline is 140 bpm, mod var, present acel, neg decels, contractions q 6 minutes -will recheck cervix in 1 hour and blood sugar in 2 hours.  -cervix is still. 0.5 on recheck external os and internal os is closed.  -patient had elevated BP while in MAU; will order PCR to go with pre-e labs.    Patient care endorsed to Vernice Jefferson at 2225  -care assumed -CBC: WNL for pregnancy, platelets 185 (were 213 9days ago, 267 57month ago) -CMP: no abnormalities requiring treatment, AST/ALT 17/19 -PCr: 0.16 -glucose recheck: 157 -rare, irregular ctx on monitor, 1L LR given pt reports ctx are not present now -called and spoke with Dr. CElly Modena'@[id number]' , pt OK to be discharged home and follow prescribed T2DM regimen -pt discharged to home in stable condition  Orders Placed This Encounter  Procedures  . Glucose, capillary    Standing Status:   Standing    Number of Occurrences:   1  . CBC    Standing Status:   Standing    Number of Occurrences:   1  . Comprehensive metabolic panel    Standing Status:   Standing    Number of Occurrences:   1  . Protein / creatinine ratio, urine    Standing Status:   Standing    Number of Occurrences:   1  . Glucose, capillary    Standing Status:   Standing    Number of Occurrences:   1  . Type and screen [place]    Standing Status:   Standing    Number of Occurrences:   1  . Discharge patient    Order Specific Question:   Discharge disposition    Answer:   01-Home or Self Care [1]    Order Specific Question:   Discharge patient date    Answer:   06/30/2019   Meds ordered this encounter  Medications  . DISCONTD: lactated ringers infusion  . insulin aspart (novoLOG) injection 2 Units  . lactated ringers infusion   Assessment and Plan   1. Preterm contractions   2. [redacted] weeks gestation of pregnancy   3. NST (non-stress test) reactive   4. Elevated random blood glucose level   5. Type 2 diabetes mellitus affecting pregnancy in third trimester, antepartum    Allergies as of 06/30/2019      Reactions   Hydrocodone Hives   Tramadol Hives      Medication List    TAKE these medications   Accu-Chek Aviva device Use as instructed   Accu-Chek FastClix Lancets Misc 1 Device by  Percutaneous route 4 (four) times daily.   albuterol 108 (90 Base) MCG/ACT inhaler Commonly known as: VENTOLIN HFA Inhale 2 puffs into the lungs every 6 (six) hours as needed for wheezing or shortness of breath.   aspirin EC 81 MG tablet Take 1 tablet (81 mg total) by mouth daily. Take after 12 weeks for prevention of preeclampsia later in pregnancy  glucose blood test strip Use to check CBG's QID   insulin aspart 100 UNIT/ML injection Commonly known as: novoLOG Inject 40 Units into the skin 3 (three) times daily with meals.   insulin NPH Human 100 UNIT/ML injection Commonly known as: NOVOLIN N Inject 0.8 mLs (80 Units total) into the skin at bedtime.   insulin NPH Human 100 UNIT/ML injection Commonly known as: NOVOLIN N Inject 0.55 mLs (55 Units total) into the skin daily before breakfast.   Insulin Syringes (Disposable) U-100 1 ML Misc 1 Syringe by Does not apply route 3 (three) times daily.   loratadine 10 MG tablet Commonly known as: CLARITIN Take 10 mg by mouth daily.   multivitamin-prenatal 27-0.8 MG Tabs tablet Take 1 tablet by mouth daily at 12 noon.   omeprazole 20 MG capsule Commonly known as: PriLOSEC Take 1 capsule (20 mg total) by mouth daily.   tiZANidine 4 MG tablet Commonly known as: ZANAFLEX Take 4 mg by mouth every 8 (eight) hours as needed for muscle spasms.      -discussed appropriate T2DM insulin regimen -strict preeclampsia/PTL/return MAU precautions given -pt to keep appt on Monday with OB -pt discharged to home in stable condition  Nugent, Gerrie Nordmann, NP  12:29 AM 06/30/2019

## 2019-06-29 NOTE — MAU Note (Signed)
Pt came in and felt like she had to have a BM and push. Assisted her to room. Provider at bedside checked cervix and it was closed and posterior. FHR 155. Pt stated her blood sugars have been high today.

## 2019-06-30 LAB — GLUCOSE, CAPILLARY: Glucose-Capillary: 157 mg/dL — ABNORMAL HIGH (ref 70–99)

## 2019-06-30 LAB — PROTEIN / CREATININE RATIO, URINE
Creatinine, Urine: 133.85 mg/dL
Protein Creatinine Ratio: 0.16 mg/mg{Cre} — ABNORMAL HIGH (ref 0.00–0.15)
Total Protein, Urine: 21 mg/dL

## 2019-06-30 NOTE — Discharge Instructions (Signed)
Abdominal Pain During Pregnancy  Abdominal pain is common during pregnancy, and has many possible causes. Some causes are more serious than others, and sometimes the cause is not known. Abdominal pain can be a sign that labor is starting. It can also be caused by normal growth and stretching of muscles and ligaments during pregnancy. Always tell your health care provider if you have any abdominal pain. Follow these instructions at home:  Do not have sex or put anything in your vagina until your pain goes away completely.  Get plenty of rest until your pain improves.  Drink enough fluid to keep your urine pale yellow.  Take over-the-counter and prescription medicines only as told by your health care provider.  Keep all follow-up visits as told by your health care provider. This is important. Contact a health care provider if:  Your pain continues or gets worse after resting.  You have lower abdominal pain that: ? Comes and goes at regular intervals. ? Spreads to your back. ? Is similar to menstrual cramps.  You have pain or burning when you urinate. Get help right away if:  You have a fever or chills.  You have vaginal bleeding.  You are leaking fluid from your vagina.  You are passing tissue from your vagina.  You have vomiting or diarrhea that lasts for more than 24 hours.  Your baby is moving less than usual.  You feel very weak or faint.  You have shortness of breath.  You develop severe pain in your upper abdomen. Summary  Abdominal pain is common during pregnancy, and has many possible causes.  If you experience abdominal pain during pregnancy, tell your health care provider right away.  Follow your health care provider's home care instructions and keep all follow-up visits as directed. This information is not intended to replace advice given to you by your health care provider. Make sure you discuss any questions you have with your health care  provider. Document Released: 10/31/2005 Document Revised: 02/18/2019 Document Reviewed: 02/02/2017 Elsevier Patient Education  2020 [employer] Inc[employer]  Preeclampsia and Eclampsia Preeclampsia is a serious condition that may develop during pregnancy. This condition causes high blood pressure and increased protein in your urine along with other symptoms, such as headaches and vision changes. These symptoms may develop as the condition gets worse. Preeclampsia may occur at 20 weeks of pregnancy or later. Diagnosing and treating preeclampsia early is very important. If not treated early, it can cause serious problems for you and your baby. One problem it can lead to is eclampsia. Eclampsia is a condition that causes muscle jerking or shaking (convulsions or seizures) and other serious problems for the mother. During pregnancy, delivering your baby may be the best treatment for preeclampsia or eclampsia. For most women, preeclampsia and eclampsia symptoms go away after giving birth. In rare cases, a woman may develop preeclampsia after giving birth (postpartum preeclampsia). This usually occurs within 48 hours after childbirth but may occur up to 6 weeks after giving birth. What are the causes? The cause of preeclampsia is not known. What increases the risk? The following risk factors make you more likely to develop preeclampsia:  Being pregnant for the first time.  Having had preeclampsia during a past pregnancy.  Having a family history of preeclampsia.  Having high blood pressure.  Being pregnant with more than one baby.  Being 7435 or older.  Being African-American.  Having kidney disease or diabetes.  Having medical conditions such as lupus or blood diseases.  Being very overweight (obese). What are the signs or symptoms? The most common symptoms are:  Severe headaches.  Vision problems, such as blurred or double vision.  Abdominal pain, especially upper abdominal pain. Other  symptoms that may develop as the condition gets worse include:  Sudden weight gain.  Sudden swelling of the hands, face, legs, and feet.  Severe nausea and vomiting.  Numbness in the face, arms, legs, and feet.  Dizziness.  Urinating less than usual.  Slurred speech.  Convulsions or seizures. How is this diagnosed? There are no screening tests for preeclampsia. Your health care provider will ask you about symptoms and check for signs of preeclampsia during your prenatal visits. You may also have tests that include:  Checking your blood pressure.  Urine tests to check for protein. Your health care provider will check for this at every prenatal visit.  Blood tests.  Monitoring your baby's heart rate.  Ultrasound. How is this treated? You and your health care provider will determine the treatment approach that is best for you. Treatment may include:  Having more frequent prenatal exams to check for signs of preeclampsia, if you have an increased risk for preeclampsia.  Medicine to lower your blood pressure.  Staying in the hospital, if your condition is severe. There, treatment will focus on controlling your blood pressure and the amount of fluids in your body (fluid retention).  Taking medicine (magnesium sulfate) to prevent seizures. This may be given as an injection or through an IV.  Taking a low-dose aspirin during your pregnancy.  Delivering your baby early. You may have your labor started with medicine (induced), or you may have a cesarean delivery. Follow these instructions at home: Eating and drinking   Drink enough fluid to keep your urine pale yellow.  Avoid caffeine. Lifestyle  Do not use any products that contain nicotine or tobacco, such as cigarettes and e-cigarettes. If you need help quitting, ask your health care provider.  Do not use alcohol or drugs.  Avoid stress as much as possible. Rest and get plenty of sleep. General instructions  Take  over-the-counter and prescription medicines only as told by your health care provider.  When lying down, lie on your left side. This keeps pressure off your major blood vessels.  When sitting or lying down, raise (elevate) your feet. Try putting some pillows underneath your lower legs.  Exercise regularly. Ask your health care provider what kinds of exercise are best for you.  Keep all follow-up and prenatal visits as told by your health care provider. This is important. How is this prevented? There is no known way of preventing preeclampsia or eclampsia from developing. However, to lower your risk of complications and detect problems early:  Get regular prenatal care. Your health care provider may be able to diagnose and treat the condition early.  Maintain a healthy weight. Ask your health care provider for help managing weight gain during pregnancy.  Work with your health care provider to manage any long-term (chronic) health conditions you have, such as diabetes or kidney problems.  You may have tests of your blood pressure and kidney function after giving birth.  Your health care provider may have you take low-dose aspirin during your next pregnancy. Contact a health care provider if:  You have symptoms that your health care provider told you may require more treatment or monitoring, such as: ? Headaches. ? Nausea or vomiting. ? Abdominal pain. ? Dizziness. ? Light-headedness. Get help right away if:  You have severe: ? Abdominal pain. ? Headaches that do not get better. ? Dizziness. ? Vision problems. ? Confusion. ? Nausea or vomiting.  You have any of the following: ? A seizure. ? Sudden, rapid weight gain. ? Sudden swelling in your hands, ankles, or face. ? Trouble moving any part of your body. ? Numbness in any part of your body. ? Trouble speaking. ? Abnormal bleeding.  You faint. Summary  Preeclampsia is a serious condition that may develop during  pregnancy.  This condition causes high blood pressure and increased protein in your urine along with other symptoms, such as headaches and vision changes.  Diagnosing and treating preeclampsia early is very important. If not treated early, it can cause serious problems for you and your baby.  Get help right away if you have symptoms that your health care provider told you to watch for. This information is not intended to replace advice given to you by your health care provider. Make sure you discuss any questions you have with your health care provider. Document Released: 10/28/2000 Document Revised: 07/03/2018 Document Reviewed: 06/06/2016 Elsevier Patient Education  2020 [employer]  Preterm Labor and Birth Information  The normal length of a pregnancy is 39-41 weeks. Preterm labor is when labor starts before 37 completed weeks of pregnancy. What are the risk factors for preterm labor? Preterm labor is more likely to occur in women who:  Have certain infections during pregnancy such as a bladder infection, sexually transmitted infection, or infection inside the uterus (chorioamnionitis).  Have a shorter-than-normal cervix.  Have gone into preterm labor before.  Have had surgery on their cervix.  Are younger than age 28 or older than age 28.  Are African American.  Are pregnant with twins or multiple babies (multiple gestation).  Take street drugs or smoke while pregnant.  Do not gain enough weight while pregnant.  Became pregnant shortly after having been pregnant. What are the symptoms of preterm labor? Symptoms of preterm labor include:  Cramps similar to those that can happen during a menstrual period. The cramps may happen with diarrhea.  Pain in the abdomen or lower back.  Regular uterine contractions that may feel like tightening of the abdomen.  A feeling of increased pressure in the pelvis.  Increased watery or bloody mucus discharge from the  vagina.  Water breaking (ruptured amniotic sac). Why is it important to recognize signs of preterm labor? It is important to recognize signs of preterm labor because babies who are born prematurely may not be fully developed. This can put them at an increased risk for:  Long-term (chronic) heart and lung problems.  Difficulty immediately after birth with regulating body systems, including blood sugar, body temperature, heart rate, and breathing rate.  Bleeding in the brain.  Cerebral palsy.  Learning difficulties.  Death. These risks are highest for babies who are born before 34 weeks of pregnancy. How is preterm labor treated? Treatment depends on the length of your pregnancy, your condition, and the health of your baby. It may involve:  Having a stitch (suture) placed in your cervix to prevent your cervix from opening too early (cerclage).  Taking or being given medicines, such as: ? Hormone medicines. These may be given early in pregnancy to help support the pregnancy. ? Medicine to stop contractions. ? Medicines to help mature the babys lungs. These may be prescribed if the risk of delivery is high. ? Medicines to prevent your baby from developing cerebral palsy. If the labor  happens before 34 weeks of pregnancy, you may need to stay in the hospital. What should I do if I think I am in preterm labor? If you think that you are going into preterm labor, call your health care provider right away. How can I prevent preterm labor in future pregnancies? To increase your chance of having a full-term pregnancy:  Do not use any tobacco products, such as cigarettes, chewing tobacco, and e-cigarettes. If you need help quitting, ask your health care provider.  Do not use street drugs or medicines that have not been prescribed to you during your pregnancy.  Talk with your health care provider before taking any herbal supplements, even if you have been taking them regularly.  Make sure  you gain a healthy amount of weight during your pregnancy.  Watch for infection. If you think that you might have an infection, get it checked right away.  Make sure to tell your health care provider if you have gone into preterm labor before. This information is not intended to replace advice given to you by your health care provider. Make sure you discuss any questions you have with your health care provider. Document Released: 01/21/2004 Document Revised: 02/22/2019 Document Reviewed: 03/23/2016 Elsevier Patient Education  2020 [employer].

## 2019-07-01 ENCOUNTER — Other Ambulatory Visit: Payer: Self-pay

## 2019-07-01 ENCOUNTER — Telehealth (HOSPITAL_COMMUNITY): Payer: Self-pay | Admitting: *Deleted

## 2019-07-01 ENCOUNTER — Ambulatory Visit (INDEPENDENT_AMBULATORY_CARE_PROVIDER_SITE_OTHER): Payer: Medicaid Other | Admitting: Obstetrics & Gynecology

## 2019-07-01 ENCOUNTER — Encounter: Payer: Self-pay | Admitting: *Deleted

## 2019-07-01 ENCOUNTER — Inpatient Hospital Stay (HOSPITAL_COMMUNITY)
Admission: AD | Admit: 2019-07-01 | Discharge: 2019-07-05 | DRG: 784 | Disposition: A | Discharge status: Home / Self Care | Payer: Medicaid Other | Attending: Family Medicine | Admitting: Family Medicine

## 2019-07-01 VITALS — BP 154/63 | HR 118 | Wt 262.0 lb

## 2019-07-01 DIAGNOSIS — O1490 Unspecified pre-eclampsia, unspecified trimester: Secondary | ICD-10-CM

## 2019-07-01 DIAGNOSIS — O1493 Unspecified pre-eclampsia, third trimester: Secondary | ICD-10-CM

## 2019-07-01 DIAGNOSIS — O24113 Pre-existing diabetes mellitus, type 2, in pregnancy, third trimester: Secondary | ICD-10-CM

## 2019-07-01 DIAGNOSIS — O1414 Severe pre-eclampsia complicating childbirth: Principal | ICD-10-CM | POA: Diagnosis present

## 2019-07-01 DIAGNOSIS — O163 Unspecified maternal hypertension, third trimester: Secondary | ICD-10-CM

## 2019-07-01 DIAGNOSIS — Z3A35 35 weeks gestation of pregnancy: Secondary | ICD-10-CM

## 2019-07-01 DIAGNOSIS — Z20828 Contact with and (suspected) exposure to other viral communicable diseases: Secondary | ICD-10-CM | POA: Diagnosis present

## 2019-07-01 DIAGNOSIS — O99214 Obesity complicating childbirth: Secondary | ICD-10-CM | POA: Diagnosis present

## 2019-07-01 DIAGNOSIS — F411 Generalized anxiety disorder: Secondary | ICD-10-CM

## 2019-07-01 DIAGNOSIS — O139 Gestational [pregnancy-induced] hypertension without significant proteinuria, unspecified trimester: Secondary | ICD-10-CM | POA: Insufficient documentation

## 2019-07-01 DIAGNOSIS — Z794 Long term (current) use of insulin: Secondary | ICD-10-CM

## 2019-07-01 DIAGNOSIS — O321XX0 Maternal care for breech presentation, not applicable or unspecified: Secondary | ICD-10-CM | POA: Diagnosis present

## 2019-07-01 DIAGNOSIS — E1165 Type 2 diabetes mellitus with hyperglycemia: Secondary | ICD-10-CM | POA: Diagnosis present

## 2019-07-01 DIAGNOSIS — O1413 Severe pre-eclampsia, third trimester: Secondary | ICD-10-CM | POA: Diagnosis present

## 2019-07-01 DIAGNOSIS — J4521 Mild intermittent asthma with (acute) exacerbation: Secondary | ICD-10-CM | POA: Diagnosis present

## 2019-07-01 DIAGNOSIS — O169 Unspecified maternal hypertension, unspecified trimester: Secondary | ICD-10-CM

## 2019-07-01 DIAGNOSIS — Z87891 Personal history of nicotine dependence: Secondary | ICD-10-CM

## 2019-07-01 DIAGNOSIS — O2412 Pre-existing diabetes mellitus, type 2, in childbirth: Secondary | ICD-10-CM | POA: Diagnosis present

## 2019-07-01 DIAGNOSIS — Z302 Encounter for sterilization: Secondary | ICD-10-CM

## 2019-07-01 DIAGNOSIS — O9952 Diseases of the respiratory system complicating childbirth: Secondary | ICD-10-CM | POA: Diagnosis present

## 2019-07-01 LAB — URINALYSIS, ROUTINE W REFLEX MICROSCOPIC
Bacteria, UA: NONE SEEN
Bilirubin Urine: NEGATIVE
Glucose, UA: 150 mg/dL — AB
Hgb urine dipstick: NEGATIVE
Ketones, ur: 80 mg/dL — AB
Leukocytes,Ua: NEGATIVE
Nitrite: NEGATIVE
Protein, ur: 30 mg/dL — AB
Specific Gravity, Urine: 1.025 (ref 1.005–1.030)
pH: 5 (ref 5.0–8.0)

## 2019-07-01 LAB — CBC WITH DIFFERENTIAL/PLATELET
Abs Immature Granulocytes: 0.36 10*3/uL — ABNORMAL HIGH (ref 0.00–0.07)
Basophils Absolute: 0 10*3/uL (ref 0.0–0.1)
Basophils Relative: 0 %
Eosinophils Absolute: 0.1 10*3/uL (ref 0.0–0.5)
Eosinophils Relative: 1 %
HCT: 35.3 % — ABNORMAL LOW (ref 36.0–46.0)
Hemoglobin: 11.3 g/dL — ABNORMAL LOW (ref 12.0–15.0)
Immature Granulocytes: 4 %
Lymphocytes Relative: 21 %
Lymphs Abs: 2.1 10*3/uL (ref 0.7–4.0)
MCH: 28.2 pg (ref 26.0–34.0)
MCHC: 32 g/dL (ref 30.0–36.0)
MCV: 88 fL (ref 80.0–100.0)
Monocytes Absolute: 0.5 10*3/uL (ref 0.1–1.0)
Monocytes Relative: 5 %
Neutro Abs: 7 10*3/uL (ref 1.7–7.7)
Neutrophils Relative %: 69 %
Platelets: 188 10*3/uL (ref 150–400)
RBC: 4.01 MIL/uL (ref 3.87–5.11)
RDW: 15.8 % — ABNORMAL HIGH (ref 11.5–15.5)
WBC: 10.1 10*3/uL (ref 4.0–10.5)
nRBC: 0.2 % (ref 0.0–0.2)

## 2019-07-01 LAB — COMPREHENSIVE METABOLIC PANEL
ALT: 20 U/L (ref 0–44)
AST: 17 U/L (ref 15–41)
Albumin: 2.2 g/dL — ABNORMAL LOW (ref 3.5–5.0)
Alkaline Phosphatase: 97 U/L (ref 38–126)
Anion gap: 14 (ref 5–15)
BUN: 7 mg/dL (ref 6–20)
CO2: 15 mmol/L — ABNORMAL LOW (ref 22–32)
Calcium: 9 mg/dL (ref 8.9–10.3)
Chloride: 105 mmol/L (ref 98–111)
Creatinine, Ser: 0.79 mg/dL (ref 0.44–1.00)
GFR calc Af Amer: >60 mL/min (ref >60)
GFR calc non Af Amer: >60 mL/min (ref >60)
Glucose, Bld: 188 mg/dL — ABNORMAL HIGH (ref 70–99)
Potassium: 3.5 mmol/L (ref 3.5–5.1)
Sodium: 134 mmol/L — ABNORMAL LOW (ref 135–145)
Total Bilirubin: 0.7 mg/dL (ref 0.3–1.2)
Total Protein: 5.5 g/dL — ABNORMAL LOW (ref 6.5–8.1)

## 2019-07-01 LAB — PROTEIN / CREATININE RATIO, URINE
Creatinine, Urine: 108.69 mg/dL
Protein Creatinine Ratio: 0.37 mg/mg{Cre} — ABNORMAL HIGH (ref 0.00–0.15)
Total Protein, Urine: 40 mg/dL

## 2019-07-01 MED ORDER — LACTATED RINGERS IV SOLN
INTRAVENOUS | Status: DC
  Administered 2019-07-01 – 2019-07-02 (×3): via INTRAVENOUS

## 2019-07-01 MED ORDER — ONDANSETRON HCL 8 MG PO TABS: 8.0000 mg | ORAL_TABLET | Freq: Three times a day (TID) | ORAL | 2 refills | Status: DC | PRN

## 2019-07-01 MED ORDER — INSULIN ASPART 100 UNIT/ML ~~LOC~~ SOLN: SUBCUTANEOUS | 11 refills | Status: DC

## 2019-07-01 MED ORDER — DIPHENHYDRAMINE HCL 50 MG/ML IJ SOLN
25.0000 mg | Freq: Once | INTRAMUSCULAR | Status: AC
  Administered 2019-07-01: 25 mg via INTRAVENOUS
  Filled 2019-07-01: qty 1

## 2019-07-01 MED ORDER — DEXAMETHASONE SODIUM PHOSPHATE 10 MG/ML IJ SOLN
10.0000 mg | Freq: Once | INTRAMUSCULAR | Status: DC
  Filled 2019-07-01: qty 1

## 2019-07-01 MED ORDER — BUTALBITAL-APAP-CAFFEINE 50-325-40 MG PO TABS
2.0000 | ORAL_TABLET | Freq: Once | ORAL | Status: AC
  Administered 2019-07-01: 2 via ORAL
  Filled 2019-07-01: qty 2

## 2019-07-01 MED ORDER — ACETAMINOPHEN 500 MG PO TABS: 1000.0000 mg | ORAL_TABLET | Freq: Once | ORAL | Status: DC

## 2019-07-01 MED ORDER — SODIUM CHLORIDE 0.9 % IV SOLN
INTRAVENOUS | Status: DC
  Administered 2019-07-01: 21:00:00 via INTRAVENOUS

## 2019-07-01 MED ORDER — INSULIN NPH (HUMAN) (ISOPHANE) 100 UNIT/ML ~~LOC~~ SUSP: SUBCUTANEOUS | 11 refills | Status: DC

## 2019-07-01 MED ORDER — CALCIUM CARBONATE ANTACID 500 MG PO CHEW: 2.0000 | CHEWABLE_TABLET | ORAL | Status: DC | PRN

## 2019-07-01 MED ORDER — DOCUSATE SODIUM 100 MG PO CAPS: 100.0000 mg | ORAL_CAPSULE | Freq: Every day | ORAL | Status: DC

## 2019-07-01 MED ORDER — METOCLOPRAMIDE HCL 5 MG/5ML PO SOLN
10.0000 mg | Freq: Once | ORAL | Status: DC
  Filled 2019-07-01: qty 10

## 2019-07-01 MED ORDER — METOCLOPRAMIDE HCL 5 MG/ML IJ SOLN
10.0000 mg | Freq: Once | INTRAMUSCULAR | Status: AC
  Administered 2019-07-01: 10 mg via INTRAVENOUS
  Filled 2019-07-01: qty 2

## 2019-07-01 MED ORDER — PRENATAL MULTIVITAMIN CH: 1.0000 | ORAL_TABLET | Freq: Every day | ORAL | Status: DC

## 2019-07-01 NOTE — Progress Notes (Signed)
   PRENATAL VISIT NOTE  Subjective:  Anna Jordan is a 28 y.o. [id number] at [redacted]w[redacted]d being seen today for ongoing prenatal care.  She is currently monitored for the following issues for this high-risk pregnancy and has Generalized anxiety disorder; History of vertebral compression fracture; Hx of pre-eclampsia in prior pregnancy, currently pregnant; Supervision of high-risk pregnancy; Type 2 diabetes mellitus affecting pregnancy, antepartum; Late Entry to [facility]- March 2020- Social Distancing; Maternal morbid obesity, antepartum ([place]); Lumbar facet joint pain; Spondylosis of lumbar spine; Sacroiliitis ([place]); Mild intermittent asthma with acute exacerbation; History of shoulder dystocia in prior pregnancy, currently pregnant; and Poorly controlled type 2 diabetes mellitus ([place]) on their problem list.  Patient reports nausea.  Contractions: Irritability. Vag. Bleeding: None.  Movement: Present. Denies leaking of fluid.   The following portions of the patient's history were reviewed and updated as appropriate: allergies, current medications, past family history, past medical history, past social history, past surgical history and problem list.   Objective:   Vitals:   07/01/19 1546  BP: (!) 154/63  Pulse: (!) 118  Weight: 262 lb (118.8 kg)    Fetal Status:     Movement: Present  Presentation: Transverse  General:  Alert, oriented and cooperative. Patient is in no acute distress.  Skin: Skin is warm and dry. No rash noted.   Cardiovascular: Normal heart rate noted  Respiratory: Normal respiratory effort, no problems with respiration noted  Abdomen: Soft, gravid, appropriate for gestational age.  Pain/Pressure: Present     Pelvic: Cervical exam deferred        Extremities: Normal range of motion.  Edema: Trace  Mental Status: Normal mood and affect. Normal behavior. Normal judgment and thought content.   Assessment and Plan:  Pregnancy: [id number] at [redacted]w[redacted]d 1. Generalized anxiety disorder  -pt seeing behavioral therapist at [facility] Declines meds at this time  2. Poorly controlled type 2 diabetes mellitus ([facility]) [place] elevated--increase evening NPH by 10% now 88 units All postprandials are elevated--140s; will incrase premeal novalog to 44 units.   3.  Gestational Hypertension Elevated BPs on 8/15 and today.  Pt denies headache.  On exam she is moderate RUQ tenderness.  Reflexes are brisk.  Pt needs evaluation for Pre-E.  She has history of severe pre E with last pregnancy needing preterm delivery.  Will send to [facility] for timely evaluation.   NST reactive today and baby in breech presentation.    Preterm labor symptoms and general obstetric precautions including but not limited to vaginal bleeding, contractions, leaking of fluid and fetal movement were reviewed in detail with the patient. Please refer to After Visit Summary for other counseling recommendations.   No follow-ups on file.  Future Appointments  Date Time Provider [place]  07/04/2019  3:30 PM [place] [facility]  07/06/2019  9:30 AM [facility]-MAU 1 [facility]-INDC None  07/08/2019  8:00 AM MC-LD SCHED ROOM [facility]-INDC None  07/08/2019  4:00 PM Emily Filbert, MD [facility]  07/11/2019  3:45 PM [place] [facility]  07/11/2019  3:45 PM [place] 2 [facility]-US  07/11/2019  4:00 PM [place] [facility] [place] [facility]    Silas [place], MD

## 2019-07-01 NOTE — MAU Provider Note (Signed)
History     [id number]  Arrival date and time: 07/01/19 1857    Chief Complaint  Patient presents with  . Hypertension     HPI Anna Jordan is a 28 y.o. at 3432w0d by 9 week US, who presents for evaluation for PreE.   Sent from clinic by Dr. Penne LashLeggett c/f PreE Had elevated BP on 8/15 and 8/17, in clinic with RUQ pain Reporting headache that started a few hours ago that has been worsening Reports tenderness underneath R breast Also feeling dizzy and nauseous Reports flashes of light in her vision Denies chest pain, endorses SOB Denies swelling in legs but thinks hands may be swollen  Vaginal bleeding: No LOF: No Fetal Movement: Yes Contractions: No  --/--/O POS (08/15 2131)  OB History    Gravida  5   Para  2   Term  1   Preterm  1   AB  2   Living  2     SAB  2   TAB      Ectopic      Multiple      Live Births  2           Past Medical History:  Diagnosis Date  . Anxiety   . Class 3 severe obesity due to excess calories with body mass index (BMI) of 50.0 to 59.9 in adult (HCC) 07/18/2018  . Depression   . Gestational diabetes   . Hypertension   . Obesity   . Schizophrenia [facility](HCC)     Past Surgical History:  Procedure Laterality Date  . GALLBLADDER SURGERY  08/2018  . WISDOM TOOTH EXTRACTION      Family History  Problem Relation Age of Onset  . Depression Mother   . Diabetes Mother   . Heart disease Mother   . Stroke Father   . Hypertension Father   . Breast cancer Maternal Aunt        4 mat aunts with breast cancer  . Diabetes Brother   . Breast cancer Maternal Grandmother   . Breast cancer Paternal Grandmother   . Breast cancer Paternal Grandfather     Social History   Socioeconomic History  . Marital status: Married    Spouse name: Not on file  . Number of children: Not on file  . Years of education: Not on file  . Highest education level: Not on file  Occupational History  . Occupation: homemaker  Social Needs  .  Financial resource strain: Not on file  . Food insecurity    Worry: Not on file    Inability: Not on file  . Transportation needs    Medical: Not on file    Non-medical: Not on file  Tobacco Use  . Smoking status: Former Smoker    Packs/day: 0.50    Years: 15.00    Pack years: 7.50    Types: Cigarettes    Quit date: 03/31/2017    Years since quitting: 2.2  . Smokeless tobacco: Never Used  . Tobacco comment: Quit previously x 1 year, Prior to quittting this time, smoked 0.5-1ppd  Substance and Sexual Activity  . Alcohol use: No  . Drug use: No  . Sexual activity: Yes    Partners: Male    Birth control/protection: None  Lifestyle  . Physical activity    Days per week: Not on file    Minutes per session: Not on file  . Stress: Not on file  Relationships  . Social connections  Talks on phone: Not on file    Gets together: Not on file    Attends religious service: Not on file    Active member of club or organization: Not on file    Attends meetings of clubs or organizations: Not on file    Relationship status: Not on file  . Intimate partner violence    Fear of current or ex partner: Not on file    Emotionally abused: Not on file    Physically abused: Not on file    Forced sexual activity: Not on file  Other Topics Concern  . Not on file  Social History Narrative  . Not on file    Allergies  Allergen Reactions  . Hydrocodone Hives  . Tramadol Hives    No current facility-administered medications on file prior to encounter.    Current Outpatient Medications on File Prior to Encounter  Medication Sig Dispense Refill  . Accu-Chek FastClix Lancets MISC 1 Device by Percutaneous route 4 (four) times daily. 100 each 12  . albuterol (PROVENTIL HFA;VENTOLIN HFA) 108 (90 Base) MCG/ACT inhaler Inhale 2 puffs into the lungs every 6 (six) hours as needed for wheezing or shortness of breath.    Marland Kitchen aspirin EC 81 MG tablet Take 1 tablet (81 mg total) by mouth daily. Take after  12 weeks for prevention of preeclampsia later in pregnancy 300 tablet 0  . Blood Glucose Monitoring Suppl (ACCU-CHEK AVIVA) device Use as instructed 1 each 0  . glucose blood test strip Use to check CBG's QID 100 each 12  . insulin aspart (NOVOLOG) 100 UNIT/ML injection Inject 44 units into the skin prior to each meal 10 mL 11  . insulin NPH Human (NOVOLIN N) 100 UNIT/ML injection Inject 0.55 mLs (55 Units total) into the skin daily before breakfast. 10 mL 11  . insulin NPH Human (NOVOLIN N) 100 UNIT/ML injection Inject 88 units into skin before bedtime. 10 mL 11  . Insulin Syringes, Disposable, U-100 1 ML MISC 1 Syringe by Does not apply route 3 (three) times daily. 100 each 12  . loratadine (CLARITIN) 10 MG tablet Take 10 mg by mouth daily.    Marland Kitchen omeprazole (PRILOSEC) 20 MG capsule Take 1 capsule (20 mg total) by mouth daily. 30 capsule 4  . ondansetron (ZOFRAN) 8 MG tablet Take 1 tablet (8 mg total) by mouth every 8 (eight) hours as needed for nausea or vomiting. 30 tablet 2  . Prenatal Vit-Fe Fumarate-FA (MULTIVITAMIN-PRENATAL) 27-0.8 MG TABS tablet Take 1 tablet by mouth daily at 12 noon. 30 tablet 10  . tiZANidine (ZANAFLEX) 4 MG tablet Take 4 mg by mouth every 8 (eight) hours as needed for muscle spasms.  1  . [DISCONTINUED] risperiDONE (RISPERDAL) 2 MG tablet Take 1 tablet (2 mg total) by mouth 2 (two) times daily. (Patient not taking: Reported on 01/13/2017) 60 tablet 1     ROS Complete ROS completed and otherwise negative except as noted in HPI  Physical Exam   BP (!) 154/86   Pulse (!) 107   Resp 18   Ht 5\' 3"  (1.6 m)   LMP 10/29/2018   BMI 46.41 kg/m  Patient Vitals for the past 24 hrs:  BP Pulse Resp Height  07/01/19 2301 140/75 (!) 112 - -  07/01/19 2246 (!) 143/75 (!) 120 - -  07/01/19 2239 124/86 (!) 119 - -  07/01/19 2231 (!) 144/73 (!) 112 - -  07/01/19 2226 135/84 (!) 119 - -  07/01/19 2216 Marland Kitchen)  134/107 (!) 114 - -  07/01/19 2201 128/82 (!) 109 - -  07/01/19 2147  123/67 (!) 117 - -  07/01/19 2137 135/72 (!) 107 - -  07/01/19 2133 (!) 122/52 (!) 105 - -  07/01/19 2126 (!) 91/42 (!) 107 - -  07/01/19 2121 (!) 103/54 (!) 115 - -  07/01/19 2116 (!) 89/59 (!) 114 - -  07/01/19 2111 (!) 84/51 (!) 106 - -  07/01/19 2109 (!) 92/49 (!) 106 - -  07/01/19 2105 104/83 (!) 122 - -  07/01/19 2100 116/73 (!) 118 - -  07/01/19 2049 (!) 132/47 (!) 102 - -  07/01/19 2047 107/69 (!) 108 - -  07/01/19 2017 116/67 (!) 118 - -  07/01/19 2002 (!) 154/86 (!) 107 - -  07/01/19 1940 114/88 (!) 133 - -  07/01/19 1922 140/75 (!) 118 18 5\' 3"  (1.6 m)    Physical Exam  Vitals reviewed. Constitutional: She appears well-developed and well-nourished. No distress.  Cardiovascular: Normal rate, regular rhythm and normal heart sounds.  No murmur heard. Respiratory: Effort normal and breath sounds normal. No respiratory distress. She has no wheezes. She has no rales.  GI: Soft. Bowel sounds are normal. She exhibits no distension. There is abdominal tenderness. There is no rebound and no guarding.  +RUQ tenderness to palpation  Musculoskeletal:        General: No edema.  Neurological: She is alert. Coordination normal.  Skin: Skin is warm and dry. She is not diaphoretic.  Psychiatric: She has a normal mood and affect.    Bedside Ultrasound n/a  FHT Discontinuous and unable to interpret, appears to have normal baseline and moderate variability but insufficient tracing to definitively comment  Labs Results for orders placed or performed during the hospital encounter of 07/01/19 (from the past 24 hour(s))  Urinalysis, Routine w reflex microscopic     Status: Abnormal   Collection Time: 07/01/19  7:47 PM  Result Value Ref Range   Color, Urine YELLOW YELLOW   APPearance HAZY (A) CLEAR   Specific Gravity, Urine 1.025 1.005 - 1.030   pH 5.0 5.0 - 8.0   Glucose, UA 150 (A) NEGATIVE mg/dL   Hgb urine dipstick NEGATIVE NEGATIVE   Bilirubin Urine NEGATIVE NEGATIVE    Ketones, ur 80 (A) NEGATIVE mg/dL   Protein, ur 30 (A) NEGATIVE mg/dL   Nitrite NEGATIVE NEGATIVE   Leukocytes,Ua NEGATIVE NEGATIVE   RBC / HPF 0-5 0 - 5 RBC/hpf   WBC, UA 0-5 0 - 5 WBC/hpf   Bacteria, UA NONE SEEN NONE SEEN   Squamous Epithelial / LPF 0-5 0 - 5   Mucus PRESENT    Ca Oxalate Crys, UA PRESENT   Protein / creatinine ratio, urine     Status: Abnormal   Collection Time: 07/01/19  7:47 PM  Result Value Ref Range   Creatinine, Urine 108.69 mg/dL   Total Protein, Urine 40 mg/dL   Protein Creatinine Ratio 0.37 (H) 0.00 - 0.15 mg/mg[Cre]  CBC with Differential/Platelet     Status: Abnormal   Collection Time: 07/01/19  8:58 PM  Result Value Ref Range   WBC 10.1 4.0 - 10.5 K/uL   RBC 4.01 3.87 - 5.11 MIL/uL   Hemoglobin 11.3 (L) 12.0 - 15.0 g/dL   HCT 16.135.3 (L) 09.636.0 - 04.546.0 %   MCV 88.0 80.0 - 100.0 fL   MCH 28.2 26.0 - 34.0 pg   MCHC 32.0 30.0 - 36.0 g/dL   RDW 40.915.8 (H) 81.111.5 -  15.5 %   Platelets 188 150 - 400 K/uL   nRBC 0.2 0.0 - 0.2 %   Neutrophils Relative % 69 %   Neutro Abs 7.0 1.7 - 7.7 K/uL   Lymphocytes Relative 21 %   Lymphs Abs 2.1 0.7 - 4.0 K/uL   Monocytes Relative 5 %   Monocytes Absolute 0.5 0.1 - 1.0 K/uL   Eosinophils Relative 1 %   Eosinophils Absolute 0.1 0.0 - 0.5 K/uL   Basophils Relative 0 %   Basophils Absolute 0.0 0.0 - 0.1 K/uL   Immature Granulocytes 4 %   Abs Immature Granulocytes 0.36 (H) 0.00 - 0.07 K/uL  Comprehensive metabolic panel     Status: Abnormal   Collection Time: 07/01/19  8:58 PM  Result Value Ref Range   Sodium 134 (L) 135 - 145 mmol/L   Potassium 3.5 3.5 - 5.1 mmol/L   Chloride 105 98 - 111 mmol/L   CO2 15 (L) 22 - 32 mmol/L   Glucose, Bld 188 (H) 70 - 99 mg/dL   BUN 7 6 - 20 mg/dL   Creatinine, Ser 4.090.79 0.44 - 1.00 mg/dL   Calcium 9.0 8.9 - 81.110.3 mg/dL   Total Protein 5.5 (L) 6.5 - 8.1 g/dL   Albumin 2.2 (L) 3.5 - 5.0 g/dL   AST 17 15 - 41 U/L   ALT 20 0 - 44 U/L   Alkaline Phosphatase 97 38 - 126 U/L   Total  Bilirubin 0.7 0.3 - 1.2 mg/dL   GFR calc non Af Amer >60 >60 mL/min   GFR calc Af Amer >60 >60 mL/min   Anion gap 14 5 - 15     MAU Course  Procedures CBC, CMP, UPCR  MDM Moderate  Assessment and Plan  #Preeclampsia #Headache Diagnosis by mild range pressures on multiple days with UPCR of 0.37, also presenting with severe headache and RUQ tenderness. CBC and CMP pending at this time so unable to make diagnosis with or without severe features but may meet them by symptoms depending on course in MAU. Will trial headache cocktail of benadryl/reglan/decadron/IVF and see if there is improvement. Dispo pending lab workup and symptom progression.  Signed out at end of shift.   Venora MaplesMatthew M Eckstat   NST:  Baseline: 145 bpm, Variability: Good {> 6 bpm), Accelerations: Reactive, Decelerations: Absent and intermittent tracing  Decadron held d/t poorly controlled diabetes. Pt given IV reglan & benadryl for headache. Reports light improvement in headache down to 6/10.  Reviewed labs & BPs with Dr. Earlene Plateravis. Will admit & repeat labs tomorrow.   A:  1. Pre-eclampsia in third trimester    P: Admit to OBSC.unit Repeat PEC labs in the morning Treat headache NICU notified of admission by Dr. Susann Givensavis  Kirti Carl, Denny PeonErin, NP

## 2019-07-01 NOTE — MAU Note (Signed)
Pt sent from office for elevated b/p. Pt c/o headache and pain in her URQ. Good fetal movement felt.

## 2019-07-02 ENCOUNTER — Encounter (HOSPITAL_COMMUNITY)
Admission: AD | Disposition: A | Discharge status: Home / Self Care | Payer: Self-pay | Source: Home / Self Care | Attending: Family Medicine

## 2019-07-02 ENCOUNTER — Encounter (HOSPITAL_COMMUNITY): Payer: Self-pay

## 2019-07-02 ENCOUNTER — Other Ambulatory Visit: Payer: Self-pay

## 2019-07-02 ENCOUNTER — Inpatient Hospital Stay (HOSPITAL_COMMUNITY): Payer: Medicaid Other | Admitting: Anesthesiology

## 2019-07-02 DIAGNOSIS — O99214 Obesity complicating childbirth: Secondary | ICD-10-CM | POA: Diagnosis present

## 2019-07-02 DIAGNOSIS — Z3A35 35 weeks gestation of pregnancy: Secondary | ICD-10-CM

## 2019-07-02 DIAGNOSIS — Z794 Long term (current) use of insulin: Secondary | ICD-10-CM | POA: Diagnosis not present

## 2019-07-02 DIAGNOSIS — E1165 Type 2 diabetes mellitus with hyperglycemia: Secondary | ICD-10-CM | POA: Diagnosis present

## 2019-07-02 DIAGNOSIS — O2412 Pre-existing diabetes mellitus, type 2, in childbirth: Secondary | ICD-10-CM | POA: Diagnosis present

## 2019-07-02 DIAGNOSIS — J4521 Mild intermittent asthma with (acute) exacerbation: Secondary | ICD-10-CM | POA: Diagnosis present

## 2019-07-02 DIAGNOSIS — O9952 Diseases of the respiratory system complicating childbirth: Secondary | ICD-10-CM | POA: Diagnosis present

## 2019-07-02 DIAGNOSIS — Z20828 Contact with and (suspected) exposure to other viral communicable diseases: Secondary | ICD-10-CM | POA: Diagnosis present

## 2019-07-02 DIAGNOSIS — O1490 Unspecified pre-eclampsia, unspecified trimester: Secondary | ICD-10-CM

## 2019-07-02 DIAGNOSIS — O321XX0 Maternal care for breech presentation, not applicable or unspecified: Secondary | ICD-10-CM | POA: Diagnosis present

## 2019-07-02 DIAGNOSIS — O1414 Severe pre-eclampsia complicating childbirth: Secondary | ICD-10-CM | POA: Diagnosis present

## 2019-07-02 DIAGNOSIS — Z87891 Personal history of nicotine dependence: Secondary | ICD-10-CM | POA: Diagnosis not present

## 2019-07-02 DIAGNOSIS — O1413 Severe pre-eclampsia, third trimester: Secondary | ICD-10-CM | POA: Diagnosis present

## 2019-07-02 DIAGNOSIS — O24424 Gestational diabetes mellitus in childbirth, insulin controlled: Secondary | ICD-10-CM

## 2019-07-02 DIAGNOSIS — Z302 Encounter for sterilization: Secondary | ICD-10-CM | POA: Diagnosis not present

## 2019-07-02 LAB — COMPREHENSIVE METABOLIC PANEL
ALT: 21 U/L (ref 0–44)
ALT: 21 U/L (ref 0–44)
AST: 18 U/L (ref 15–41)
AST: 15 U/L (ref 15–41)
Albumin: 2.2 g/dL — ABNORMAL LOW (ref 3.5–5.0)
Albumin: 2.3 g/dL — ABNORMAL LOW (ref 3.5–5.0)
Alkaline Phosphatase: 109 U/L (ref 38–126)
Alkaline Phosphatase: 108 U/L (ref 38–126)
Anion gap: 11 (ref 5–15)
Anion gap: 10 (ref 5–15)
BUN: 7 mg/dL (ref 6–20)
BUN: 5 mg/dL — ABNORMAL LOW (ref 6–20)
CO2: 17 mmol/L — ABNORMAL LOW (ref 22–32)
CO2: 16 mmol/L — ABNORMAL LOW (ref 22–32)
Calcium: 9.3 mg/dL (ref 8.9–10.3)
Calcium: 8.4 mg/dL — ABNORMAL LOW (ref 8.9–10.3)
Chloride: 106 mmol/L (ref 98–111)
Chloride: 109 mmol/L (ref 98–111)
Creatinine, Ser: 0.69 mg/dL (ref 0.44–1.00)
Creatinine, Ser: 0.57 mg/dL (ref 0.44–1.00)
GFR calc Af Amer: >60 mL/min (ref >60)
GFR calc Af Amer: >60 mL/min (ref >60)
GFR calc non Af Amer: >60 mL/min (ref >60)
GFR calc non Af Amer: >60 mL/min (ref >60)
Glucose, Bld: 234 mg/dL — ABNORMAL HIGH (ref 70–99)
Glucose, Bld: 204 mg/dL — ABNORMAL HIGH (ref 70–99)
Potassium: 4 mmol/L (ref 3.5–5.1)
Potassium: 3.6 mmol/L (ref 3.5–5.1)
Sodium: 134 mmol/L — ABNORMAL LOW (ref 135–145)
Sodium: 135 mmol/L (ref 135–145)
Total Bilirubin: 0.6 mg/dL (ref 0.3–1.2)
Total Bilirubin: 0.3 mg/dL (ref 0.3–1.2)
Total Protein: 5.7 g/dL — ABNORMAL LOW (ref 6.5–8.1)
Total Protein: 5.6 g/dL — ABNORMAL LOW (ref 6.5–8.1)

## 2019-07-02 LAB — GLUCOSE, CAPILLARY
Glucose-Capillary: 231 mg/dL — ABNORMAL HIGH (ref 70–99)
Glucose-Capillary: 212 mg/dL — ABNORMAL HIGH (ref 70–99)
Glucose-Capillary: 220 mg/dL — ABNORMAL HIGH (ref 70–99)
Glucose-Capillary: 219 mg/dL — ABNORMAL HIGH (ref 70–99)
Glucose-Capillary: 220 mg/dL — ABNORMAL HIGH (ref 70–99)
Glucose-Capillary: 223 mg/dL — ABNORMAL HIGH (ref 70–99)
Glucose-Capillary: 187 mg/dL — ABNORMAL HIGH (ref 70–99)
Glucose-Capillary: 163 mg/dL — ABNORMAL HIGH (ref 70–99)
Glucose-Capillary: 138 mg/dL — ABNORMAL HIGH (ref 70–99)
Glucose-Capillary: 148 mg/dL — ABNORMAL HIGH (ref 70–99)
Glucose-Capillary: 139 mg/dL — ABNORMAL HIGH (ref 70–99)
Glucose-Capillary: 139 mg/dL — ABNORMAL HIGH (ref 70–99)

## 2019-07-02 LAB — CBC
HCT: 36 % (ref 36.0–46.0)
HCT: 37 % (ref 36.0–46.0)
Hemoglobin: 11.5 g/dL — ABNORMAL LOW (ref 12.0–15.0)
Hemoglobin: 11.6 g/dL — ABNORMAL LOW (ref 12.0–15.0)
MCH: 27.9 pg (ref 26.0–34.0)
MCH: 27.7 pg (ref 26.0–34.0)
MCHC: 31.9 g/dL (ref 30.0–36.0)
MCHC: 31.4 g/dL (ref 30.0–36.0)
MCV: 87.4 fL (ref 80.0–100.0)
MCV: 88.3 fL (ref 80.0–100.0)
Platelets: 207 10*3/uL (ref 150–400)
Platelets: 210 10*3/uL (ref 150–400)
RBC: 4.12 MIL/uL (ref 3.87–5.11)
RBC: 4.19 MIL/uL (ref 3.87–5.11)
RDW: 15.7 % — ABNORMAL HIGH (ref 11.5–15.5)
RDW: 15.5 % (ref 11.5–15.5)
WBC: 10.8 10*3/uL — ABNORMAL HIGH (ref 4.0–10.5)
WBC: 9.3 10*3/uL (ref 4.0–10.5)
nRBC: 0 % (ref 0.0–0.2)
nRBC: 0 % (ref 0.0–0.2)

## 2019-07-02 LAB — SARS CORONAVIRUS 2 BY RT PCR (HOSPITAL ORDER, PERFORMED IN ~~LOC~~ HOSPITAL LAB): SARS Coronavirus 2: NEGATIVE

## 2019-07-02 LAB — TYPE AND SCREEN
ABO/RH(D): O POS
Antibody Screen: NEGATIVE

## 2019-07-02 LAB — GROUP B STREP BY PCR: Group B strep by PCR: POSITIVE — AB

## 2019-07-02 SURGERY — Surgical Case
Anesthesia: Epidural | Wound class: Clean Contaminated

## 2019-07-02 MED ORDER — WITCH HAZEL-GLYCERIN EX PADS: 1.0000 "application " | MEDICATED_PAD | CUTANEOUS | Status: DC | PRN

## 2019-07-02 MED ORDER — OXYCODONE HCL 5 MG/5ML PO SOLN: 5.0000 mg | Freq: Once | ORAL | Status: DC | PRN

## 2019-07-02 MED ORDER — FENTANYL CITRATE (PF) 100 MCG/2ML IJ SOLN
INTRAMUSCULAR | Status: AC
  Filled 2019-07-02: qty 2

## 2019-07-02 MED ORDER — PHENYLEPHRINE 40 MCG/ML (10ML) SYRINGE FOR IV PUSH (FOR BLOOD PRESSURE SUPPORT): 80.0000 ug | PREFILLED_SYRINGE | INTRAVENOUS | Status: DC | PRN

## 2019-07-02 MED ORDER — NALBUPHINE HCL 10 MG/ML IJ SOLN: 5.0000 mg | INTRAMUSCULAR | Status: DC | PRN

## 2019-07-02 MED ORDER — LACTATED RINGERS IV SOLN: INTRAVENOUS | Status: DC

## 2019-07-02 MED ORDER — METFORMIN HCL 500 MG PO TABS
500.0000 mg | ORAL_TABLET | Freq: Two times a day (BID) | ORAL | Status: DC
  Administered 2019-07-03 – 2019-07-05 (×4): 500 mg via ORAL
  Filled 2019-07-02 (×4): qty 1

## 2019-07-02 MED ORDER — LIDOCAINE-EPINEPHRINE (PF) 2 %-1:200000 IJ SOLN
INTRAMUSCULAR | Status: DC | PRN
  Administered 2019-07-02 (×2): 3 mL via EPIDURAL

## 2019-07-02 MED ORDER — PHENYLEPHRINE 40 MCG/ML (10ML) SYRINGE FOR IV PUSH (FOR BLOOD PRESSURE SUPPORT)
80.0000 ug | PREFILLED_SYRINGE | INTRAVENOUS | Status: DC | PRN
  Filled 2019-07-02: qty 10

## 2019-07-02 MED ORDER — NALBUPHINE HCL 10 MG/ML IJ SOLN: 5.0000 mg | Freq: Once | INTRAMUSCULAR | Status: DC | PRN

## 2019-07-02 MED ORDER — PROPOFOL 10 MG/ML IV BOLUS
INTRAVENOUS | Status: AC
  Filled 2019-07-02: qty 40

## 2019-07-02 MED ORDER — LABETALOL HCL 5 MG/ML IV SOLN: 40.0000 mg | INTRAVENOUS | Status: DC | PRN

## 2019-07-02 MED ORDER — SENNOSIDES-DOCUSATE SODIUM 8.6-50 MG PO TABS
2.0000 | ORAL_TABLET | ORAL | Status: DC
  Administered 2019-07-02 – 2019-07-03 (×2): 2 via ORAL
  Filled 2019-07-02 (×2): qty 2

## 2019-07-02 MED ORDER — FENTANYL CITRATE (PF) 100 MCG/2ML IJ SOLN
25.0000 ug | INTRAMUSCULAR | Status: DC | PRN
  Administered 2019-07-02 (×2): 50 ug via INTRAVENOUS

## 2019-07-02 MED ORDER — LABETALOL HCL 5 MG/ML IV SOLN: 80.0000 mg | INTRAVENOUS | Status: DC | PRN

## 2019-07-02 MED ORDER — SIMETHICONE 80 MG PO CHEW: 80.0000 mg | CHEWABLE_TABLET | ORAL | Status: DC | PRN

## 2019-07-02 MED ORDER — PROMETHAZINE HCL 25 MG/ML IJ SOLN: 6.2500 mg | INTRAMUSCULAR | Status: DC | PRN

## 2019-07-02 MED ORDER — DEXTROSE 5 % IV SOLN
3.0000 g | INTRAVENOUS | Status: AC
  Administered 2019-07-02: 3 g via INTRAVENOUS
  Filled 2019-07-02: qty 3000

## 2019-07-02 MED ORDER — OXYTOCIN 40 UNITS IN NORMAL SALINE INFUSION - SIMPLE MED: 2.5000 [IU]/h | INTRAVENOUS | Status: AC

## 2019-07-02 MED ORDER — DEXTROSE IN LACTATED RINGERS 5 % IV SOLN
INTRAVENOUS | Status: DC
  Administered 2019-07-02: 08:00:00 via INTRAVENOUS

## 2019-07-02 MED ORDER — GLYCOPYRROLATE 0.2 MG/ML IJ SOLN
INTRAMUSCULAR | Status: DC | PRN
  Administered 2019-07-02: 0.1 mg via INTRAVENOUS

## 2019-07-02 MED ORDER — LACTATED RINGERS IV SOLN: 500.0000 mL | Freq: Once | INTRAVENOUS | Status: DC

## 2019-07-02 MED ORDER — SCOPOLAMINE 1 MG/3DAYS TD PT72
1.0000 | MEDICATED_PATCH | Freq: Once | TRANSDERMAL | Status: AC
  Administered 2019-07-02: 1.5 mg via TRANSDERMAL

## 2019-07-02 MED ORDER — ACETAMINOPHEN 500 MG PO TABS
1000.0000 mg | ORAL_TABLET | Freq: Four times a day (QID) | ORAL | Status: AC
  Administered 2019-07-02 – 2019-07-03 (×2): 1000 mg via ORAL
  Filled 2019-07-02 (×2): qty 2

## 2019-07-02 MED ORDER — DIPHENHYDRAMINE HCL 50 MG/ML IJ SOLN: 12.5000 mg | INTRAMUSCULAR | Status: DC | PRN

## 2019-07-02 MED ORDER — SUCCINYLCHOLINE CHLORIDE 200 MG/10ML IV SOSY
PREFILLED_SYRINGE | INTRAVENOUS | Status: AC
  Filled 2019-07-02: qty 10

## 2019-07-02 MED ORDER — NALOXONE HCL 0.4 MG/ML IJ SOLN: 0.4000 mg | INTRAMUSCULAR | Status: DC | PRN

## 2019-07-02 MED ORDER — LIDOCAINE HCL (PF) 1 % IJ SOLN: 30.0000 mL | INTRAMUSCULAR | Status: DC | PRN

## 2019-07-02 MED ORDER — HYDROMORPHONE HCL 1 MG/ML IJ SOLN
INTRAMUSCULAR | Status: AC
  Filled 2019-07-02: qty 0.5

## 2019-07-02 MED ORDER — OXYTOCIN 40 UNITS IN NORMAL SALINE INFUSION - SIMPLE MED: 2.5000 [IU]/h | INTRAVENOUS | Status: DC

## 2019-07-02 MED ORDER — HYDRALAZINE HCL 20 MG/ML IJ SOLN: 10.0000 mg | INTRAMUSCULAR | Status: DC | PRN

## 2019-07-02 MED ORDER — SODIUM CHLORIDE (PF) 0.9 % IJ SOLN
INTRAMUSCULAR | Status: DC | PRN
  Administered 2019-07-02: 12 mL/h via EPIDURAL

## 2019-07-02 MED ORDER — FENTANYL CITRATE (PF) 100 MCG/2ML IJ SOLN
INTRAMUSCULAR | Status: DC | PRN
  Administered 2019-07-02 (×2): 50 ug via INTRAVENOUS
  Administered 2019-07-02: 35 ug via INTRAVENOUS
  Administered 2019-07-02: 75 ug via INTRAVENOUS
  Administered 2019-07-02: 40 ug via INTRAVENOUS
  Administered 2019-07-02: 100 ug via INTRAVENOUS

## 2019-07-02 MED ORDER — DEXTROSE 5 % IV SOLN
INTRAVENOUS | Status: AC
  Filled 2019-07-02: qty 3000

## 2019-07-02 MED ORDER — OXYTOCIN BOLUS FROM INFUSION: 500.0000 mL | Freq: Once | INTRAVENOUS | Status: DC

## 2019-07-02 MED ORDER — MENTHOL 3 MG MT LOZG: 1.0000 | LOZENGE | OROMUCOSAL | Status: DC | PRN

## 2019-07-02 MED ORDER — COCONUT OIL OIL
1.0000 "application " | TOPICAL_OIL | Status: DC | PRN
  Administered 2019-07-03: 1 via TOPICAL

## 2019-07-02 MED ORDER — INSULIN NPH (HUMAN) (ISOPHANE) 100 UNIT/ML ~~LOC~~ SUSP
55.0000 [IU] | Freq: Every day | SUBCUTANEOUS | Status: DC
  Filled 2019-07-02: qty 10

## 2019-07-02 MED ORDER — ONDANSETRON HCL 4 MG/2ML IJ SOLN: 4.0000 mg | Freq: Three times a day (TID) | INTRAMUSCULAR | Status: DC | PRN

## 2019-07-02 MED ORDER — SCOPOLAMINE 1 MG/3DAYS TD PT72
MEDICATED_PATCH | TRANSDERMAL | Status: AC
  Filled 2019-07-02: qty 1

## 2019-07-02 MED ORDER — ONDANSETRON HCL 4 MG/2ML IJ SOLN
INTRAMUSCULAR | Status: DC | PRN
  Administered 2019-07-02: 4 mg via INTRAVENOUS

## 2019-07-02 MED ORDER — SOD CITRATE-CITRIC ACID 500-334 MG/5ML PO SOLN
30.0000 mL | ORAL | Status: DC | PRN
  Administered 2019-07-02: 30 mL via ORAL
  Filled 2019-07-02: qty 30

## 2019-07-02 MED ORDER — MIDAZOLAM HCL 2 MG/2ML IJ SOLN
INTRAMUSCULAR | Status: AC
  Filled 2019-07-02: qty 2

## 2019-07-02 MED ORDER — SUCCINYLCHOLINE CHLORIDE 200 MG/10ML IV SOSY
PREFILLED_SYRINGE | INTRAVENOUS | Status: DC | PRN
  Administered 2019-07-02: 200 mg via INTRAVENOUS

## 2019-07-02 MED ORDER — MAGNESIUM SULFATE 40 G IN LACTATED RINGERS - SIMPLE
2.0000 g/h | INTRAVENOUS | Status: AC
  Administered 2019-07-02 – 2019-07-03 (×2): 2 g/h via INTRAVENOUS
  Filled 2019-07-02: qty 500

## 2019-07-02 MED ORDER — ONDANSETRON HCL 4 MG/2ML IJ SOLN: 4.0000 mg | Freq: Four times a day (QID) | INTRAMUSCULAR | Status: DC | PRN

## 2019-07-02 MED ORDER — MORPHINE SULFATE (PF) 0.5 MG/ML IJ SOLN
INTRAMUSCULAR | Status: AC
  Filled 2019-07-02: qty 10

## 2019-07-02 MED ORDER — PROPOFOL 10 MG/ML IV BOLUS
INTRAVENOUS | Status: AC
  Filled 2019-07-02: qty 20

## 2019-07-02 MED ORDER — FENTANYL-BUPIVACAINE-NACL 0.5-0.125-0.9 MG/250ML-% EP SOLN
12.0000 mL/h | EPIDURAL | Status: DC | PRN
  Filled 2019-07-02: qty 250

## 2019-07-02 MED ORDER — MEPERIDINE HCL 25 MG/ML IJ SOLN: 6.2500 mg | INTRAMUSCULAR | Status: DC | PRN

## 2019-07-02 MED ORDER — OXYCODONE-ACETAMINOPHEN 5-325 MG PO TABS: 1.0000 | ORAL_TABLET | ORAL | Status: DC | PRN

## 2019-07-02 MED ORDER — PHENYLEPHRINE 40 MCG/ML (10ML) SYRINGE FOR IV PUSH (FOR BLOOD PRESSURE SUPPORT)
PREFILLED_SYRINGE | INTRAVENOUS | Status: AC
  Filled 2019-07-02: qty 10

## 2019-07-02 MED ORDER — MIDAZOLAM HCL 2 MG/2ML IJ SOLN
INTRAMUSCULAR | Status: DC | PRN
  Administered 2019-07-02: 1 mg via INTRAVENOUS
  Administered 2019-07-02 (×2): 0.5 mg via INTRAVENOUS

## 2019-07-02 MED ORDER — TERBUTALINE SULFATE 1 MG/ML IJ SOLN
0.2500 mg | Freq: Once | INTRAMUSCULAR | Status: AC
  Administered 2019-07-02: 0.25 mg via SUBCUTANEOUS
  Filled 2019-07-02: qty 1

## 2019-07-02 MED ORDER — SODIUM CHLORIDE 0.9 % IV SOLN
INTRAVENOUS | Status: DC | PRN
  Administered 2019-07-02: 18:00:00 via INTRAVENOUS

## 2019-07-02 MED ORDER — HYDROMORPHONE HCL 1 MG/ML IJ SOLN
0.2500 mg | INTRAMUSCULAR | Status: DC | PRN
  Administered 2019-07-02 (×3): 0.5 mg via INTRAVENOUS

## 2019-07-02 MED ORDER — IBUPROFEN 800 MG PO TABS: 800.0000 mg | ORAL_TABLET | Freq: Four times a day (QID) | ORAL | Status: DC

## 2019-07-02 MED ORDER — DIPHENHYDRAMINE HCL 50 MG/ML IJ SOLN
25.0000 mg | Freq: Four times a day (QID) | INTRAMUSCULAR | Status: DC | PRN
  Administered 2019-07-02: 25 mg via INTRAVENOUS
  Filled 2019-07-02: qty 1

## 2019-07-02 MED ORDER — OXYTOCIN 40 UNITS IN NORMAL SALINE INFUSION - SIMPLE MED
INTRAVENOUS | Status: AC
  Filled 2019-07-02: qty 3000

## 2019-07-02 MED ORDER — LACTATED RINGERS IV SOLN: 500.0000 mL | INTRAVENOUS | Status: DC | PRN

## 2019-07-02 MED ORDER — GLYCOPYRROLATE PF 0.2 MG/ML IJ SOSY
PREFILLED_SYRINGE | INTRAMUSCULAR | Status: AC
  Filled 2019-07-02: qty 1

## 2019-07-02 MED ORDER — EPHEDRINE 5 MG/ML INJ: 10.0000 mg | INTRAVENOUS | Status: DC | PRN

## 2019-07-02 MED ORDER — PHENYLEPHRINE HCL-NACL 20-0.9 MG/250ML-% IV SOLN
INTRAVENOUS | Status: DC | PRN
  Administered 2019-07-02: 30 ug/min via INTRAVENOUS

## 2019-07-02 MED ORDER — KETAMINE HCL 50 MG/5ML IJ SOSY
PREFILLED_SYRINGE | INTRAMUSCULAR | Status: AC
  Filled 2019-07-02: qty 5

## 2019-07-02 MED ORDER — PHENYLEPHRINE HCL (PRESSORS) 10 MG/ML IV SOLN
INTRAVENOUS | Status: DC | PRN
  Administered 2019-07-02 (×2): 80 ug via INTRAVENOUS
  Administered 2019-07-02: 120 ug via INTRAVENOUS

## 2019-07-02 MED ORDER — OXYCODONE HCL 5 MG PO TABS: 5.0000 mg | ORAL_TABLET | Freq: Once | ORAL | Status: DC | PRN

## 2019-07-02 MED ORDER — KETOROLAC TROMETHAMINE 30 MG/ML IJ SOLN
30.0000 mg | Freq: Four times a day (QID) | INTRAMUSCULAR | Status: DC
  Administered 2019-07-02 – 2019-07-03 (×3): 30 mg via INTRAVENOUS
  Filled 2019-07-02 (×3): qty 1

## 2019-07-02 MED ORDER — PRENATAL MULTIVITAMIN CH
1.0000 | ORAL_TABLET | Freq: Every day | ORAL | Status: DC
  Administered 2019-07-03 – 2019-07-05 (×3): 1 via ORAL
  Filled 2019-07-02 (×3): qty 1

## 2019-07-02 MED ORDER — KETOROLAC TROMETHAMINE 30 MG/ML IJ SOLN: 30.0000 mg | Freq: Once | INTRAMUSCULAR | Status: DC | PRN

## 2019-07-02 MED ORDER — PHENYLEPHRINE HCL-NACL 20-0.9 MG/250ML-% IV SOLN
INTRAVENOUS | Status: AC
  Filled 2019-07-02: qty 500

## 2019-07-02 MED ORDER — OXYTOCIN 40 UNITS IN NORMAL SALINE INFUSION - SIMPLE MED
INTRAVENOUS | Status: AC
  Filled 2019-07-02: qty 1000

## 2019-07-02 MED ORDER — KETAMINE HCL 10 MG/ML IJ SOLN
INTRAMUSCULAR | Status: DC | PRN
  Administered 2019-07-02 (×2): 10 mg via INTRAVENOUS

## 2019-07-02 MED ORDER — METOCLOPRAMIDE HCL 5 MG/ML IJ SOLN
10.0000 mg | Freq: Four times a day (QID) | INTRAMUSCULAR | Status: DC
  Administered 2019-07-02 (×2): 10 mg via INTRAVENOUS
  Filled 2019-07-02 (×2): qty 2

## 2019-07-02 MED ORDER — DIBUCAINE (PERIANAL) 1 % EX OINT: 1.0000 "application " | TOPICAL_OINTMENT | CUTANEOUS | Status: DC | PRN

## 2019-07-02 MED ORDER — DIPHENHYDRAMINE HCL 25 MG PO CAPS: 25.0000 mg | ORAL_CAPSULE | ORAL | Status: DC | PRN

## 2019-07-02 MED ORDER — ENOXAPARIN SODIUM 60 MG/0.6ML ~~LOC~~ SOLN
60.0000 mg | SUBCUTANEOUS | Status: DC
  Administered 2019-07-04 – 2019-07-05 (×2): 60 mg via SUBCUTANEOUS
  Filled 2019-07-02 (×3): qty 0.6

## 2019-07-02 MED ORDER — ACETAMINOPHEN 325 MG PO TABS: 650.0000 mg | ORAL_TABLET | ORAL | Status: DC | PRN

## 2019-07-02 MED ORDER — SIMETHICONE 80 MG PO CHEW
80.0000 mg | CHEWABLE_TABLET | Freq: Three times a day (TID) | ORAL | Status: DC
  Administered 2019-07-03 – 2019-07-05 (×8): 80 mg via ORAL
  Filled 2019-07-02 (×8): qty 1

## 2019-07-02 MED ORDER — SODIUM CHLORIDE 0.9 % IV SOLN
INTRAVENOUS | Status: DC | PRN
  Administered 2019-07-02: 40 [IU] via INTRAVENOUS

## 2019-07-02 MED ORDER — INSULIN REGULAR(HUMAN) IN NACL 100-0.9 UT/100ML-% IV SOLN
INTRAVENOUS | Status: DC
  Administered 2019-07-02: 1.5 [IU]/h via INTRAVENOUS
  Filled 2019-07-02 (×2): qty 100

## 2019-07-02 MED ORDER — LACTATED RINGERS IV SOLN
INTRAVENOUS | Status: DC
  Administered 2019-07-03: 05:00:00 via INTRAVENOUS

## 2019-07-02 MED ORDER — ACETAMINOPHEN 10 MG/ML IV SOLN
INTRAVENOUS | Status: AC
  Filled 2019-07-02: qty 100

## 2019-07-02 MED ORDER — FENTANYL CITRATE (PF) 250 MCG/5ML IJ SOLN
INTRAMUSCULAR | Status: AC
  Filled 2019-07-02: qty 5

## 2019-07-02 MED ORDER — PROPOFOL 10 MG/ML IV BOLUS
INTRAVENOUS | Status: DC | PRN
  Administered 2019-07-02: 50 mg via INTRAVENOUS
  Administered 2019-07-02: 200 mg via INTRAVENOUS

## 2019-07-02 MED ORDER — SODIUM CHLORIDE 0.9% FLUSH
3.0000 mL | INTRAVENOUS | Status: DC | PRN
  Administered 2019-07-04: 11:00:00 3 mL via INTRAVENOUS
  Filled 2019-07-02: qty 3

## 2019-07-02 MED ORDER — MAGNESIUM SULFATE 40 G IN LACTATED RINGERS - SIMPLE
INTRAVENOUS | Status: AC
  Filled 2019-07-02: qty 500

## 2019-07-02 MED ORDER — INSULIN NPH (HUMAN) (ISOPHANE) 100 UNIT/ML ~~LOC~~ SUSP
88.0000 [IU] | Freq: Every day | SUBCUTANEOUS | Status: DC
  Filled 2019-07-02: qty 10

## 2019-07-02 MED ORDER — MAGNESIUM SULFATE BOLUS VIA INFUSION
4.0000 g | Freq: Once | INTRAVENOUS | Status: AC
  Administered 2019-07-02: 4 g via INTRAVENOUS

## 2019-07-02 MED ORDER — DIPHENHYDRAMINE HCL 25 MG PO CAPS: 25.0000 mg | ORAL_CAPSULE | Freq: Four times a day (QID) | ORAL | Status: DC | PRN

## 2019-07-02 MED ORDER — ONDANSETRON HCL 4 MG/2ML IJ SOLN
INTRAMUSCULAR | Status: AC
  Filled 2019-07-02: qty 2

## 2019-07-02 MED ORDER — OXYCODONE-ACETAMINOPHEN 5-325 MG PO TABS: 2.0000 | ORAL_TABLET | ORAL | Status: DC | PRN

## 2019-07-02 MED ORDER — INSULIN ASPART 100 UNIT/ML ~~LOC~~ SOLN: 44.0000 [IU] | Freq: Three times a day (TID) | SUBCUTANEOUS | Status: DC

## 2019-07-02 MED ORDER — SIMETHICONE 80 MG PO CHEW
80.0000 mg | CHEWABLE_TABLET | ORAL | Status: DC
  Administered 2019-07-02 – 2019-07-04 (×3): 80 mg via ORAL
  Filled 2019-07-02 (×3): qty 1

## 2019-07-02 MED ORDER — LABETALOL HCL 5 MG/ML IV SOLN: 20.0000 mg | INTRAVENOUS | Status: DC | PRN

## 2019-07-02 MED ORDER — NALOXONE HCL 4 MG/10ML IJ SOLN
1.0000 ug/kg/h | INTRAVENOUS | Status: DC | PRN
  Filled 2019-07-02: qty 5

## 2019-07-02 MED ORDER — ACETAMINOPHEN 10 MG/ML IV SOLN
INTRAVENOUS | Status: DC | PRN
  Administered 2019-07-02: 1000 mg via INTRAVENOUS

## 2019-07-02 MED ORDER — PHENYLEPHRINE HCL-NACL 20-0.9 MG/250ML-% IV SOLN
INTRAVENOUS | Status: AC
  Filled 2019-07-02: qty 250

## 2019-07-02 SURGICAL SUPPLY — 39 items
BENZOIN TINCTURE PRP APPL 2/3 (GAUZE/BANDAGES/DRESSINGS) ×3 IMPLANT
CHLORAPREP W/TINT 26ML (MISCELLANEOUS) ×3 IMPLANT
CLAMP CORD UMBIL (MISCELLANEOUS) IMPLANT
CLIP FILSHIE TUBAL LIGA STRL (Clip) ×3 IMPLANT
CLOSURE WOUND 1/2 X4 (GAUZE/BANDAGES/DRESSINGS) ×1
CLOTH BEACON ORANGE TIMEOUT ST (SAFETY) ×3 IMPLANT
DRESSING PREVENA PLUS CUSTOM (GAUZE/BANDAGES/DRESSINGS) ×1 IMPLANT
DRSG OPSITE POSTOP 4X10 (GAUZE/BANDAGES/DRESSINGS) ×3 IMPLANT
DRSG PREVENA PLUS CUSTOM (GAUZE/BANDAGES/DRESSINGS) ×3
ELECT REM PT RETURN 9FT ADLT (ELECTROSURGICAL) ×3
ELECTRODE REM PT RTRN 9FT ADLT (ELECTROSURGICAL) ×1 IMPLANT
EXTRACTOR VACUUM M CUP 4 TUBE (SUCTIONS) IMPLANT
EXTRACTOR VACUUM M CUP 4' TUBE (SUCTIONS)
GLOVE BIOGEL PI IND STRL 7.0 (GLOVE) ×2 IMPLANT
GLOVE BIOGEL PI IND STRL 7.5 (GLOVE) ×2 IMPLANT
GLOVE BIOGEL PI INDICATOR 7.0 (GLOVE) ×4
GLOVE BIOGEL PI INDICATOR 7.5 (GLOVE) ×4
GLOVE ECLIPSE 7.5 STRL STRAW (GLOVE) ×3 IMPLANT
GOWN STRL REUS W/TWL LRG LVL3 (GOWN DISPOSABLE) ×9 IMPLANT
HEMOSTAT ARISTA ABSORB 3G PWDR (HEMOSTASIS) ×3 IMPLANT
HOVERMATT SINGLE USE (MISCELLANEOUS) ×3 IMPLANT
KIT ABG SYR 3ML LUER SLIP (SYRINGE) IMPLANT
NEEDLE HYPO 25X5/8 SAFETYGLIDE (NEEDLE) IMPLANT
NS IRRIG 1000ML POUR BTL (IV SOLUTION) ×3 IMPLANT
PACK C SECTION WH (CUSTOM PROCEDURE TRAY) ×3 IMPLANT
PAD OB MATERNITY 4.3X12.25 (PERSONAL CARE ITEMS) ×3 IMPLANT
PENCIL SMOKE EVAC W/HOLSTER (ELECTROSURGICAL) ×3 IMPLANT
RETRACTOR TRAXI PANNICULUS (MISCELLANEOUS) ×1 IMPLANT
RTRCTR C-SECT PINK 25CM LRG (MISCELLANEOUS) ×3 IMPLANT
STRIP CLOSURE SKIN 1/2X4 (GAUZE/BANDAGES/DRESSINGS) ×2 IMPLANT
SUT VIC AB 0 CTX 36 (SUTURE) ×6
SUT VIC AB 0 CTX36XBRD ANBCTRL (SUTURE) ×3 IMPLANT
SUT VIC AB 2-0 CT1 27 (SUTURE) ×2
SUT VIC AB 2-0 CT1 TAPERPNT 27 (SUTURE) ×1 IMPLANT
SUT VIC AB 4-0 KS 27 (SUTURE) ×3 IMPLANT
TOWEL OR 17X24 6PK STRL BLUE (TOWEL DISPOSABLE) ×3 IMPLANT
TRAXI PANNICULUS RETRACTOR (MISCELLANEOUS) ×2
TRAY FOLEY W/BAG SLVR 14FR LF (SET/KITS/TRAYS/PACK) ×3 IMPLANT
WATER STERILE IRR 1000ML POUR (IV SOLUTION) ×3 IMPLANT

## 2019-07-02 NOTE — Discharge Summary (Signed)
Postpartum Discharge Summary     Patient Name: Anna Jordan DOB: 22-Aug-1991 MRN: [id number]  Date of admission: 07/01/2019 Delivering Provider: Levie HeritageSTINSON, JACOB J   Date of discharge: 07/05/2019  Admitting diagnosis: HBP Intrauterine pregnancy: 3273w1d     Secondary diagnosis:  Active Problems:   Pre-eclampsia affecting pregnancy, antepartum   Preeclampsia, severe, third trimester  Additional problems:  Primary CS for breech presentation Uncontrolled GDM Anxiety     Discharge diagnosis: Preeclampsia (severe), Type 2 DM and OB Hemorrhage                                                                                                Post partum procedures:postpartum tubal ligation  Augmentation: n/a  Complications: Hemorrhage>105600mL  Hospital course:  Sceduled C/S   [age] y.o. yo [id number] at 2473w1d was admitted to the hospital 07/01/2019 for scheduled cesarean section with the following indication:Malpresentation with failed ECV in setting of pre-eclampsia with severe features and uncontrolled GDM.  Membrane Rupture Time/Date: 5:39 PM ,07/02/2019   Patient delivered a Viable infant.07/02/2019  Details of operation can be found in separate operative note. Pt received magnesium x 24 hrs. BP remained in good range without mediations afterwards. She was started no Metformin 500 mg bid and CBG's were in the 130's.    She is ambulating, tolerating a regular diet, passing flatus, and urinating well. Patient is discharged home in stable condition on  07/05/19         Magnesium Sulfate recieved: Yes BMZ received: No  Physical exam  Vitals:   07/04/19 2009 07/05/19 0019 07/05/19 0531 07/05/19 0803  BP: 114/61 (!) 115/50 (!) 107/48 125/69  Pulse: 98 (!) 120 87 98  Resp: 18 18 18 18   Temp: 98.2 F (36.8 C) 98 F (36.7 C) 98 F (36.7 C) 98.4 F (36.9 C)  TempSrc: Oral Oral Oral Oral  SpO2: 98% 99% 98% 98%  Weight:      Height:       General: alert Lochia: appropriate Uterine Fundus:  firm Incision: Healing well with no significant drainage DVT Evaluation: No evidence of DVT seen on physical exam. Labs: Lab Results  Component Value Date   WBC 11.7 (H) 07/03/2019   HGB 10.1 (L) 07/03/2019   HCT 31.6 (L) 07/03/2019   MCV 88.3 07/03/2019   PLT 203 07/03/2019   CMP Latest Ref Rng & Units 07/02/2019  Glucose 70 - 99 mg/dL 147(W204(H)  BUN 6 - 20 mg/dL 5(L)  Creatinine 2.950.44 - 1.00 mg/dL 6.210.57  Sodium 308135 - 657145 mmol/L 135  Potassium 3.5 - 5.1 mmol/L 3.6  Chloride 98 - 111 mmol/L 109  CO2 22 - 32 mmol/L 16(L)  Calcium 8.9 - 10.3 mg/dL 8.4(O8.4(L)  Total Protein 6.5 - 8.1 g/dL 9.6(E5.6(L)  Total Bilirubin 0.3 - 1.2 mg/dL 0.3  Alkaline Phos 38 - 126 U/L 108  AST 15 - 41 U/L 15  ALT 0 - 44 U/L 21    Discharge instruction: per After Visit Summary and "Baby and Me Booklet".  After visit meds:  Allergies as of 07/05/2019      Reactions  Penicillins Swelling   Did it involve swelling of the face/tongue/throat, SOB, or low BP? Yes Did it involve sudden or severe rash/hives, skin peeling, or any reaction on the inside of your mouth or nose? No Did you need to seek medical attention at a hospital or doctor's office? Yes When did it last happen?Childhood If all above answers are "NO", may proceed with cephalosporin use.   Hydrocodone Hives   Morphine And Related Hives   Tramadol Hives      Medication List    STOP taking these medications   acetaminophen 325 MG tablet Commonly known as: TYLENOL   glucose blood test strip   insulin aspart 100 UNIT/ML injection Commonly known as: novoLOG   insulin NPH Human 100 UNIT/ML injection Commonly known as: NOVOLIN N   Insulin Syringes (Disposable) U-100 1 ML Misc     TAKE these medications   Accu-Chek Aviva device Use as instructed   Accu-Chek FastClix Lancets Misc 1 Device by Percutaneous route 4 (four) times daily.   albuterol 108 (90 Base) MCG/ACT inhaler Commonly known as: VENTOLIN HFA Inhale 2 puffs into the lungs  every 6 (six) hours as needed for wheezing or shortness of breath.   ibuprofen 800 MG tablet Commonly known as: ADVIL Take 1 tablet (800 mg total) by mouth 3 (three) times daily.   metFORMIN 500 MG tablet Commonly known as: GLUCOPHAGE Take 1 tablet (500 mg total) by mouth 2 (two) times daily with a meal.   multivitamin-prenatal 27-0.8 MG Tabs tablet Take 1 tablet by mouth daily at 12 noon.   omeprazole 20 MG capsule Commonly known as: PriLOSEC Take 1 capsule (20 mg total) by mouth daily.   oxyCODONE-acetaminophen 5-325 MG tablet Commonly known as: PERCOCET/ROXICET Take 1 tablet by mouth every 4 (four) hours as needed for moderate pain or severe pain.            Discharge Care Instructions  (From admission, onward)         Start     Ordered   07/05/19 0000  Discharge wound care:    Comments: Remove Prevena on 07/07/19   07/05/19 1125          Diet: carb modified diet  Activity: Advance as tolerated. Pelvic rest for 6 weeks.   Outpatient follow up:1 week Follow up Appt: Future Appointments  Date Time Provider Department Center  07/11/2019  4:15 PM Willodean RosenthalHarraway-Smith, Carolyn, MD [facility]   Follow up Visit: Follow-up Information    [facility].   Specialty: Obstetrics and Gynecology Why: 1 week for BP check and incision check 4 weeks for PP visit Contact information: [address], [address] [place] [id number] [phone]           Please schedule this patient for Postpartum visit in: 1 week with the following provider: MD  For C/S patients schedule nurse incision check in weeks 2 weeks: yes  High risk pregnancy complicated by: uncontrolled GDM, Pre-eclampsia without severe features  Delivery mode: CS  Anticipated Birth Control: BTL done PP  PP Procedures needed: wound check in 1 week, BP check, incision check,   Schedule Integrated BH visit: yes       Newborn  Data: Live born female  Birth Weight: 8 lb 2.2 oz (3690 g) APGAR: 4, 8  Newborn Delivery   Birth date/time: 07/02/2019 17:40:00 Delivery type: C-Section, Low Transverse Trial of labor: No C-section categorization: Primary      Baby  Feeding: Breast Disposition:NICU   07/05/2019 Chancy Milroy, MD

## 2019-07-02 NOTE — Consult Note (Addendum)
Neonatology Note:   Attendance at C-section:    I was asked by Dr. Nehemiah Settle to attend this C/S at 35.1 weeks for pre-eclampsia with transverse lie. Failed rotation under anesthesia. Reassuring fetal status. The mother is a [id number], GBS + with good prenatal care complicated by L9FX on insulin and obesity, and psych disorders.  Mat 21 wnl. ROM at delivery, fluid clear. Infant not vigorous nor with good spontaneous cry and tone. Infant immediately brought to warmer and assessed.  HR <100 and dropping with limited tone and resp effort.  PPV started with slow improvement in HR.  PPV stopped after 1-2 in and cpap continued.  Sao2 monitored throughout.  By 23min of life, unable to wean CPAP off and baby with continued mild fio2 need.  Ap 4/8. Lungs clearing to ausc in DR on CPAP 6 40%. To NICU for care of late preterm baby with RDS. FOB accompanied [place] to NICU.    Monia Sabal Katherina Mires, MD

## 2019-07-02 NOTE — Anesthesia Procedure Notes (Signed)
Epidural Patient location during procedure: OB Start time: 07/02/2019 2:50 PM End time: 07/02/2019 3:20 PM  Staffing Anesthesiologist: Freddrick March, MD Performed: anesthesiologist   Preanesthetic Checklist Completed: patient identified, pre-op evaluation, timeout performed, IV checked, risks and benefits discussed and monitors and equipment checked  Epidural Patient position: sitting Prep: site prepped and draped and DuraPrep Patient monitoring: continuous pulse ox, blood pressure, heart rate and cardiac monitor Approach: midline Location: L3-L4 Injection technique: LOR air  Needle:  Needle type: Tuohy  Needle gauge: 17 G Needle length: 9 cm Needle insertion depth: 10 cm Catheter type: closed end flexible Catheter size: 19 Gauge Catheter at skin depth: 16 cm Test dose: negative  Assessment Sensory level: T8 Events: blood not aspirated, injection not painful, no injection resistance, negative IV test and no paresthesia  Additional Notes Patient identified. Risks/Benefits/Options discussed with patient including but not limited to bleeding, infection, nerve damage, paralysis, failed block, incomplete pain control, headache, blood pressure changes, nausea, vomiting, reactions to medication both or allergic, itching and postpartum back pain. Confirmed with bedside nurse the patient's most recent platelet count. Confirmed with patient that they are not currently taking any anticoagulation, have any bleeding history or any family history of bleeding disorders. Patient expressed understanding and wished to proceed. All questions were answered. Sterile technique was used throughout the entire procedure. Please see nursing notes for vital signs. Test dose was given through epidural catheter and negative prior to continuing to dose epidural or start infusion. Warning signs of high block given to the patient including shortness of breath, tingling/numbness in hands, complete motor block,  or any concerning symptoms with instructions to call for help. Patient was given instructions on fall risk and not to get out of bed. All questions and concerns addressed with instructions to call with any issues or inadequate analgesia.  Reason for block:procedure for pain

## 2019-07-02 NOTE — Addendum Note (Signed)
Addended by: Asencion Islam on: 07/02/2019 08:36 AM   Modules accepted: Orders

## 2019-07-02 NOTE — Progress Notes (Signed)
Patient ID: Donnae Michels, female   DOB: 12/17/1990, 28 y.o.   MRN: [id number]  Bedside US done - baby transverse, head maternal right. Consented patient for external cephalic version. Will do under anesthesia. Discussed risk of abruption, fetal intolerance, ROM, pain, fetal bradycardia, need for emergent cesarean delivery.   Also discussed the risks of cesarean section: bleeding which may require transfusion or reoperation; infection which may require antibiotics; injury to bowel, bladder, ureters or other surrounding organs; injury to the fetus; need for additional procedures including hysterectomy in the event of a life-threatening hemorrhage; placental abnormalities wth subsequent pregnancies, incisional problems, thromboembolic phenomenon and other postoperative/anesthesia complications. The patient concurred with the proposed plan, giving informed written consent for the procedure.     Truett Mainland, DO 07/02/2019 2:31 PM

## 2019-07-02 NOTE — H&P (Addendum)
Obstetric History and Physical  Anna Jordan is a 28 y.o. [id number] with IUP at 42w1dpresenting for evaluation for pre-eclampsia, [facility] Known mild pre-eclampsia. Patient states she has been having Braxton-Hicks contractions, denies leaking or bleeding, normal fetal movement.   Prenatal Course Source of Care: [facility] with onset of care at 10 weeks Pregnancy complications or risks: Patient Active Problem List   Diagnosis Date Noted  . Gestational hypertension 07/01/2019  . Poorly controlled type 2 diabetes mellitus ([place] 06/24/2019  . History of shoulder dystocia in prior pregnancy, currently pregnant 03/25/2019  . Late Entry to [employer]- March 2020- Social Distancing 02/12/2019  . Hx of pre-eclampsia in prior pregnancy, currently pregnant 01/16/2019  . Supervision of high-risk pregnancy 01/16/2019  . Type 2 diabetes mellitus affecting pregnancy, antepartum 01/16/2019  . Maternal morbid obesity, antepartum ([place] 07/18/2018  . Lumbar facet joint pain 07/18/2018  . 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 [place]roiliitis ([place] 07/18/2018  . Mild intermittent asthma with acute exacerbation 10/12/2017  . Generalized anxiety disorder 10/24/2016  . History of vertebral compression fracture 10/24/2016   Prenatal labs and studies: ABO, Rh: --/--/O POS (08/15 2131) Antibody: NEG (08/15 2131) Rubella: <0.90 (03/03 0906) RPR: NON-REACTIVE (03/03 0906)  HBsAg: NON-REACTIVE (03/03 0906)  HIV: NON-REACTIVE (03/03 0906)  GBS:  2 hr GTT:  elevated Genetic screening normal Anatomy [place]ormal  Medical History:  Past Medical History:  Diagnosis Date  . Anxiety   . Class 3 severe obesity due to excess calories with body mass index (BMI) of 50.0 to 59.9 in adult ([place] 07/18/2018  . Depression   . Gestational diabetes   . Hypertension   . Obesity   . Schizophrenia ([facility]     Past Surgical History:  Procedure Laterality Date  . GALLBLADDER  SURGERY  08/2018  . WISDOM TOOTH EXTRACTION      OB History  Gravida Para Term Preterm AB Living  '5 2 1 1 2 2  ' SAB TAB Ectopic Multiple Live Births  2       2    # Outcome Date GA Lbr Len/2nd Weight Sex Delivery Anes PTL Lv  5 Current           4 Preterm 09/27/17 [id number] g M Vag-Spont EPI N LIV  3 Term 08/08/11   4281 g M Vag-Spont   LIV  2 SAB           1 SAB             Social History   Socioeconomic History  . Marital status: Married    Spouse name: Not on file  . Number of children: Not on file  . Years of education: Not on file  . Highest education level: Not on file  Occupational History  . Occupation: homemaker  Social Needs  . Financial resource strain: Not on file  . Food insecurity    Worry: Not on file    Inability: Not on file  . Transportation needs    Medical: Not on file    Non-medical: Not on file  Tobacco Use  . Smoking status: Former Smoker    Packs/day: 0.50    Years: 15.00    Pack years: 7.50    Types: Cigarettes    Quit date: 03/31/2017    Years since quitting: 2.2  . Smokeless tobacco: Never Used  . Tobacco comment: Quit previously x 1 year, Prior to quittting this time, smoked  0.5-1ppd  Substance and Sexual Activity  . Alcohol use: No  . Drug use: No  . Sexual activity: Yes    Partners: Male    Birth control/protection: None  Lifestyle  . Physical activity    Days per week: Not on file    Minutes per session: Not on file  . Stress: Not on file  Relationships  . Social Herbalist on phone: Not on file    Gets together: Not on file    Attends religious service: Not on file    Active member of club or organization: Not on file    Attends meetings of clubs or organizations: Not on file    Relationship status: Not on file  Other Topics Concern  . Not on file  Social History Narrative  . Not on file    Family History  Problem Relation Age of Onset  . Depression Mother   . Diabetes Mother   . Heart disease Mother    . Stroke Father   . Hypertension Father   . Breast cancer Maternal Aunt        4 mat aunts with breast cancer  . Diabetes Brother   . Breast cancer Maternal Grandmother   . Breast cancer Paternal Grandmother   . Breast cancer Paternal Grandfather     Facility-Administered Medications Prior to Admission  Medication Dose Route Frequency Provider Last Rate Last Dose  . insulin starter kit- syringes (English) 1 kit  1 kit Other Once [place], Wilhemina Cash, MD       Medications Prior to Admission  Medication Sig Dispense Refill Last Dose  . Accu-Chek FastClix Lancets MISC 1 Device by Percutaneous route 4 (four) times daily. 100 each 12   . albuterol (PROVENTIL HFA;VENTOLIN HFA) 108 (90 Base) MCG/ACT inhaler Inhale 2 puffs into the lungs every 6 (six) hours as needed for wheezing or shortness of breath.     Marland Kitchen aspirin EC 81 MG tablet Take 1 tablet (81 mg total) by mouth daily. Take after 12 weeks for prevention of preeclampsia later in pregnancy 300 tablet 0   . Blood Glucose Monitoring Suppl (ACCU-CHEK AVIVA) device Use as instructed 1 each 0   . glucose blood test strip Use to check CBG's QID 100 each 12   . insulin aspart (NOVOLOG) 100 UNIT/ML injection Inject 44 units into the skin prior to each meal 10 mL 11   . insulin NPH Human (NOVOLIN N) 100 UNIT/ML injection Inject 0.55 mLs (55 Units total) into the skin daily before breakfast. 10 mL 11   . insulin NPH Human (NOVOLIN N) 100 UNIT/ML injection Inject 88 units into skin before bedtime. 10 mL 11   . Insulin Syringes, Disposable, U-100 1 ML MISC 1 Syringe by Does not apply route 3 (three) times daily. 100 each 12   . loratadine (CLARITIN) 10 MG tablet Take 10 mg by mouth daily.     Marland Kitchen omeprazole (PRILOSEC) 20 MG capsule Take 1 capsule (20 mg total) by mouth daily. 30 capsule 4   . ondansetron (ZOFRAN) 8 MG tablet Take 1 tablet (8 mg total) by mouth every 8 (eight) hours as needed for nausea or vomiting. 30 tablet 2   . Prenatal Vit-Fe Fumarate-FA  (MULTIVITAMIN-PRENATAL) 27-0.8 MG TABS tablet Take 1 tablet by mouth daily at 12 noon. 30 tablet 10   . tiZANidine (ZANAFLEX) 4 MG tablet Take 4 mg by mouth every 8 (eight) hours as needed for muscle spasms.  1  Allergies  Allergen Reactions  . Hydrocodone Hives  . Tramadol Hives    Review of Systems: Negative except for what is mentioned in HPI.  Physical Exam: BP 127/78 (BP Location: Right Arm)   Pulse (!) 113   Temp 98 F (36.7 C) (Oral)   Resp 16   Ht '5\' 3"'  (1.6 m)   LMP 10/29/2018   SpO2 99%   BMI 46.41 kg/m  CONSTITUTIONAL: Well-developed, well-nourished female in mild distress, appears very fatigued, morbidly obese HENT:  Normocephalic, atraumatic, External right and left ear normal. Oropharynx is clear and moist EYES: Conjunctivae and EOM are normal. Pupils are equal, round, and reactive to light. No scleral icterus.  NECK: Normal range of motion, supple, no masses SKIN: Skin is warm and dry. No rash noted. Not diaphoretic. No erythema. No pallor. NEUROLOGIC: Alert and oriented to person, place, and time. Normal reflexes, muscle tone coordination. No cranial nerve deficit noted. PSYCHIATRIC: Normal mood and affect. Normal behavior. Normal judgment and thought content. CARDIOVASCULAR: Normal heart rate noted RESPIRATORY: Effort  normal, no problems with respiration noted ABDOMEN: Soft, nontender, nondistended, gravid. MUSCULOSKELETAL: Normal range of motion. No edema and no tenderness. 2+ distal pulses.    Pertinent Labs/Studies:   Results for orders placed or performed during the hospital encounter of 07/01/19 (from the past 24 hour(s))  Urinalysis, Routine w reflex microscopic     Status: Abnormal   Collection Time: 07/01/19  7:47 PM  Result Value Ref Range   Color, Urine YELLOW YELLOW   APPearance HAZY (A) CLEAR   Specific Gravity, Urine 1.025 1.005 - 1.030   pH 5.0 5.0 - 8.0   Glucose, UA 150 (A) NEGATIVE mg/dL   Hgb urine dipstick NEGATIVE NEGATIVE    Bilirubin Urine NEGATIVE NEGATIVE   Ketones, ur 80 (A) NEGATIVE mg/dL   Protein, ur 30 (A) NEGATIVE mg/dL   Nitrite NEGATIVE NEGATIVE   Leukocytes,Ua NEGATIVE NEGATIVE   RBC / HPF 0-5 0 - 5 RBC/hpf   WBC, UA 0-5 0 - 5 WBC/hpf   Bacteria, UA NONE SEEN NONE SEEN   Squamous Epithelial / LPF 0-5 0 - 5   Mucus PRESENT    Ca Oxalate Crys, UA PRESENT   Protein / creatinine ratio, urine     Status: Abnormal   Collection Time: 07/01/19  7:47 PM  Result Value Ref Range   Creatinine, Urine 108.69 mg/dL   Total Protein, Urine 40 mg/dL   Protein Creatinine Ratio 0.37 (H) 0.00 - 0.15 mg/mg[Cre]  CBC with Differential/Platelet     Status: Abnormal   Collection Time: 07/01/19  8:58 PM  Result Value Ref Range   WBC 10.1 4.0 - 10.5 K/uL   RBC 4.01 3.87 - 5.11 MIL/uL   Hemoglobin 11.3 (L) 12.0 - 15.0 g/dL   HCT 35.3 (L) 36.0 - 46.0 %   MCV 88.0 80.0 - 100.0 fL   MCH 28.2 26.0 - 34.0 pg   MCHC 32.0 30.0 - 36.0 g/dL   RDW 15.8 (H) 11.5 - 15.5 %   Platelets 188 150 - 400 K/uL   nRBC 0.2 0.0 - 0.2 %   Neutrophils Relative % 69 %   Neutro Abs 7.0 1.7 - 7.7 K/uL   Lymphocytes Relative 21 %   Lymphs Abs 2.1 0.7 - 4.0 K/uL   Monocytes Relative 5 %   Monocytes Absolute 0.5 0.1 - 1.0 K/uL   Eosinophils Relative 1 %   Eosinophils Absolute 0.1 0.0 - 0.5 K/uL   Basophils Relative 0 %  Basophils Absolute 0.0 0.0 - 0.1 K/uL   Immature Granulocytes 4 %   Abs Immature Granulocytes 0.36 (H) 0.00 - 0.07 K/uL  Comprehensive metabolic panel     Status: Abnormal   Collection Time: 07/01/19  8:58 PM  Result Value Ref Range   Sodium 134 (L) 135 - 145 mmol/L   Potassium 3.5 3.5 - 5.1 mmol/L   Chloride 105 98 - 111 mmol/L   CO2 15 (L) 22 - 32 mmol/L   Glucose, Bld 188 (H) 70 - 99 mg/dL   BUN 7 6 - 20 mg/dL   Creatinine, Ser 0.79 0.44 - 1.00 mg/dL   Calcium 9.0 8.9 - 10.3 mg/dL   Total Protein 5.5 (L) 6.5 - 8.1 g/dL   Albumin 2.2 (L) 3.5 - 5.0 g/dL   AST 17 15 - 41 U/L   ALT 20 0 - 44 U/L   Alkaline  Phosphatase 97 38 - 126 U/L   Total Bilirubin 0.7 0.3 - 1.2 mg/dL   GFR calc non Af Amer >60 >60 mL/min   GFR calc Af Amer >60 >60 mL/min   Anion gap 14 5 - 15  SARS Coronavirus 2 [facility] order, Performed in [place] hospital lab) Nasopharyngeal Nasopharyngeal Swab     Status: None   Collection Time: 07/01/19 11:54 PM   Specimen: Nasopharyngeal Swab  Result Value Ref Range   SARS Coronavirus 2 NEGATIVE NEGATIVE    Assessment : Anna Jordan is a 28 y.o. [id number] at 34w1dbeing observed overnight for mild pre-eclampsia and poorly controlled GDMA2 on insulin. Developed headache in MAU that improved with reglan and benadryl. Then worsened again and improved with fioricet to the point that she was able to sleep. Severe headache returned this am with no improvement. She describes it as terrible 10/10 headache that is radiating down into her jaw, feels similar to headache she had when she had pre-eclampsia in her prior pregnancy. No relieved by second dose of benadryl/reglan. Normotensive, normal lab work. She appears very fatigued and as if she does not feel like herself. With new onset headache that is no longer relieved by meds, will consider her severe pre-eclampsia and plan for delivery. Patient is agreeable to that plan, answered all questions.   Patient is breech and desires version if possible. She understands that if she cannot be verted, she will need a c-section for delivery. She has been NPO since last night. Reviewed that with NICU census, if baby requires admission to NICU, the baby may be transferred and offered to transfer patient prior to delivery. She would like to stay here, aware of possibility that infant may require transfer. Answered all questions.   Pre-eclampsia with severe features - start MgSO4 - labs this am - for delivery  GDM - started on glucostablizer - CBG Q 1 hr for now - D5LR  FWB - cont monitoring - GBS PCR ordered this am   Remain NPO   K. MArvilla Meres M.D. Attending Center for [employer](Faculty Practice)  07/02/2019, 3:20 AM

## 2019-07-02 NOTE — Anesthesia Postprocedure Evaluation (Signed)
Anesthesia Post Note  Patient: Anna Jordan  Procedure(s) Performed: CESAREAN SECTION (N/A )     Patient location during evaluation: Mother Baby Anesthesia Type: Epidural Level of consciousness: awake and alert Pain management: pain level controlled Vital Signs Assessment: post-procedure vital signs reviewed and stable Respiratory status: spontaneous breathing, nonlabored ventilation and respiratory function stable Cardiovascular status: stable Postop Assessment: no headache, no backache and epidural receding Anesthetic complications: no    Last Vitals:  Vitals:   07/02/19 1930 07/02/19 2049  BP:    Pulse:    Resp:    Temp: 37.3 C 36.8 C  SpO2:      Last Pain:  Vitals:   07/02/19 2048  TempSrc:   PainSc: 7    Pain Goal: Patients Stated Pain Goal: 4 (07/02/19 0800)                 Lynda Rainwater

## 2019-07-02 NOTE — Progress Notes (Signed)
Patient to be moved to labor and delivery.  Report given to [employer] and Cecelia Byars.  Patient transported to room 215 via wheelchair.

## 2019-07-02 NOTE — Procedures (Signed)
After informed verbal consent, Terbutaline 0.25 mg SQ given, ECV was attempted under Ultrasound guidance.  Counter clockwise roll attempted twice and was successful, but baby rotated back to transverse position. Discontinued after baby continued to rotate back to transeverse position. Will proceed with cesarean delivery for unstable lie. FHR was reactive before and after the procedure.   Pt. Tolerated the procedure well.  Truett Mainland, DO 07/02/2019, 4:49 PM

## 2019-07-02 NOTE — Op Note (Addendum)
Anna Jordan PROCEDURE DATE: 07/02/2019  PREOPERATIVE DIAGNOSIS: Intrauterine pregnancy at  6764w1d weeks gestation; malpresentation: breech and pre-eclampsia with severe features, undesired fertility  POSTOPERATIVE DIAGNOSIS: The same  PROCEDURE: Primary Low Transverse Cesarean Section, bilateral tubal ligation using Filshie clips  SURGEON:  Dr. Candelaria Jordan   ASSISTANT: Dr Anna Jordan  INDICATIONS: Anna Jordan is a 28 y.o. [id number] at 5964w1d scheduled for cesarean section secondary to breech presentation in the setting of preeclampsia with severe features and uncontrolled GDM.  The risks of cesarean section discussed with the patient included but were not limited to: bleeding which may require transfusion or reoperation; infection which may require antibiotics; injury to bowel, bladder, ureters or other surrounding organs; injury to the fetus; need for additional procedures including hysterectomy in the event of a life-threatening hemorrhage; placental abnormalities wth subsequent pregnancies, incisional problems, thromboembolic phenomenon and other postoperative/anesthesia complications. Regarding sterilization risks and benefits of procedure discussed with patient including permanence of method, bleeding, infection, injury to surrounding organs and need for additional procedures. Risk failure of 0.5-1% with increased risk of ectopic gestation if pregnancy occurs was also discussed with patient.The patient concurred with the proposed plan, giving informed written consent for the procedure.    FINDINGS:  Viable female infant in transverse presentation.  Apgars 4 and 8, weight, 8 pounds and 2.2 ounces.  Clear amniotic fluid.  Intact placenta, three vessel cord.  Normal uterus, fallopian tubes and ovaries bilaterally.  ANESTHESIA:    Spinal INTRAVENOUS FLUIDS:1600 ml ESTIMATED BLOOD LOSS: 1300 ml URINE OUTPUT:  200 ml SPECIMENS: Placenta sent to L&D COMPLICATIONS: None  immediate  PROCEDURE IN DETAIL:  The patient received intravenous antibiotics and had sequential compression devices applied to her lower extremities while in the preoperative area.  She was then taken to the operating room where spinal anesthesia was attempted but was unable to be administered due to patient anxiety, so the decision was made to proceed with general anesthesia. She was then placed in a dorsal supine position with a leftward tilt, and prepped and draped in a sterile manner.  A foley catheter was placed into her bladder and attached to constant gravity, which drained clear fluid throughout. After an adequate timeout was performed, general anesthesia was administered and a Pfannenstiel skin incision was made with scalpel and carried through to the underlying layer of fascia. The fascia was incised in the midline and this incision was extended bluntly. The superior and inferior aspects of the rectus muscle were dissected off bluntly. The rectus muscles were separated in the midline bluntly and the peritoneum was entered bluntly. An Alexis retractor was placed to aid in visualization of the uterus.  Attention was turned to the lower uterine segment where a transverse hysterotomy was made with a scalpel and extended bilaterally bluntly. The infant was successfully delivered, and cord was clamped and cut and infant was handed over to awaiting neonatology team. Uterine massage was then administered and the placenta delivered intact with three-vessel cord. The uterus was then cleared of clot and debris.  The hysterotomy was closed with 0 Vicryl in a running locked fashion, and an imbricating layer was also placed with a 0 Vicryl. Arista was applied to the hysterotomy. Overall, excellent hemostasis was noted. Attention was then turned to the fallopian tubes, and a Filshie clip was placed on the left fallopian tube about 2 cm from the cornual attachment, with care given to incorporate the underlying  mesosalpinx.  A similar process was carried out on the rightl side allowing  for bilateral tubal sterilization. The abdomen and the pelvis were then cleared of all clot and debris and the Ubaldo Glassing was removed. Hemostasis was confirmed on all surfaces.  The peritoneum was reapproximated using 2-0 vicryl running stitches. The fascia was then closed using 0 Vicryl in a running fashion. The subcutaneous layer was reapproximated with plain gut and the skin was closed with 4-0 vicryl. The patient tolerated the procedure well. Sponge, lap, instrument and needle counts were correct x 2. She was taken to the recovery room in stable condition.    Anna Flock, MD 07/02/2019 6:46 PM

## 2019-07-02 NOTE — Anesthesia Preprocedure Evaluation (Signed)
Anesthesia Evaluation  Patient identified by MRN, date of birth, ID band Patient awake    Reviewed: Allergy & Precautions, NPO status , Patient's Chart, lab work & pertinent test results  Airway Mallampati: III  TM Distance: >3 FB Neck ROM: Full    Dental no notable dental hx.    Pulmonary neg pulmonary ROS, former smoker,    Pulmonary exam normal breath sounds clear to auscultation       Cardiovascular hypertension (preE with severe features on Mag), Normal cardiovascular exam Rhythm:Regular Rate:Normal     Neuro/Psych PSYCHIATRIC DISORDERS Anxiety Depression Schizophrenia negative neurological ROS     GI/Hepatic Neg liver ROS, GERD  Medicated,  Endo/Other  diabetes, Gestational, Insulin DependentMorbid obesity  Renal/GU negative Renal ROS  negative genitourinary   Musculoskeletal  (+) Arthritis ,   Abdominal   Peds  Hematology negative hematology ROS (+)   Anesthesia Other Findings Epidural for ECV, 35/1 gestational age  Reproductive/Obstetrics (+) Pregnancy                             Anesthesia Physical Anesthesia Plan  ASA: III  Anesthesia Plan: Epidural   Post-op Pain Management:    Induction:   PONV Risk Score and Plan: Treatment may vary due to age or medical condition  Airway Management Planned: Natural Airway  Additional Equipment:   Intra-op Plan:   Post-operative Plan:   Informed Consent: I have reviewed the patients History and Physical, chart, labs and discussed the procedure including the risks, benefits and alternatives for the proposed anesthesia with the patient or authorized representative who has indicated his/her understanding and acceptance.       Plan Discussed with: Anesthesiologist  Anesthesia Plan Comments: (Patient identified. Risks, benefits, options discussed with patient including but not limited to bleeding, infection, nerve damage,  paralysis, failed block, incomplete pain control, headache, blood pressure changes, nausea, vomiting, reactions to medication, itching, and post partum back pain. Confirmed with bedside nurse the patient's most recent platelet count. Confirmed with the patient that they are not taking any anticoagulation, have any bleeding history or any family history of bleeding disorders. Patient expressed understanding and wishes to proceed. All questions were answered. )        Anesthesia Quick Evaluation

## 2019-07-02 NOTE — Progress Notes (Signed)
Patient ID: Anna Jordan, female   DOB: [birth year], 28 y.o.   MRN: [id number] [facility] Attending  Discussed POC with pt. Pt to be transferred to L & D for placement of epidural, prior to ECV. Will proceed toward delivery after ECV via vaginal route or c section pending results. Will continue with magnesium and glucose stabilizer and NPO. Repeat labs at 15. Nursing staff, OR coordinator and anesthesia aware of POC. Pt verbalized understanding and agrees with POC.

## 2019-07-02 NOTE — Anesthesia Procedure Notes (Signed)
Procedure Name: Intubation Date/Time: 07/02/2019 5:37 PM Performed by: Asher Muir, CRNA Pre-anesthesia Checklist: Patient identified, Emergency Drugs available, Suction available and Patient being monitored Patient Re-evaluated:Patient Re-evaluated prior to induction Oxygen Delivery Method: Circle system utilized Preoxygenation: Pre-oxygenation with 100% oxygen Induction Type: IV induction and Cricoid Pressure applied Laryngoscope Size: Mac, 3 and Glidescope Grade View: Grade II Tube type: Oral (Woodrum intubating) Tube size: 7.0 mm Number of attempts: 1 Airway Equipment and Method: Patient positioned with wedge pillow,  Rigid stylet,  Video-laryngoscopy and Bite block Placement Confirmation: ETT inserted through vocal cords under direct vision,  positive ETCO2 and breath sounds checked- equal and bilateral Secured at: 22 cm Tube secured with: Tape Dental Injury: Teeth and Oropharynx as per pre-operative assessment

## 2019-07-02 NOTE — Transfer of Care (Signed)
Immediate Anesthesia Transfer of Care Note  Patient: Anna Jordan  Procedure(s) Performed: CESAREAN SECTION (N/A )  Patient Location: PACU  Anesthesia Type:General  Level of Consciousness: awake  Airway & Oxygen Therapy: Patient Spontanous Breathing and Patient connected to nasal cannula oxygen  Post-op Assessment: Report given to RN and Post -op Vital signs reviewed and stable  Post vital signs: stable  Last Vitals:  Vitals Value Taken Time  BP 143/65 07/02/19 1900  Temp    Pulse 113 07/02/19 1908  Resp 18 07/02/19 1908  SpO2 100 % 07/02/19 1908  Vitals shown include unvalidated device data.  Last Pain:  Vitals:   07/02/19 1414  TempSrc:   PainSc: 6       Patients Stated Pain Goal: 4 (67/67/20 9470)  Complications: No apparent anesthesia complications

## 2019-07-03 ENCOUNTER — Other Ambulatory Visit (HOSPITAL_COMMUNITY): Payer: Medicaid Other

## 2019-07-03 ENCOUNTER — Encounter (HOSPITAL_COMMUNITY): Payer: Self-pay | Admitting: Family Medicine

## 2019-07-03 ENCOUNTER — Ambulatory Visit (HOSPITAL_COMMUNITY): Payer: Medicaid Other

## 2019-07-03 LAB — CBC WITH DIFFERENTIAL/PLATELET
Abs Immature Granulocytes: 0.18 10*3/uL — ABNORMAL HIGH (ref 0.00–0.07)
Basophils Absolute: 0 10*3/uL (ref 0.0–0.1)
Basophils Relative: 0 %
Eosinophils Absolute: 0 10*3/uL (ref 0.0–0.5)
Eosinophils Relative: 0 %
HCT: 31.6 % — ABNORMAL LOW (ref 36.0–46.0)
Hemoglobin: 10.1 g/dL — ABNORMAL LOW (ref 12.0–15.0)
Immature Granulocytes: 2 %
Lymphocytes Relative: 18 %
Lymphs Abs: 2.1 10*3/uL (ref 0.7–4.0)
MCH: 28.2 pg (ref 26.0–34.0)
MCHC: 32 g/dL (ref 30.0–36.0)
MCV: 88.3 fL (ref 80.0–100.0)
Monocytes Absolute: 0.8 10*3/uL (ref 0.1–1.0)
Monocytes Relative: 6 %
Neutro Abs: 8.6 10*3/uL — ABNORMAL HIGH (ref 1.7–7.7)
Neutrophils Relative %: 74 %
Platelets: 203 10*3/uL (ref 150–400)
RBC: 3.58 MIL/uL — ABNORMAL LOW (ref 3.87–5.11)
RDW: 15.8 % — ABNORMAL HIGH (ref 11.5–15.5)
WBC: 11.7 10*3/uL — ABNORMAL HIGH (ref 4.0–10.5)
nRBC: 0 % (ref 0.0–0.2)

## 2019-07-03 LAB — GLUCOSE, CAPILLARY: Glucose-Capillary: 134 mg/dL — ABNORMAL HIGH (ref 70–99)

## 2019-07-03 MED ORDER — KETOROLAC TROMETHAMINE 30 MG/ML IJ SOLN
30.0000 mg | Freq: Four times a day (QID) | INTRAMUSCULAR | Status: AC
  Administered 2019-07-03: 30 mg via INTRAVENOUS
  Filled 2019-07-03: qty 1

## 2019-07-03 MED ORDER — OXYCODONE-ACETAMINOPHEN 5-325 MG PO TABS
1.0000 | ORAL_TABLET | ORAL | Status: DC | PRN
  Administered 2019-07-03: 1 via ORAL
  Filled 2019-07-03: qty 1

## 2019-07-03 MED ORDER — OXYCODONE HCL 5 MG PO TABS
5.0000 mg | ORAL_TABLET | Freq: Once | ORAL | Status: AC
  Administered 2019-07-03: 5 mg via ORAL
  Filled 2019-07-03: qty 1

## 2019-07-03 MED ORDER — OXYCODONE-ACETAMINOPHEN 5-325 MG PO TABS
1.0000 | ORAL_TABLET | ORAL | Status: DC | PRN
  Administered 2019-07-03 – 2019-07-05 (×10): 2 via ORAL
  Filled 2019-07-03 (×10): qty 2

## 2019-07-03 MED ORDER — IBUPROFEN 800 MG PO TABS
800.0000 mg | ORAL_TABLET | Freq: Three times a day (TID) | ORAL | Status: DC
  Administered 2019-07-03 – 2019-07-05 (×6): 800 mg via ORAL
  Filled 2019-07-03 (×6): qty 1

## 2019-07-03 MED ORDER — GABAPENTIN 100 MG PO CAPS
100.0000 mg | ORAL_CAPSULE | Freq: Two times a day (BID) | ORAL | Status: DC
  Administered 2019-07-03 – 2019-07-05 (×4): 100 mg via ORAL
  Filled 2019-07-03 (×4): qty 1

## 2019-07-03 NOTE — Lactation Note (Signed)
This note was copied from a baby's chart. Lactation Consultation Note  Patient Name: Anna Jordan Date: 07/03/2019 Reason for consult: Initial assessment;NICU baby;Late-preterm 34-36.6wks;Maternal endocrine disorder Type of Endocrine Disorder?: Diabetes  57 - I visited [name] to conduct initial lactation consultation. She had her DEBP set up at the bed side. She states that initially she was able to pump 10 mls and then 5 mls. Her last several pumping sessions, she only saw droplets. She has been pumping every 3 hours.  She was still on magnesium during this visit, but she was near the end of the cycle.  I reviewed the NICU and breast feeding booklet and educated on normal pumping output in the first 3 days. I reviewed how to dissassemble, clean and reassemble her pump parts. She states that she soaked her pump parts earlier. I discouraged her from doing this and discussed the risks.  [name] had difficulty latching her first two children, and she attributes this to flat nipples. She primarily pumped. She states that she had ample milk supply until around 5 months post partum.  I observed [name]'s breasts. Her nipples are short, but everted. I encouraged her to pump 8 times a day, and I changed her flange size to 27. I also provided her with breast shells to help draw her nipples out a bit.  [name] asked if we could return when she is ready to attempt latching her daughter, "[name]." I encouraged her to call us via her RN or have her RN set up an appointment.  I also encouraged [name] to contact [facility] tomorrow about eligibility for a DEBP. She verbalized understanding.   I shared our community breast feeding resources.  Maternal Data Formula Feeding for Exclusion: No Has patient been taught Hand Expression?: Yes Does the patient have breastfeeding experience prior to this delivery?: Yes  Feeding Feeding Type:  Formula  Interventions Interventions: Breast feeding basics reviewed;Shells(NICU booklet)  Lactation Tools Discussed/Used Tools: Shells Shell Type: Inverted WIC Program: Yes Pump Review: Setup, frequency, and cleaning   Consult Status Consult Status: Follow-up Date: 07/04/19 Follow-up type: In-patient    [name] 07/03/2019, 8:46 PM

## 2019-07-03 NOTE — Addendum Note (Signed)
Addendum  created 07/03/19 0752 by Asher Muir, CRNA   Intraprocedure Staff edited

## 2019-07-03 NOTE — Progress Notes (Signed)
Subjective: Postpartum Day 1: Cesarean Delivery Patient reports incisional pain, tolerating PO and no problems voiding.  Foley catheter out. Has been ambulating some.  Objective: Vital signs in last 24 hours: Temp:  [98.1 F (36.7 C)-99.1 F (37.3 C)] 98.2 F (36.8 C) (08/19 9702) Pulse Rate:  [85-121] 85 (08/19 0613) Resp:  [9-28] 18 (08/19 0613) BP: (116-153)/(52-97) 143/73 (08/19 0613) SpO2:  [96 %-100 %] 98 % (08/19 6378) Weight:  [118.8 kg] 118.8 kg (08/18 1349)  Physical Exam:  General: alert, cooperative and no distress Lochia: appropriate Uterine Fundus: firm Incision: wound vac in place DVT Evaluation: No evidence of DVT seen on physical exam. Negative Homan's sign. No cords or calf tenderness. No significant calf/ankle edema.  Recent Labs    07/02/19 1150 07/03/19 0502  HGB 11.6* 10.1*  HCT 37.0 31.6*    Assessment/Plan: Status post Cesarean section. Doing well postoperatively.  Continue current care. Continue magnesium.  BP controlled. On metformin 500mg  BID. CBG this AM 134. May need to increase metformin.  Truett Mainland 07/03/2019, 8:00 AM

## 2019-07-03 NOTE — Lactation Note (Signed)
This note was copied from a baby's chart. Lactation Consultation Note Attempted to see mom. Mom and support person sleeping soundly.  Patient Name: Anna Jordan [id number] Date: 07/03/2019     Maternal Data    Feeding    LATCH Score                   Interventions    Lactation Tools Discussed/Used     Consult Status      Theodoro Kalata 07/03/2019, 5:57 AM

## 2019-07-04 ENCOUNTER — Other Ambulatory Visit: Payer: Medicaid Other

## 2019-07-04 MED ORDER — PANTOPRAZOLE SODIUM 40 MG PO TBEC
40.0000 mg | DELAYED_RELEASE_TABLET | Freq: Every day | ORAL | Status: DC
  Administered 2019-07-05: 40 mg via ORAL
  Filled 2019-07-04: qty 1

## 2019-07-04 NOTE — Progress Notes (Signed)
Subjective: Postpartum Day 2: Cesarean Delivery d/t [facility] Patient reports feeling sore. Pain controlled with po meds. Tolerating diet. Ambulating and voiding without problems.  Tolerating diet. + flatus  Objective: Vital signs in last 24 hours: Temp:  [97.6 F (36.4 C)-98.4 F (36.9 C)] 98.4 F (36.9 C) (08/20 1005) Pulse Rate:  [79-98] 97 (08/20 1005) Resp:  [17-20] 19 (08/20 1005) BP: (109-144)/(45-68) 126/57 (08/20 1005) SpO2:  [95 %-100 %] 100 % (08/20 1005)  Physical Exam:  General: alert Lochia: appropriate Uterine Fundus: firm Incision: healing well DVT Evaluation: No evidence of DVT seen on physical exam.  Recent Labs    07/02/19 1150 07/03/19 0502  HGB 11.6* 10.1*  HCT 37.0 31.6*    Assessment/Plan: Status post Cesarean section. Doing well postoperatively.  Continue current care.  Chancy Milroy 07/04/2019, 10:46 AM

## 2019-07-04 NOTE — Lactation Note (Signed)
This note was copied from a baby's chart. Lactation Consultation Note  Patient Name: [name] ACZYS'A Date: 07/04/2019 Reason for consult: Follow-up assessment;NICU baby  1617 - I followed up with [name]. She states that her last pump, she obtained 5 mls combined. She expressed frustration about her milk volume. I took out the NICU booklet and reviewed pump volume expectations for days 1-3 (currently 46 hours post partem). I praised her for keeping her pumping going every three hours and tried to encourage her that her current pumping volume was normal.   I recommended that she continue to pump q3 hours and to give her body a few more days to allow her mature milk to transition.   [name] has not contacted [facility]. She has a Medela personal pump at home, and she has asked her spouse to pick it up from [place]. I offered to write a referral to [facility], and she declined, stating she preferred to use her own.  I listened to [name] discuss her first visit to see [name] and heard her concerns about lack of mobility and pain. I tried to empathize and reiterate how great she is doing and encourage her to give her body a few more days to see an increase in production. She verbalized understanding and had no further questions at this time.   Feeding Feeding Type: Formula   Interventions Interventions: Breast feeding basics reviewed  Lactation Tools Discussed/Used WIC Program: Yes Pump Review: Setup, frequency, and cleaning   Consult Status Consult Status: Follow-up Date: 07/05/19 Follow-up type: In-patient    [name] 07/04/2019, 4:43 PM

## 2019-07-05 ENCOUNTER — Encounter (HOSPITAL_COMMUNITY): Payer: Self-pay

## 2019-07-05 MED ORDER — MEASLES, MUMPS & RUBELLA VAC IJ SOLR
0.5000 mL | Freq: Once | INTRAMUSCULAR | Status: AC
  Administered 2019-07-05: 0.5 mL via SUBCUTANEOUS
  Filled 2019-07-05: qty 0.5

## 2019-07-05 MED ORDER — IBUPROFEN 800 MG PO TABS: 800.0000 mg | ORAL_TABLET | Freq: Three times a day (TID) | ORAL | 1 refills | Status: AC

## 2019-07-05 MED ORDER — OXYCODONE-ACETAMINOPHEN 5-325 MG PO TABS: 1.0000 | ORAL_TABLET | ORAL | 0 refills | Status: DC | PRN

## 2019-07-05 MED ORDER — METFORMIN HCL 500 MG PO TABS: 500.0000 mg | ORAL_TABLET | Freq: Two times a day (BID) | ORAL | 1 refills | Status: AC

## 2019-07-05 NOTE — Discharge Instructions (Signed)

## 2019-07-05 NOTE — Progress Notes (Signed)
Pt discharged to home. Condition stable. Pt ambulated to NICU with RN with plans to leave hospital from NICU. No equipment ordered for home at discharge.

## 2019-07-05 NOTE — Lactation Note (Signed)
This note was copied from a baby's chart. Lactation Consultation Note  Patient Name: Anna Jordan [id number]'A Date: 07/05/2019 Reason for consult: Follow-up assessment  Mom had no questions about pumping, but did have questions about how to use the breast shells that were left in the room. I showed her how to wear them if she were wearing a bra or tank top.  Toothbrush also provided to assist Mom in washing her breast pump parts.   Mom did not pump overnight & appears to have recently woken up. In addition to having been treated for pre-eclampsia & GDM, she also has a hx of schizophrenia. Mom's EBL with delivery was 1300 mL.   Anna Jordan [facility] 07/05/2019, 8:11 AM

## 2019-07-05 NOTE — Clinical Social Work Maternal (Signed)
CLINICAL SOCIAL WORK MATERNAL/CHILD NOTE  Patient Details  Name: Anna Jordan MRN: [id number] Date of Birth: 09/13/1991  Date:  07/04/2019 Clinical Social Worker Initiating Note:  Anna Scrima, LCSW Date/Time: Initiated:  07/04/19/1500     Child's Name:  Anna Jordan   Biological Parents:  Mother, Father(Father: Anna Jordan)   Need for Interpreter:  None   Reason for Referral:  Other (Comment)(NICU Admission)   Address:  [address] [place] [address]    Phone number:  [phone] (home)     Additional phone number:   Household Members/Support Persons (HM/SP):   Household Member/Support Person 1, Household Member/Support Person 2, Household Member/Support Person 3   HM/SP Name Relationship DOB or Age  HM/SP -1   FOB's grandmother    HM/SP -2 Anna Jordan son 7 years old  HM/SP -3 Anna Jordan son 20 months old  HM/SP -4        HM/SP -5        HM/SP -6        HM/SP -7        HM/SP -8          Natural Supports (not living in the home):  Spouse/significant other   Professional Supports:     Employment: Homemaker   Type of Work:     Education:  Some College   Homebound arranged:    Financial Resources:  Medicaid   Other Resources:  Food Stamps , WIC   Cultural/Religious Considerations Which May Impact Care:    Strengths:  Understanding of illness, Home prepared for child , Ability to meet basic needs , Pediatrician chosen   Psychotropic Medications:         Pediatrician:    [place] (including [place])  Pediatrician List:   [place]    [place]    [place]    [place]    [place]    [place] [facility]    Pediatrician Fax Number:    Risk Factors/Current Problems:  None   Cognitive State:  Alert , Able to Concentrate , Insightful , Goal Oriented , Linear Thinking    Mood/Affect:  Anxious , Calm , Interested    CSW Assessment: CSW met with  Anna Jordan at infant's bedside to discuss infant's NICU admission. Anna Jordan was sitting down and holding infant. CSW introduced self and explained reason for visit. Anna Jordan was welcoming and engaged during assessment. Anna Jordan reported that she resides with FOB's grandmother and her 2 sons. 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 Anna Jordan reported that she has all items needed to care for infant including a car seat, basinet and cradle. CSW inquired about Anna Jordan's support system, Anna Jordan reported that FOB is her support and her mom if needed. Anna Jordan reported that her sons are currently being cared for by FOB and FOB's grandmother.   CSW inquired about Anna Jordan's mental health history. Anna Jordan reported that she was diagnosed with Generalized Anxiety Disorder in 2017. Anna Jordan reported that she is not currently experiencing any symptoms, nor taking medication or in therapy. Anna Jordan reported that she has a history of depression and was diagnosed at age 13. Anna Jordan emphasized that she is not depressed and reported that she hasn't experienced depressive symptoms in 10 years. Anna Jordan endorsed a history of postpartum depression after her last pregnancy. Anna Jordan reported that she had crying spells until her son was 5 weeks old and didn't want anyone else to   care for him. Anna Jordan shared that after she gave birth she was on magnesium and couldn't be left alone with infant which resulted in infant going to the nursery several times. Anna Jordan reported that she continued to feel like someone was going to take her baby after she was discharged home. Anna Jordan reported that she started taking buspirone and that it made her symptoms worse. Anna Jordan reported that her symptoms subsided when infant was 21 weeks old and she stopped having thoughts of someone taking him from her. CSW inquired about how Anna Jordan is currently feeling, Anna Jordan reported that she feels much better after giving birth. Anna Jordan reported that she was in pain prior to giving birth. Anna Jordan presented calm and had  insight on her mental health history. Anna Jordan presented slightly anxious when speaking about infant being connected to cords and her fears surrounding that. CSW acknowledged and normalized Anna Jordan's feelings. Anna Jordan did not demonstrate any acute mental health signs/symptoms. CSW assessed for safety, Anna Jordan denied SI, HI and domestic violence. CSW informed Anna Jordan that due to her mental health history she may be more susceptible to PPD.  CSW provided education regarding the baby blues period vs. perinatal mood disorders, discussed treatment and gave resources for mental health follow up if concerns arise.  CSW recommends self-evaluation during the postpartum time period using the New Mom Checklist from Postpartum Progress and encouraged Anna Jordan to contact a medical professional if symptoms are noted at any time.    CSW provided review of Sudden Infant Death Syndrome (SIDS) precautions. Anna Jordan verbalized understanding and reported that infant will either sleep in basinet or cradle located in Anna Jordan's room.  CSW and Anna Jordan discussed infant's NICU admission. Anna Jordan reported that it has been great, the staff is wonderful and she feels well informed about infant's care. CSW informed Anna Jordan about the NICU, what to expect and resources/supports available while infant is admitted to the NICU. Anna Jordan denied any questions or concerns. Anna Jordan denied any transportation barriers and reported that she plans to stay with infant until infant is ready for discharge. CSW encouraged Anna Jordan to contact CSW if any needs/concerns arise.   CSW will continue to offer resources/supports while infant is admitted to the NICU.     CSW Plan/Description:  Perinatal Mood and Anxiety Disorder (PMADs) Education, Sudden Infant Death Syndrome (SIDS) Education, Other Patient/Family Education    Anna Medin, LCSW 09-23-19, 9:31 AM

## 2019-07-06 ENCOUNTER — Inpatient Hospital Stay (HOSPITAL_COMMUNITY)
Admission: RE | Admit: 2019-07-06 | Discharge: 2019-07-06 | Disposition: A | Discharge status: Home / Self Care | Payer: Medicaid Other | Source: Ambulatory Visit

## 2019-07-08 ENCOUNTER — Ambulatory Visit (HOSPITAL_COMMUNITY): Admission: RE | Admit: 2019-07-08 | Payer: Medicaid Other | Source: Ambulatory Visit

## 2019-07-08 ENCOUNTER — Encounter: Payer: Medicaid Other | Admitting: Obstetrics & Gynecology

## 2019-07-08 ENCOUNTER — Ambulatory Visit: Payer: Self-pay

## 2019-07-08 NOTE — Lactation Note (Signed)
This note was copied from a baby's chart. Lactation Consultation Note  Patient Name: Anna Jordan [id number] Date: 07/08/2019    [facility] Follow Up Visit:  Attempted to visit with mother who requested [place] visit.  She was not available.  Spoke with RN and she can call me back if needed.   Maternal Data    Feeding Feeding Type: Breast Milk Nipple Type: Nfant Extra Slow Flow (gold)  LATCH Score                   Interventions    Lactation Tools Discussed/Used     Consult Status Consult Status: PRN Date: 07/08/19 Follow-up type: Call as needed    Kimberle Stanfill R Keiondre Colee 07/08/2019, 2:19 PM

## 2019-07-11 ENCOUNTER — Ambulatory Visit (HOSPITAL_COMMUNITY): Payer: Medicaid Other

## 2019-07-11 ENCOUNTER — Encounter (HOSPITAL_COMMUNITY): Payer: Self-pay

## 2019-07-11 ENCOUNTER — Other Ambulatory Visit: Payer: Self-pay

## 2019-07-11 ENCOUNTER — Ambulatory Visit (INDEPENDENT_AMBULATORY_CARE_PROVIDER_SITE_OTHER): Payer: Medicaid Other | Admitting: Obstetrics & Gynecology

## 2019-07-11 ENCOUNTER — Encounter: Payer: Self-pay | Admitting: Obstetrics & Gynecology

## 2019-07-11 VITALS — BP 130/78 | Wt 255.0 lb

## 2019-07-11 DIAGNOSIS — F53 Postpartum depression: Secondary | ICD-10-CM

## 2019-07-11 DIAGNOSIS — Z4889 Encounter for other specified surgical aftercare: Secondary | ICD-10-CM

## 2019-07-11 MED ORDER — SERTRALINE HCL 50 MG PO TABS: 50.0000 mg | ORAL_TABLET | Freq: Every day | ORAL | 3 refills | Status: AC

## 2019-07-11 MED ORDER — SERTRALINE HCL 25 MG PO TABS: 25.0000 mg | ORAL_TABLET | Freq: Every day | ORAL | 0 refills | Status: AC

## 2019-07-11 NOTE — Progress Notes (Signed)
History:  28 y.o. [id number] here today for 9 day wound check. Pt is s/p c-section with BTL. Her infant is still in the NCU. She reportsd that she had a wound vac that she removed 2 days prev.   Pt reports that she cannot stop crying. She has a h/o depression but, not post partum depression. She denies suicidal or homicidal ideation. Her Flavia Shipper test is 7.     She reports that she is voiding and passing stools without difficulty. Pts hosp course was complicated with Preeclampsia with severe features. Pt without HA or other sx. She had significant swelling which has improved but, is still bothersome.  She can get her feet in to shoes now.   The following portions of the patient's history were reviewed and updated as appropriate: allergies, current medications, past family history, past medical history, past social history, past surgical history and problem list.  Review of Systems:  Pertinent items are noted in HPI.    Objective:  Physical Exam Blood pressure 130/78, weight 255 lb (115.7 kg), currently breastfeeding.  CONSTITUTIONAL: Well-developed, well-nourished female in no acute distress. Pt teary on questioning. Was imporved by end of conversation HENT:  Normocephalic, atraumatic EYES: Conjunctivae and EOM are normal. No scleral icterus.  NECK: Normal range of motion SKIN: Skin is warm and dry. No rash noted. Not diaphoretic.No pallor. [place]: Alert and oriented to person, place, and time. Normal coordination.  Abd: Soft, nontender and nondistended; large pannus. Incision is low and is healing well. NO erythema a or drainage.    Assessment & Plan:  9 day post op visit.   H/o preeclmapsia-   BP WNL.   LE edema improving. 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 Pt would like to begin meds as  well.    Zoloft 25 mg q day x 7 days followed by Zoloft 50mg  daily  F/u in 2 weeks virtually for recheck of PP  depression  F/u sooner prn  Khian Remo L. Harraway-Smith, M.D., Cherlynn June

## 2019-07-14 ENCOUNTER — Emergency Department (HOSPITAL_COMMUNITY): Payer: Medicaid Other

## 2019-07-14 ENCOUNTER — Inpatient Hospital Stay (HOSPITAL_COMMUNITY)
Admission: EM | Admit: 2019-07-14 | Discharge: 2019-07-16 | DRG: 776 | Disposition: A | Discharge status: Home / Self Care | Payer: Medicaid Other | Attending: Obstetrics & Gynecology | Admitting: Obstetrics & Gynecology

## 2019-07-14 ENCOUNTER — Encounter (HOSPITAL_COMMUNITY): Payer: Self-pay | Admitting: *Deleted

## 2019-07-14 ENCOUNTER — Other Ambulatory Visit: Payer: Self-pay

## 2019-07-14 DIAGNOSIS — R030 Elevated blood-pressure reading, without diagnosis of hypertension: Secondary | ICD-10-CM | POA: Diagnosis not present

## 2019-07-14 DIAGNOSIS — O99345 Other mental disorders complicating the puerperium: Secondary | ICD-10-CM | POA: Diagnosis present

## 2019-07-14 DIAGNOSIS — O152 Eclampsia in the puerperium: Secondary | ICD-10-CM | POA: Diagnosis present

## 2019-07-14 DIAGNOSIS — R404 Transient alteration of awareness: Secondary | ICD-10-CM

## 2019-07-14 DIAGNOSIS — R4182 Altered mental status, unspecified: Secondary | ICD-10-CM | POA: Diagnosis present

## 2019-07-14 DIAGNOSIS — O9089 Other complications of the puerperium, not elsewhere classified: Secondary | ICD-10-CM | POA: Diagnosis not present

## 2019-07-14 DIAGNOSIS — Z20828 Contact with and (suspected) exposure to other viral communicable diseases: Secondary | ICD-10-CM | POA: Diagnosis present

## 2019-07-14 LAB — URINALYSIS, ROUTINE W REFLEX MICROSCOPIC
Bilirubin Urine: NEGATIVE
Glucose, UA: 50 mg/dL — AB
Ketones, ur: NEGATIVE mg/dL
Leukocytes,Ua: NEGATIVE
Nitrite: NEGATIVE
Protein, ur: 100 mg/dL — AB
Specific Gravity, Urine: 1.013 (ref 1.005–1.030)
pH: 6 (ref 5.0–8.0)

## 2019-07-14 LAB — RAPID URINE DRUG SCREEN, HOSP PERFORMED
Amphetamines: POSITIVE — AB
Barbiturates: NOT DETECTED
Benzodiazepines: NOT DETECTED
Cocaine: NOT DETECTED
Opiates: POSITIVE — AB
Tetrahydrocannabinol: NOT DETECTED

## 2019-07-14 LAB — COMPREHENSIVE METABOLIC PANEL
ALT: 35 U/L (ref 0–44)
AST: 34 U/L (ref 15–41)
Albumin: 3.4 g/dL — ABNORMAL LOW (ref 3.5–5.0)
Alkaline Phosphatase: 95 U/L (ref 38–126)
Anion gap: 16 — ABNORMAL HIGH (ref 5–15)
BUN: 10 mg/dL (ref 6–20)
CO2: 21 mmol/L — ABNORMAL LOW (ref 22–32)
Calcium: 9.2 mg/dL (ref 8.9–10.3)
Chloride: 103 mmol/L (ref 98–111)
Creatinine, Ser: 0.94 mg/dL (ref 0.44–1.00)
GFR calc Af Amer: >60 mL/min (ref >60)
GFR calc non Af Amer: >60 mL/min (ref >60)
Glucose, Bld: 256 mg/dL — ABNORMAL HIGH (ref 70–99)
Potassium: 3.7 mmol/L (ref 3.5–5.1)
Sodium: 140 mmol/L (ref 135–145)
Total Bilirubin: 0.3 mg/dL (ref 0.3–1.2)
Total Protein: 6.7 g/dL (ref 6.5–8.1)

## 2019-07-14 LAB — GLUCOSE, CAPILLARY
Glucose-Capillary: 102 mg/dL — ABNORMAL HIGH (ref 70–99)
Glucose-Capillary: 111 mg/dL — ABNORMAL HIGH (ref 70–99)

## 2019-07-14 LAB — CBC WITH DIFFERENTIAL/PLATELET
Abs Immature Granulocytes: 0.06 10*3/uL (ref 0.00–0.07)
Basophils Absolute: 0.1 10*3/uL (ref 0.0–0.1)
Basophils Relative: 1 %
Eosinophils Absolute: 0.2 10*3/uL (ref 0.0–0.5)
Eosinophils Relative: 1 %
HCT: 42.9 % (ref 36.0–46.0)
Hemoglobin: 12.8 g/dL (ref 12.0–15.0)
Immature Granulocytes: 0 %
Lymphocytes Relative: 55 %
Lymphs Abs: 9.7 10*3/uL — ABNORMAL HIGH (ref 0.7–4.0)
MCH: 27.9 pg (ref 26.0–34.0)
MCHC: 29.8 g/dL — ABNORMAL LOW (ref 30.0–36.0)
MCV: 93.5 fL (ref 80.0–100.0)
Monocytes Absolute: 0.8 10*3/uL (ref 0.1–1.0)
Monocytes Relative: 5 %
Neutro Abs: 6.6 10*3/uL (ref 1.7–7.7)
Neutrophils Relative %: 38 %
Platelets: 583 10*3/uL — ABNORMAL HIGH (ref 150–400)
RBC: 4.59 MIL/uL (ref 3.87–5.11)
RDW: 15.4 % (ref 11.5–15.5)
WBC: 17.3 10*3/uL — ABNORMAL HIGH (ref 4.0–10.5)
nRBC: 0 % (ref 0.0–0.2)

## 2019-07-14 LAB — I-STAT BETA HCG BLOOD, ED (MC, WL, AP ONLY): I-stat hCG, quantitative: 18.5 m[IU]/mL — ABNORMAL HIGH (ref <5)

## 2019-07-14 LAB — SALICYLATE LEVEL: Salicylate Lvl: <7 mg/dL (ref 2.8–30.0)

## 2019-07-14 LAB — ACETAMINOPHEN LEVEL: Acetaminophen (Tylenol), Serum: <10 ug/mL — ABNORMAL LOW (ref 10–30)

## 2019-07-14 LAB — SARS CORONAVIRUS 2 BY RT PCR (HOSPITAL ORDER, PERFORMED IN ~~LOC~~ HOSPITAL LAB): SARS Coronavirus 2: NEGATIVE

## 2019-07-14 LAB — ETHANOL: Alcohol, Ethyl (B): <10 mg/dL (ref <10)

## 2019-07-14 MED ORDER — NALOXONE HCL 2 MG/2ML IJ SOSY
PREFILLED_SYRINGE | INTRAMUSCULAR | Status: AC
  Filled 2019-07-14: qty 2

## 2019-07-14 MED ORDER — OXYCODONE-ACETAMINOPHEN 5-325 MG PO TABS: 1.0000 | ORAL_TABLET | ORAL | Status: DC | PRN

## 2019-07-14 MED ORDER — IBUPROFEN 800 MG PO TABS
800.0000 mg | ORAL_TABLET | Freq: Three times a day (TID) | ORAL | Status: DC
  Administered 2019-07-14 – 2019-07-16 (×5): 800 mg via ORAL
  Filled 2019-07-14 (×6): qty 1

## 2019-07-14 MED ORDER — ONDANSETRON HCL 4 MG/2ML IJ SOLN
4.0000 mg | Freq: Once | INTRAMUSCULAR | Status: AC
  Administered 2019-07-14: 4 mg via INTRAVENOUS

## 2019-07-14 MED ORDER — SODIUM CHLORIDE 0.9 % IV BOLUS
1000.0000 mL | Freq: Once | INTRAVENOUS | Status: AC
  Administered 2019-07-14: 09:00:00 1000 mL via INTRAVENOUS

## 2019-07-14 MED ORDER — PRENATAL 27-0.8 MG PO TABS: 1.0000 | ORAL_TABLET | Freq: Every day | ORAL | Status: DC

## 2019-07-14 MED ORDER — SERTRALINE HCL 50 MG PO TABS
50.0000 mg | ORAL_TABLET | Freq: Every day | ORAL | Status: DC
  Administered 2019-07-14 – 2019-07-16 (×3): 50 mg via ORAL
  Filled 2019-07-14 (×3): qty 1

## 2019-07-14 MED ORDER — PRENATAL MULTIVITAMIN CH
1.0000 | ORAL_TABLET | Freq: Every day | ORAL | Status: DC
  Administered 2019-07-14 – 2019-07-16 (×2): 1 via ORAL
  Filled 2019-07-14 (×2): qty 1

## 2019-07-14 MED ORDER — MAGNESIUM SULFATE 2 GM/50ML IV SOLN
2.0000 g | INTRAVENOUS | Status: DC
  Administered 2019-07-14 (×3): 2 g via INTRAVENOUS
  Filled 2019-07-14 (×13): qty 50

## 2019-07-14 MED ORDER — NALOXONE HCL 2 MG/2ML IJ SOSY
2.0000 mg | PREFILLED_SYRINGE | Freq: Once | INTRAMUSCULAR | Status: AC
  Administered 2019-07-14: 08:00:00 2 mg via INTRAVENOUS

## 2019-07-14 MED ORDER — LACTATED RINGERS IV SOLN
INTRAVENOUS | Status: DC
  Administered 2019-07-14: 16:00:00 via INTRAVENOUS

## 2019-07-14 MED ORDER — ONDANSETRON HCL 4 MG/2ML IJ SOLN
INTRAMUSCULAR | Status: AC
  Filled 2019-07-14: qty 2

## 2019-07-14 MED ORDER — MAGNESIUM SULFATE 40 G IN LACTATED RINGERS - SIMPLE
2.0000 g/h | INTRAVENOUS | Status: DC
  Administered 2019-07-14: 16:00:00 2 g/h via INTRAVENOUS
  Filled 2019-07-14: qty 500

## 2019-07-14 MED ORDER — MAGNESIUM SULFATE 2 GM/50ML IV SOLN: 2.0000 g | Freq: Once | INTRAVENOUS | Status: DC

## 2019-07-14 MED ORDER — MAGNESIUM SULFATE 4 GM/100ML IV SOLN
4.0000 g | Freq: Once | INTRAVENOUS | Status: AC
  Administered 2019-07-14: 10:00:00 4 g via INTRAVENOUS
  Filled 2019-07-14: qty 100

## 2019-07-14 NOTE — ED Notes (Signed)
Pt tearful, crying, stating that she's tired of being sad.  Does not remember going to [place] to pick up kids, but does know that today is Sunday.  Keeps asking what is happening.

## 2019-07-14 NOTE — ED Provider Notes (Signed)
Care assumed from [place], [place] after Narcan was given and patient began to improve in mental status and respiratory status.  Patient's mental status continued to improve.  On my exam, lungs are clear and chest is nontender.  Patient does have some lower extremity edema with out leg tenderness.  Good pulses in extremities.  Abdomen has surgical scar in place with no significant tenderness on my exam.  Patient is disoriented initially but is oriented to her name.  I spoke with the patient's husband who reports that they recently got back from [place] overnight to go pick up their older children as they just had a baby 10 days ago from C-section.  According to husband conversation and chart review, patient had severe preeclampsia during this pregnancy.  There is also some concern about depression and her being started on some new depression medication.  Husband reports that they were doing well in the car and the patient had taken some pain medication to help with her discomfort in her abdomen.  He thinks she may have taken 1 or 2 Percocet.  He reports looking over and she was unresponsive and not breathing.  He reports she did bite her tongue but she was not having shaking like she has had in the past with seizures.  He started giving her rescue breathing but then when she did not start breathing and started looking cyanotic he drove her to the emergency department immediately.  Further evaluation of the patient reveals that her blood pressure is elevated at 245 systolic.  Given history of preeclampsia, elevated blood pressure, and possible seizure-like activity, I am concerned she may have had an eclamptic seizure in the postpartum.  Patient was given magnesium and OBG will be called.  I do suspect that since the Narcan woke the patient up, patient may have had a seizure and had a postictal period that was severely worsened by the pain medication and partial accidental overdose.  The Narcan took away  the Percocet effect however patient was still confused when she woke up and I suspect that may have been postictal.  OB/GYN reports they will admit the patient for further monitoring of possible eclamptic seizure.  They did recommend 4 g of magnesium initially with 2 g of magnesium following and subsequent hours.  They did not feel she needed any imaging at this time and husband reports she did not fall or hit her head with no trauma.  Patient is feeling better on my last assessment.  OB/GYN did not feel she needed neurologic consultation at this time.  Patient denied suicidal ideation or attempt to hurt her self to me however after she wakes up given history of depression, this will likely need to be reassessed further.  Patient given fluids and have screening labs.  Patient will be admitted.    CRITICAL CARE Performed by: Gwenyth Allegra Neville [place]ston Total critical care time: 35 minutes Critical care time was exclusive of separately billable procedures and treating other patients. Critical care was necessary to treat or prevent imminent or life-threatening deterioration. Critical care was time spent personally by me on the following activities: development of treatment plan with patient and/or surrogate as well as nursing, discussions with consultants, evaluation of patient's response to treatment, examination of patient, obtaining history from patient or surrogate, ordering and performing treatments and interventions, ordering and review of laboratory studies, ordering and review of radiographic studies, pulse oximetry and re-evaluation of patient's condition.     Clinical Impression: 1. Altered mental  status, unspecified altered mental status type   2. Elevated blood pressure reading     Disposition: Admit  This note was prepared with assistance of Dragon voice recognition software. Occasional wrong-word or sound-a-like substitutions may have occurred due to the inherent limitations of voice  recognition software.     Kennisha Qin, Canary Brimhristopher J, MD 07/14/19 [phone]

## 2019-07-14 NOTE — Progress Notes (Signed)
Patient arrived to 6N18, tearful and upset.  A&Ox4, VSS, IV intact.  Spouse/boyfriend at bedside.  Noted to have recent cesarean section with attached edges, no redness, closures have been removed.  Patient and spouse/boyfriend oriented to room and equipment.  Will continue to monitor.

## 2019-07-14 NOTE — ED Notes (Signed)
Immediate response to narcan.  Pt sitting up, vomiting, yelling.

## 2019-07-14 NOTE — H&P (Signed)
Anna Jordan is an 28 y.o. female. [id number] 10 days postop from a cesarean section for breech 8/21 at 35 weeks with poorly controlled diabetes. Previous notes in the ED were reviewed. She became apneic and cyanotic in her car and her husband gave rescue breaths and drove her to the ED. She was ventilated and was given Narcan and she responded. Further evaluation of the patient reveals that her blood pressure is elevated at 146 systolic.  Given history of preeclampsia, elevated blood pressure, and possible seizure-like activity, there was concern she may have had an eclamptic seizure in the postpartum. She has had eclampsia and was treated for preeclampsia after this delivery. Pertinent Gynecological History:  Menstrual History:  No LMP recorded.    Past Medical History:  Diagnosis Date  . Anxiety   . Class 3 severe obesity due to excess calories with body mass index (BMI) of 50.0 to 59.9 in adult (HCC) 07/18/2018  . Depression   . Gestational diabetes   . Hypertension   . Obesity   . Schizophrenia [facility])     Past Surgical History:  Procedure Laterality Date  . CESAREAN SECTION N/A 07/02/2019   Procedure: CESAREAN SECTION;  Surgeon: Levie Heritage, DO;  Location: [facility];  Service: Obstetrics;  Laterality: N/A;  . GALLBLADDER SURGERY  08/2018  . TUBAL LIGATION    . WISDOM TOOTH EXTRACTION      Family History  Problem Relation Age of Onset  . Depression Mother   . Diabetes Mother   . Heart disease Mother   . Stroke Father   . Hypertension Father   . Breast cancer Maternal Aunt        4 mat aunts with breast cancer  . Diabetes Brother   . Breast cancer Maternal Grandmother   . Breast cancer Paternal Grandmother   . Breast cancer Paternal Grandfather     Social History:  reports that she quit smoking about 2 years ago. Her smoking use included cigarettes. She has a 3.75 pack-year smoking history. She has never used smokeless tobacco. She reports that she does not drink  alcohol or use drugs.  Allergies:  Allergies  Allergen Reactions  . Penicillins Swelling    Did it involve swelling of the face/tongue/throat, SOB, or low BP? Yes Did it involve sudden or severe rash/hives, skin peeling, or any reaction on the inside of your mouth or nose? No Did you need to seek medical attention at a hospital or doctor's office? Yes When did it last happen?Childhood If all above answers are "NO", may proceed with cephalosporin use.   Marland Kitchen Hydrocodone Hives  . Morphine And Related Hives  . Tramadol Hives    Medications Prior to Admission  Medication Sig Dispense Refill Last Dose  . albuterol (PROVENTIL HFA;VENTOLIN HFA) 108 (90 Base) MCG/ACT inhaler Inhale 2 puffs into the lungs every 6 (six) hours as needed for wheezing or shortness of breath.   unknown at prn  . ibuprofen (ADVIL) 800 MG tablet Take 1 tablet (800 mg total) by mouth 3 (three) times daily. 30 tablet 1 07/13/2019 at Unknown time  . oxyCODONE-acetaminophen (PERCOCET/ROXICET) 5-325 MG tablet Take 1 tablet by mouth every 4 (four) hours as needed for moderate pain or severe pain. 24 tablet 0 07/14/2019 at prn  . Accu-Chek FastClix Lancets MISC 1 Device by Percutaneous route 4 (four) times daily. (Patient not taking: Reported on 07/11/2019) 100 each 12   . Blood Glucose Monitoring Suppl (ACCU-CHEK AVIVA) device Use as instructed (Patient  not taking: Reported on 07/11/2019) 1 each 0   . metFORMIN (GLUCOPHAGE) 500 MG tablet Take 1 tablet (500 mg total) by mouth 2 (two) times daily with a meal. (Patient not taking: Reported on 07/14/2019) 60 tablet 1 Not Taking at Unknown time  . omeprazole (PRILOSEC) 20 MG capsule Take 1 capsule (20 mg total) by mouth daily. (Patient not taking: Reported on 07/11/2019) 30 capsule 4   . Prenatal Vit-Fe Fumarate-FA (MULTIVITAMIN-PRENATAL) 27-0.8 MG TABS tablet Take 1 tablet by mouth daily at 12 noon. (Patient not taking: Reported on 07/11/2019) 30 tablet 10   . sertraline (ZOLOFT) 25 MG  tablet Take 1 tablet (25 mg total) by mouth daily. For 7 days followed by 50 mg daily. 7 tablet 0   . sertraline (ZOLOFT) 50 MG tablet Take 1 tablet (50 mg total) by mouth daily. 30 tablet 3     Review of Systems  Constitutional: Positive for malaise/fatigue.  Respiratory: Negative.   Cardiovascular: Negative.   Gastrointestinal: Negative.   Neurological: Negative.     Blood pressure (!) 148/72, pulse 66, temperature 98.9 F (37.2 C), temperature source Oral, resp. rate 18, height 5\' 3"  (1.6 m), SpO2 98 %, currently breastfeeding. Physical Exam  Vitals reviewed. Constitutional: She is oriented to person, place, and time. She appears well-developed. No distress.  Respiratory: Effort normal.  GI: She exhibits no distension. There is no abdominal tenderness.  Incision is intact and dry, not tender  Musculoskeletal: Normal range of motion.  Neurological: She is alert and oriented to person, place, and time.  Skin: Skin is warm and dry.  Psychiatric: She has a normal mood and affect. Her behavior is normal.    Results for orders placed or performed during the hospital encounter of 07/14/19 (from the past 24 hour(s))  CBC with Differential     Status: Abnormal   Collection Time: 07/14/19  8:20 AM  Result Value Ref Range   WBC 17.3 (H) 4.0 - 10.5 K/uL   RBC 4.59 3.87 - 5.11 MIL/uL   Hemoglobin 12.8 12.0 - 15.0 g/dL   HCT 96.042.9 45.436.0 - 09.846.0 %   MCV 93.5 80.0 - 100.0 fL   MCH 27.9 26.0 - 34.0 pg   MCHC 29.8 (L) 30.0 - 36.0 g/dL   RDW 11.915.4 14.711.5 - 82.915.5 %   Platelets 583 (H) 150 - 400 K/uL   nRBC 0.0 0.0 - 0.2 %   Neutrophils Relative % 38 %   Neutro Abs 6.6 1.7 - 7.7 K/uL   Lymphocytes Relative 55 %   Lymphs Abs 9.7 (H) 0.7 - 4.0 K/uL   Monocytes Relative 5 %   Monocytes Absolute 0.8 0.1 - 1.0 K/uL   Eosinophils Relative 1 %   Eosinophils Absolute 0.2 0.0 - 0.5 K/uL   Basophils Relative 1 %   Basophils Absolute 0.1 0.0 - 0.1 K/uL   WBC Morphology ABSOLUTE LYMPHOCYTOSIS     Immature Granulocytes 0 %   Abs Immature Granulocytes 0.06 0.00 - 0.07 K/uL  Comprehensive metabolic panel     Status: Abnormal   Collection Time: 07/14/19  8:20 AM  Result Value Ref Range   Sodium 140 135 - 145 mmol/L   Potassium 3.7 3.5 - 5.1 mmol/L   Chloride 103 98 - 111 mmol/L   CO2 21 (L) 22 - 32 mmol/L   Glucose, Bld 256 (H) 70 - 99 mg/dL   BUN 10 6 - 20 mg/dL   Creatinine, Ser 5.620.94 0.44 - 1.00 mg/dL   Calcium 9.2  8.9 - 10.3 mg/dL   Total Protein 6.7 6.5 - 8.1 g/dL   Albumin 3.4 (L) 3.5 - 5.0 g/dL   AST 34 15 - 41 U/L   ALT 35 0 - 44 U/L   Alkaline Phosphatase 95 38 - 126 U/L   Total Bilirubin 0.3 0.3 - 1.2 mg/dL   GFR calc non Af Amer >60 >60 mL/min   GFR calc Af Amer >60 >60 mL/min   Anion gap 16 (H) 5 - 15  Ethanol     Status: None   Collection Time: 07/14/19  8:20 AM  Result Value Ref Range   Alcohol, Ethyl (B) <10 <10 mg/dL  Acetaminophen level     Status: Abnormal   Collection Time: 07/14/19  8:20 AM  Result Value Ref Range   Acetaminophen (Tylenol), Serum <10 (L) 10 - 30 ug/mL  Salicylate level     Status: None   Collection Time: 07/14/19  8:20 AM  Result Value Ref Range   Salicylate Lvl <6.6 2.8 - 30.0 mg/dL  I-Stat beta hCG blood, ED     Status: Abnormal   Collection Time: 07/14/19  8:41 AM  Result Value Ref Range   I-stat hCG, quantitative 18.5 (H) <5 mIU/mL   Comment 3          SARS Coronavirus 2 [facility] order, Performed in [place] hospital lab) Nasopharyngeal Nasopharyngeal Swab     Status: None   Collection Time: 07/14/19 10:03 AM   Specimen: Nasopharyngeal Swab  Result Value Ref Range   SARS Coronavirus 2 NEGATIVE NEGATIVE  Urine rapid drug screen (hosp performed)     Status: Abnormal   Collection Time: 07/14/19 11:47 AM  Result Value Ref Range   Opiates POSITIVE (A) NONE DETECTED   Cocaine NONE DETECTED NONE DETECTED   Benzodiazepines NONE DETECTED NONE DETECTED   Amphetamines POSITIVE (A) NONE DETECTED   Tetrahydrocannabinol NONE  DETECTED NONE DETECTED   Barbiturates NONE DETECTED NONE DETECTED  Urinalysis, Routine w reflex microscopic     Status: Abnormal   Collection Time: 07/14/19 11:47 AM  Result Value Ref Range   Color, Urine YELLOW YELLOW   APPearance HAZY (A) CLEAR   Specific Gravity, Urine 1.013 1.005 - 1.030   pH 6.0 5.0 - 8.0   Glucose, UA 50 (A) NEGATIVE mg/dL   Hgb urine dipstick LARGE (A) NEGATIVE   Bilirubin Urine NEGATIVE NEGATIVE   Ketones, ur NEGATIVE NEGATIVE mg/dL   Protein, ur 100 (A) NEGATIVE mg/dL   Nitrite NEGATIVE NEGATIVE   Leukocytes,Ua NEGATIVE NEGATIVE   RBC / HPF 6-10 0 - 5 RBC/hpf   WBC, UA 6-10 0 - 5 WBC/hpf   Bacteria, UA RARE (A) NONE SEEN   Squamous Epithelial / LPF 0-5 0 - 5   Mucus PRESENT     Dg Chest Portable 1 View  Result Date: 07/14/2019 CLINICAL DATA:  28 year old female found unresponsive in her car. History of seizure activity. EXAM: PORTABLE CHEST 1 VIEW COMPARISON:  None. FINDINGS: The lungs are clear and negative for focal airspace consolidation, pulmonary edema or suspicious pulmonary nodule. No pleural effusion or pneumothorax. Cardiac and mediastinal contours are within normal limits. No acute fracture or lytic or blastic osseous lesions. The visualized upper abdominal bowel gas pattern is unremarkable. IMPRESSION: Negative chest x-ray. Electronically Signed   By: Jacqulynn Cadet M.D.   On: 07/14/2019 09:37    Assessment/Plan:  Admit the patient for further monitoring of possible eclamptic seizure. Recommend 4 g of magnesium initially with 2  g of magnesium following and subsequent hours.  No imaging at this time and husband reports she did not fall or hit her head with no trauma.  Patient is feeling better on my last assessment. I will order neurologic consultation at this time.  Patient denied suicidal ideation or attempt to hurt her self to me however after she wakes up given history of depression, this will likely need to be reassessed further.   Scheryl DarterJames  Jivan Symanski 07/14/2019, 2:06 PM

## 2019-07-14 NOTE — ED Triage Notes (Signed)
Patient pulled from car for altered/ unresponsiveness. Spouse reports that she has hx of seizures but none reported this am. Patient arrived to the room with cyanosis noted to mouth and face, snoring respirations with minimal respiratory effort. Strong peripheral pulse. No trauma noted. Spouse informed staff that patient just had C-section on 8/18. Abdomen non-tender and soft. IV started, oral airway placed and patient bagged. RT at bedside. Narcan 2mg  administered and patient immediately woke up with small amount of emesis.

## 2019-07-14 NOTE — ED Provider Notes (Signed)
[facility] Provider Note   CSN: [id number] Arrival date & time: 07/14/19  0815     History   Chief Complaint Chief Complaint  Patient presents with  . unresponsive/ pulled from car    HPI Kathryne HitchBrandi Julia is a 28 y.o. female.     HPI   Level 5 caveat due to acuity of condition.  Kathryne HitchBrandi Deboer is a 28 y.o. female, with a history of HTN, obesity, schizophrenia, depression, anxiety, presenting to the ED unresponsive. Patient presented to the front door to the ED via POV.  Family member accompanying the patient states patient was smoking a cigarette and then passed out.      Past Medical History:  Diagnosis Date  . Anxiety   . Class 3 severe obesity due to excess calories with body mass index (BMI) of 50.0 to 59.9 in adult (HCC) 07/18/2018  . Depression   . Gestational diabetes   . Hypertension   . Obesity   . Schizophrenia [facility](HCC)     Patient Active Problem List   Diagnosis Date Noted  . Pre-eclampsia affecting pregnancy, antepartum 07/02/2019  . Preeclampsia, severe, third trimester 07/02/2019  . Gestational hypertension 07/01/2019  . Poorly controlled type 2 diabetes mellitus (HCC) 06/24/2019  . History of shoulder dystocia in prior pregnancy, currently pregnant 03/25/2019  . Late Entry to [employer]- March 2020- Social Distancing 02/12/2019  . Hx of pre-eclampsia in prior pregnancy, currently pregnant 01/16/2019  . Supervision of high-risk pregnancy 01/16/2019  . Type 2 diabetes mellitus affecting pregnancy, antepartum 01/16/2019  . Maternal morbid obesity, antepartum (HCC) 07/18/2018  . Lumbar facet joint pain 07/18/2018  . 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 Sacroiliitis (HCC) 07/18/2018  . Mild intermittent asthma with acute exacerbation 10/12/2017  . Generalized anxiety disorder 10/24/2016  . History of vertebral compression fracture 10/24/2016    Past Surgical History:  Procedure Laterality Date  .  CESAREAN SECTION N/A 07/02/2019   Procedure: CESAREAN SECTION;  Surgeon: Levie HeritageStinson, Jacob J, DO;  Location: [facility] LD ORS;  Service: Obstetrics;  Laterality: N/A;  . GALLBLADDER SURGERY  08/2018  . TUBAL LIGATION    . WISDOM TOOTH EXTRACTION       OB History    Gravida  5   Para  2   Term  1   Preterm  1   AB  2   Living  2     SAB  2   TAB      Ectopic      Multiple      Live Births  2            Home Medications    Prior to Admission medications   Medication Sig Start Date End Date Taking? Authorizing Provider  Accu-Chek FastClix Lancets MISC 1 Device by Percutaneous route 4 (four) times daily. Patient not taking: Reported on 07/11/2019 04/02/19   Allie Bossierove, Myra C, MD  albuterol (PROVENTIL HFA;VENTOLIN HFA) 108 (90 Base) MCG/ACT inhaler Inhale 2 puffs into the lungs every 6 (six) hours as needed for wheezing or shortness of breath.    [provider]  Blood Glucose Monitoring Suppl (ACCU-CHEK AVIVA) device Use as instructed Patient not taking: Reported on 07/11/2019 04/02/19 04/01/20  Allie Bossierove, Myra C, MD  ibuprofen (ADVIL) 800 MG tablet Take 1 tablet (800 mg total) by mouth 3 (three) times daily. Patient not taking: Reported on 07/11/2019 07/05/19   Hermina StaggersErvin, Michael L, MD  metFORMIN (GLUCOPHAGE) 500 MG tablet Take 1 tablet (500  mg total) by mouth 2 (two) times daily with a meal. 07/05/19   Chancy Milroy, MD  omeprazole (PRILOSEC) 20 MG capsule Take 1 capsule (20 mg total) by mouth daily. Patient not taking: Reported on 07/11/2019 04/29/19   Emily Filbert, MD  oxyCODONE-acetaminophen (PERCOCET/ROXICET) 5-325 MG tablet Take 1 tablet by mouth every 4 (four) hours as needed for moderate pain or severe pain. 07/05/19   Chancy Milroy, MD  Prenatal Vit-Fe Fumarate-FA (MULTIVITAMIN-PRENATAL) 27-0.8 MG TABS tablet Take 1 tablet by mouth daily at 12 noon. Patient not taking: Reported on 07/11/2019 02/18/19   Guss Bunde, MD  sertraline (ZOLOFT) 25 MG tablet Take 1 tablet (25 mg  total) by mouth daily. For 7 days followed by 50 mg daily. 07/11/19   Lavonia Drafts, MD  sertraline (ZOLOFT) 50 MG tablet Take 1 tablet (50 mg total) by mouth daily. 07/11/19   Lavonia Drafts, MD  risperiDONE (RISPERDAL) 2 MG tablet Take 1 tablet (2 mg total) by mouth 2 (two) times daily. Patient not taking: Reported on 01/13/2017 11/22/16 01/13/17  Merian Capron, MD    Family History Family History  Problem Relation Age of Onset  . Depression Mother   . Diabetes Mother   . Heart disease Mother   . Stroke Father   . Hypertension Father   . Breast cancer Maternal Aunt        4 mat aunts with breast cancer  . Diabetes Brother   . Breast cancer Maternal Grandmother   . Breast cancer Paternal Grandmother   . Breast cancer Paternal Grandfather     Social History Social History   Tobacco Use  . Smoking status: Former Smoker    Packs/day: 0.50    Years: 15.00    Pack years: 7.50    Types: Cigarettes    Quit date: 03/31/2017    Years since quitting: 2.2  . Smokeless tobacco: Never Used  . Tobacco comment: Quit previously x 1 year, Prior to quittting this time, smoked 0.5-1ppd  Substance Use Topics  . Alcohol use: No  . Drug use: No     Allergies   Penicillins, Hydrocodone, Morphine and related, and Tramadol   Review of Systems Review of Systems  Unable to perform ROS: Acuity of condition     Physical Exam Updated Vital Signs There were no vitals taken for this visit.  Respirations: 0 bpm Pulse: 110 bpm Blood pressure 145/72     Physical Exam Vitals signs and nursing note reviewed.  Constitutional:      Appearance: She is well-developed. She is obese. She is ill-appearing. She is not diaphoretic.  HENT:     Head: Normocephalic and atraumatic.     Mouth/Throat:     Mouth: Mucous membranes are moist. Mucous membranes are cyanotic.     Pharynx: Oropharynx is clear.  Eyes:     Comments: Pupils approximately 2 mm and equal bilaterally.  Neck:      Musculoskeletal: Neck supple.  Cardiovascular:     Rate and Rhythm: Regular rhythm. Tachycardia present.     Pulses: Normal pulses.          Carotid pulses are 2+ on the right side and 2+ on the left side.      Radial pulses are 2+ on the right side and 2+ on the left side.       Posterior tibial pulses are 2+ on the right side and 2+ on the left side.     Comments: Tactile temperature in  the extremities appropriate and equal bilaterally. Pulmonary:     Comments: Apneic Abdominal:     General: There is distension.  Lymphadenopathy:     Cervical: No cervical adenopathy.  Skin:    General: Skin is warm and dry.     Coloration: Skin is pale.  Psychiatric:        Mood and Affect: Mood and affect normal.        Speech: Speech normal.        Behavior: Behavior normal.      ED Treatments / Results  Labs (all labs ordered are listed, but only abnormal results are displayed) Labs Reviewed - No data to display  EKG None  Radiology No results found.  Procedures Oral airway  Date/Time: 07/14/2019 8:18 AM Performed by: Anselm Pancoast, PA-C Authorized by: Anselm Pancoast, PA-C  Consent: The procedure was performed in an emergent situation. Local anesthesia used: no  Anesthesia: Local anesthesia used: no  Sedation: Patient sedated: no  Patient tolerance: patient tolerated the procedure well with no immediate complications Comments: Oral pharyngeal airway placed without gag reflex noted.    (including critical care time)  Medications Ordered in ED Medications  naloxone (NARCAN) 2 MG/2ML injection (has no administration in time range)  ondansetron (ZOFRAN) 4 MG/2ML injection (has no administration in time range)     Initial Impression / Assessment and Plan / ED Course  I have reviewed the triage vital signs and the nursing notes.  Pertinent labs & imaging results that were available during my care of the patient were reviewed by me and considered in my medical decision  making (see chart for details).  Clinical Course as of Jul 14 851  Wynelle Link Jul 14, 2019  0820 An IV was able to be established and Narcan was was administered.  Patient responded to this swiftly.  Regained consciousness and spontaneous respirations resumed.  Patient sat up and vomited.  She was able to maintain her own airway.   [SJ]  0830 Care of patient transferred to Dr. Rush Landmark.    [SJ]    Clinical Course User Index [SJ] Nelson Julson C, PA-C        I was available nearby when this patient presented through the front door. Patient was apneic with perioral cyanosis but with pulses intact. While other treatment efforts were being performed, I placed an oropharyngeal airway and I began to assist ventilations on the patient.  CBG 245. Her cyanosis and pallor cleared with use of BVM and we were able to maintain SPO2 at 100%.  After administration of Narcan, patient quickly regained consciousness and spontaneous respirations.   Chart review reveals patient underwent C-section at [redacted]w[redacted]d on August 18 necessitated by breech presentation and preeclampsia with severe features.  Review of the narcotic database shows patient was prescribed 5-325 mg oxycodone-APAP on July 05, 2019.   Final Clinical Impressions(s) / ED Diagnoses   Final diagnoses:  None    ED Discharge Orders    None       Concepcion Living 07/14/19 3748    Tegeler, Canary Brim, MD 07/14/19 [phone]

## 2019-07-14 NOTE — ED Notes (Signed)
CBG 254 

## 2019-07-14 NOTE — ED Notes (Signed)
Paged OBGYN Per Dr. Sherry Ruffing

## 2019-07-15 ENCOUNTER — Encounter (HOSPITAL_COMMUNITY): Payer: Self-pay

## 2019-07-15 ENCOUNTER — Other Ambulatory Visit: Payer: Self-pay

## 2019-07-15 ENCOUNTER — Encounter (HOSPITAL_COMMUNITY)
Admission: AD | Disposition: A | Discharge status: Home / Self Care | Payer: Self-pay | Source: Home / Self Care | Attending: Family Medicine

## 2019-07-15 LAB — PATHOLOGIST SMEAR REVIEW

## 2019-07-15 LAB — GLUCOSE, CAPILLARY
Glucose-Capillary: 254 mg/dL — ABNORMAL HIGH (ref 70–99)
Glucose-Capillary: 100 mg/dL — ABNORMAL HIGH (ref 70–99)
Glucose-Capillary: 122 mg/dL — ABNORMAL HIGH (ref 70–99)
Glucose-Capillary: 128 mg/dL — ABNORMAL HIGH (ref 70–99)

## 2019-07-15 SURGERY — Surgical Case
Anesthesia: Regional

## 2019-07-15 NOTE — Progress Notes (Signed)
Subjective:feels well  Postpartum Day 12: Cesarean Delivery Patient reports tolerating PO and no problems voiding.    Objective: Vital signs in last 24 hours: Temp:  [97.9 F (36.6 C)-98.9 F (37.2 C)] 98.3 F (36.8 C) (08/31 0403) Pulse Rate:  [66-119] 81 (08/31 0403) Resp:  [3-20] 17 (08/31 0700) BP: (108-148)/(60-99) 108/60 (08/31 0403) SpO2:  [91 %-100 %] 96 % (08/31 0403) Weight:  [108.2 kg] 108.2 kg (08/30 1530)  Physical Exam:  General: alert, cooperative and no distress Lochia: appropriate Uterine Fundus: firm Incision: healing well DVT Evaluation: No evidence of DVT seen on physical exam.  Recent Labs    07/14/19 0820  HGB 12.8  HCT 42.9    Assessment/Plan: Status post Cesarean section. Doing well postoperatively. s/p event possible seizure vs. reaction to opioid, positive drug screen   Neurology was paged for consult, SW consult. D/C magnesium after 24 hr  Emeterio Reeve 07/15/2019, 7:57 AM

## 2019-07-15 NOTE — Progress Notes (Signed)
CSW acknowledges consult and will meet with MOB once magnesium is discontinued.   Emary Zalar, LCSW Clinical Social Worker [facility] Cell#: [phone] 

## 2019-07-16 DIAGNOSIS — O9089 Other complications of the puerperium, not elsewhere classified: Secondary | ICD-10-CM

## 2019-07-16 DIAGNOSIS — R030 Elevated blood-pressure reading, without diagnosis of hypertension: Secondary | ICD-10-CM

## 2019-07-16 DIAGNOSIS — R404 Transient alteration of awareness: Secondary | ICD-10-CM

## 2019-07-16 DIAGNOSIS — R4182 Altered mental status, unspecified: Secondary | ICD-10-CM

## 2019-07-16 LAB — GLUCOSE, CAPILLARY
Glucose-Capillary: 96 mg/dL (ref 70–99)
Glucose-Capillary: 101 mg/dL — ABNORMAL HIGH (ref 70–99)

## 2019-07-16 NOTE — Progress Notes (Signed)
CSW spoke with MOB's RN, MOB's magnesium has been discontinued. RN reported that MOB's husband is at bedside and she is just waking up, CSW agreed to meet with MOB this afternoon.   Abundio Miu, Burt Worker [facility] Cell#: [phone]

## 2019-07-16 NOTE — Discharge Instructions (Signed)
Opioid Overdose Opioids are drugs that are often used to treat pain. Opioids include illegal drugs, such as heroin, as well as prescription pain medicines, such as codeine, morphine, hydrocodone, oxycodone, and fentanyl. An opioid overdose happens when you take too much of an opioid. An overdose may be intentional or accidental and can happen with any type of opioid. The effects of an overdose can be mild, dangerous, or even deadly. Opioid overdose is a medical emergency. What are the causes? This condition may be caused by:  Taking too much of an opioid on purpose.  Taking too much of an opioid by accident.  Using two or more substances that contain opioids at the same time.  Taking an opioid with a substance that affects your heart, breathing, or blood pressure. These include alcohol, tranquilizers, sleeping pills, illegal drugs, and some over-the-counter medicines. This condition may also happen due to an error made by:  A health care provider who prescribes a medicine.  The pharmacist who fills the prescription order. What increases the risk? This condition is more likely in:  Children. They may be attracted to colorful pills. Because of a child's small size, even a small amount of a drug can be dangerous.  Older people. They may be taking many different drugs. Older people may have difficulty reading labels or remembering when they last took their medicine. They may also be more sensitive to the effects of opioids.  People with chronic medical conditions, especially heart, liver, kidney, or neurological diseases.  People who take an opioid for a long period of time.  People who use: ? Illegal drugs. IV heroin is especially dangerous. ? Other substances, including alcohol, while using an opioid.  People who have: ? A history of drug or alcohol abuse. ? Certain mental health conditions. ? A history of previous drug overdoses.  People who take opioids that are not prescribed  for them. What are the signs or symptoms? Symptoms of this condition depend on the type of opioid and the amount that was taken. Common symptoms include:  Sleepiness or difficulty waking from sleep.  Decrease in attention.  Confusion.  Slurred speech.  Slowed breathing and a slow pulse (bradycardia).  Nausea and vomiting.  Abnormally small pupils. Signs and symptoms that require emergency treatment include:  Cold, clammy, and pale skin.  Blue lips and fingernails.  Vomiting.  Gurgling sounds in the throat.  A pulse that is very slow or difficult to detect.  Breathing that is very irregular, slow, noisy, or difficult to detect.  Limp body.  Inability to respond to speech or be awakened from sleep (stupor).  Seizures. How is this diagnosed? This condition is diagnosed based on your symptoms and medical history. It is important to tell your health care provider:  About all of the opioids that you took.  When you took the opioids.  Whether you were drinking alcohol or using marijuana, cocaine, or other drugs. Your health care provider will do a physical exam. This exam may include:  Checking and monitoring your heart rate and rhythm, breathing rate, temperature, and blood pressure (vital signs).  Measuring oxygen levels in your blood.  Checking for abnormally small pupils. You may also have blood tests or urine tests. You may have X-rays if you are having severe breathing problems. How is this treated? This condition requires immediate medical treatment and hospitalization. Treatment is given in the hospital intensive care (ICU) setting. Supporting your vital signs and your breathing is the first step in   treating an opioid overdose. Treatment may also include:  Giving salts and minerals (electrolytes) along with fluids through an IV.  Inserting a breathing tube (endotracheal tube) in your airway to help you breathe if you cannot breathe on your own or you are in  danger of not being able to breathe on your own.  Giving oxygen through a small tube under your nose.  Passing a tube through your nose and into your stomach (nasogastric tube, or NG tube) to empty your stomach.  Giving medicines that: ? Increase your blood pressure. ? Relieve nausea and vomiting. ? Relieve abdominal pain and cramping. ? Reverse the effects of the opioid (naloxone).  Monitoring your heart and oxygen levels.  Ongoing counseling and mental health support if you intentionally overdosed or used an illegal drug. Follow these instructions at home:  Medicines  Take over-the-counter and prescription medicines only as told by your health care provider.  Always ask your health care provider about possible side effects and interactions of any new medicine that you start taking.  Keep a list of all the medicines that you take, including over-the-counter medicines. Bring this list with you to all your medical visits. General instructions  Drink enough fluid to keep your urine pale yellow.  Keep all follow-up visits as told by your health care provider. This is important. How is this prevented?  Read the drug inserts that come with your opioid pain medicines.  Take medicines only as told by your health care provider. Do not take more medicine than you are told. Do not take medicines more frequently than you are told.  Do not drink alcohol or take sedatives when taking opioids.  Do not use illegal or recreational drugs, including cocaine, ecstasy, and marijuana.  Do not take opioid medicines that are not prescribed for you.  Store all medicines in safety containers that are out of the reach of children.  Get help if you are struggling with: ? Alcohol or drug use. ? Depression or another mental health problem. ? Thoughts of hurting yourself or another person.  Keep the phone number of your local poison control center near your phone or in your mobile phone. In the  [place] the hotline of the National Poison Control Center is (800) 222-1222.  If you were prescribed naloxone, make sure you understand how to take it. Contact a health care provider if you:  Need help understanding how to take your pain medicines.  Feel your medicines are too strong.  Are concerned that your pain medicines are not working well for your pain.  Develop new symptoms or side effects when you are taking medicines. Get help right away if:  You or someone else is having symptoms of an opioid overdose. Get help even if you are not sure.  You have serious thoughts about hurting yourself or others.  You have: ? Chest pain. ? Difficulty breathing. ? A loss of consciousness. These symptoms may represent a serious problem that is an emergency. Do not wait to see if the symptoms will go away. Get medical help right away. Call your local emergency services (911 in the [place] Do not drive yourself to the hospital. If you ever feel like you may hurt yourself or others, or have thoughts about taking your own life, get help right away. You can go to your nearest emergency department or call:  Your local emergency services (911 in the [place]  A suicide crisis helpline, such as the National Suicide Prevention Lifeline at   1-800-273-8255. This is open 24 hours a day. Summary  Opioids are drugs that are often used to treat pain. Opioids include illegal drugs, such as heroin, as well as prescription pain medicines.  An opioid overdose happens when you take too much of an opioid.  Overdoses can be intentional or accidental.  Opioid overdose is very dangerous. It is a life-threatening emergency.  If you or someone you know is experiencing an opioid overdose, get help right away. This information is not intended to replace advice given to you by your health care provider. Make sure you discuss any questions you have with your health care provider. Document Released: 12/08/2004 Document  Revised: 10/18/2018 Document Reviewed: 10/18/2018 Elsevier Patient Education  2020 [employer]  

## 2019-07-16 NOTE — Progress Notes (Signed)
Teaching complete pt plan to go to nicu  Husband at work

## 2019-07-16 NOTE — Progress Notes (Signed)
CSW met with MOB at bedside to discuss consult for "amphetamine positive", FOB present. CSW asked FOB to leave to speak with MOB privately, FOB left voluntarily. CSW previously met with MOB on 07/05/2019 and reintroduced self, MOB remembered CSW. CSW inquired about how MOB was doing, MOB reported that last week she was "emotionally a mess". CSW asked MOB to elaborate on what that meant to her, MOB reported that she was crying a lot. CSW normalized MOB being emotional and went over baby blues. MOB reported that she would cry for no reason, cry because her baby wasn't around, cry because FOB was at work and cry when thinking about not having her babies all together. CSW again normalized MOB's experience with crying and related it back to baby blues and situational stressors. MOB reported that her stress went down. CSW inquired about MOB's stressors, MOB and CSW spoke at length about MOB's multiple stressors. CSW provided MOB with emotional support and helped MOB process her stressors. 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 MOB reported that she started Zoloft and is ready for it to start working. CSW inquired if MOB was interested in therapy resources, MOB declined. MOB reported that she has an appointment with her psychiatrist on September 4th and doesn't feel like she needs therapy at the moment but will reconsider if she needs it in the future. CSW positively affirmed MOB for being proactive with treating her PPD symptoms and encouraged her to keep the appointment with her psychiatrist.   CSW informed MOB that she tested positive for amphetamines and inquired if she had a prescription. MOB reported no but that her son takes "Methylphendite" for ADHD and that the dog chewed on the bottle and that her mother in law put the pills into the bottle with her heartburn medicine. MOB emphasized that she doesn't do drugs and that she brought all her prescriptions to the hospital for  the doctor to look at. MOB reported "I will admit I over did it with the pain medicine" but I am no longer taking any pain medicine. MOB reiterated that she does not do drugs. CSW acknowledged MOB's statement. CSW did not offer MOB substance use treatment resources because she denied drug use.   MOB reported that being at the hospital has been helpful having alone time and getting some sleep. CSW inquired about how MOB was feeling emotionally today, MOB reported that she feels "good". MOB reported that she is happy about going up stairs to spend time with her daughter in the NICU. MOB reported that she was also happy to see FOB today. CSW assessed for safety, MOB denied SI, HI and domestic violence. 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 CSW inquired if MOB had any needs/concerns, MOB reported none. CSW encouraged MOB to contact CSW if any needs arise or if she simply needs support.   CSW will continue to offer support/resources while infant is admitted to the NICU.  Anna Knieriem, LCSW Clinical Social Worker [facility] Cell#: [phone] 

## 2019-07-16 NOTE — Discharge Summary (Signed)
Physician Discharge Summary  Patient ID: Anna HitchBrandi Jordan MRN: [id number] DOB/AGE: 07-14-1991 28 y.o.  Admit date: 07/14/2019 Discharge date: 07/16/2019   Discharge Diagnoses:  Active Problems:   Altered mental status   Eclampsia, postpartum   Consults: None Neurology called and never came to see patient.  Significant Diagnostic Studies:  CBC    Component Value Date/Time   WBC 17.3 (H) 07/14/2019 0820   RBC 4.59 07/14/2019 0820   HGB 12.8 07/14/2019 0820   HCT 42.9 07/14/2019 0820   PLT 583 (H) 07/14/2019 0820   MCV 93.5 07/14/2019 0820   MCH 27.9 07/14/2019 0820   MCHC 29.8 (L) 07/14/2019 0820   RDW 15.4 07/14/2019 0820   LYMPHSABS 9.7 (H) 07/14/2019 0820   MONOABS 0.8 07/14/2019 0820   EOSABS 0.2 07/14/2019 0820   BASOSABS 0.1 07/14/2019 0820   CMP     Component Value Date/Time   NA 140 07/14/2019 0820   K 3.7 07/14/2019 0820   CL 103 07/14/2019 0820   CO2 21 (L) 07/14/2019 0820   GLUCOSE 256 (H) 07/14/2019 0820   BUN 10 07/14/2019 0820   CREATININE 0.94 07/14/2019 0820   CREATININE 0.59 02/19/2019 0822   CALCIUM 9.2 07/14/2019 0820   PROT 6.7 07/14/2019 0820   ALBUMIN 3.4 (L) 07/14/2019 0820   AST 34 07/14/2019 0820   ALT 35 07/14/2019 0820   ALKPHOS 95 07/14/2019 0820   BILITOT 0.3 07/14/2019 0820   GFRNONAA >60 07/14/2019 0820   GFRNONAA >89 03/14/2017 1514   GFRAA >60 07/14/2019 0820   GFRAA >89 03/14/2017 1514   Lab Results  Component Value Date   LABOPIA POSITIVE (A) 07/14/2019   Lab Results  Component Value Date   AMPHETMU POSITIVE (A) 07/14/2019     Dg Chest Portable 1 View  Result Date: 07/14/2019 CLINICAL DATA:  28 year old female found unresponsive in her car. History of seizure activity. EXAM: PORTABLE CHEST 1 VIEW COMPARISON:  None. FINDINGS: The lungs are clear and negative for focal airspace consolidation, pulmonary edema or suspicious pulmonary nodule. No pleural effusion or pneumothorax. Cardiac and mediastinal contours are within  normal limits. No acute fracture or lytic or blastic osseous lesions. The visualized upper abdominal bowel gas pattern is unremarkable. IMPRESSION: Negative chest x-ray. Electronically Signed   By: Malachy MoanHeath  McCullough M.D.   On: 07/14/2019 09:37     Hospital Course: patient took 2 percocet and then became apneic and hypoxic. Revived in ED with Narcan. ? Postictal, though no seizure activity seen, one elevated BP, so treated presumptively as eclampsia, though this is not confirmed. She received Magnesium x 24 hours. No further BP elevations. Labs WNL. Including CBGs, monitored daily. UDS + for opiates and amphetamines. CSW asked to see patient prior to discharge. No further episodes of hypoxia or apnea while hospitalized.    Disposition: Discharge disposition: 01-Home or Self Care       Discharged Condition: good  Discharge Instructions     Remove dressing in 72 hours   Complete by: As directed    Activity as tolerated   Complete by: As directed    Call MD for:  persistant nausea and vomiting   Complete by: As directed    Call MD for:  redness, tenderness, or signs of infection (pain, swelling, redness, odor or green/yellow discharge around incision site)   Complete by: As directed    Call MD for:  severe uncontrolled pain   Complete by: As directed    Call MD for:  temperature >100.4  Complete by: As directed    Diet - low sodium heart healthy   Complete by: As directed    Driving restriction   Complete by: As directed    Avoid driving while taking narcotic pain meds.   Lifting restrictions   Complete by: As directed    Weight restriction of 20 lbs x 6 weeks   Sexual acrtivity   Complete by: As directed    None x 6 weeks     Allergies as of 07/16/2019      Reactions   Penicillins Swelling   Did it involve swelling of the face/tongue/throat, SOB, or low BP? Yes Did it involve sudden or severe rash/hives, skin peeling, or any reaction on the inside of your mouth or nose?  No Did you need to seek medical attention at a hospital or doctor's office? Yes When did it last happen?Childhood If all above answers are "NO", may proceed with cephalosporin use.   Hydrocodone Hives   Morphine And Related Hives   Tramadol Hives      Medication List    STOP taking these medications   omeprazole 20 MG capsule Commonly known as: PriLOSEC   oxyCODONE-acetaminophen 5-325 MG tablet Commonly known as: PERCOCET/ROXICET     TAKE these medications   Accu-Chek Aviva device Use as instructed   Accu-Chek FastClix Lancets Misc 1 Device by Percutaneous route 4 (four) times daily.   albuterol 108 (90 Base) MCG/ACT inhaler Commonly known as: VENTOLIN HFA Inhale 2 puffs into the lungs every 6 (six) hours as needed for wheezing or shortness of breath.   ibuprofen 800 MG tablet Commonly known as: ADVIL Take 1 tablet (800 mg total) by mouth 3 (three) times daily.   metFORMIN 500 MG tablet Commonly known as: GLUCOPHAGE Take 1 tablet (500 mg total) by mouth 2 (two) times daily with a meal.   multivitamin-prenatal 27-0.8 MG Tabs tablet Take 1 tablet by mouth daily at 12 noon.   sertraline 25 MG tablet Commonly known as: Zoloft Take 1 tablet (25 mg total) by mouth daily. For 7 days followed by 50 mg daily.   sertraline 50 MG tablet Commonly known as: Zoloft Take 1 tablet (50 mg total) by mouth daily.      Follow-up [place] [facility] [place] Follow up.   Specialty: Obstetrics and Gynecology Contact information: [address] [place], [place] [phone]          Signed: [name] 07/16/2019, 10:53 AM

## 2019-07-16 NOTE — Progress Notes (Signed)
CSW acknowledges consult and completed clinical assessment.  Clinical documentation will follow.  There are no barriers to d/c.  Sherryl Valido, LCSW Clinical Social Worker [facility] Cell#: [phone]  

## 2019-07-19 ENCOUNTER — Ambulatory Visit (INDEPENDENT_AMBULATORY_CARE_PROVIDER_SITE_OTHER): Payer: Medicaid Other | Admitting: Psychiatry

## 2019-07-19 ENCOUNTER — Encounter (HOSPITAL_COMMUNITY): Payer: Self-pay | Admitting: Psychiatry

## 2019-07-19 DIAGNOSIS — F331 Major depressive disorder, recurrent, moderate: Secondary | ICD-10-CM | POA: Diagnosis not present

## 2019-07-19 DIAGNOSIS — F200 Paranoid schizophrenia: Secondary | ICD-10-CM | POA: Diagnosis not present

## 2019-07-19 DIAGNOSIS — F411 Generalized anxiety disorder: Secondary | ICD-10-CM

## 2019-07-19 NOTE — Progress Notes (Signed)
[facility] Outpatient Follow up visit  Patient Identification: Anna Jordan MRN:  [id number] Date of Evaluation:  07/19/2019 Referral Source: Self / Family Chief Complaint:   Follow up   Visit Diagnosis:    ICD-10-CM   1. MDD (major depressive disorder), recurrent episode, moderate (HCC)  F33.1   2. GAD (generalized anxiety disorder)  F41.1   3. Paranoid schizophrenia ([place])  F20.0    GAD Insomnia  I connected with Anna Jordan on 07/19/19 at 12:00 PM EDT by a video enabled telemedicine application and verified that I am speaking with the correct person using two identifiers.   I discussed the limitations of evaluation and management by telemedicine and the availability of in person appointments. The patient expressed understanding and agreed to proceed.  History of Present Illness:  28  years old currently single Caucasian female who is living with her boyfriend is here for follow up. Comes back after 14 months. Now pos partum 1 year  Last visit has been last year and she did not follow after, was on lexapro and prior to that she was postpartum and had paranoia that went away . She was diagnosed with ptsd for past trauma and some contribution of paranoia and worries related to exacerbated effect of past trauma  Says she did well on lexapro and then stopped when she got pregnant again Did ok while pregnant and now postpartum 2 weeks  She is feeling down, depressed. Baby was 74 w and is in NIcu  Having feeding tube She worries about the baby and feeling sad, depressed . zoloft was started 25mg  by primary care and has helped some but still feels down. Not paranoid or suicidal Has good support from her BF and his family   Denies nightmares  There is history of miscarriage.   Modifying factor : boyfriend  Aggravating factor: crowds. Miscarriage 2017. Recent birth  At 55 w Baby in [place]   Previous Psychotropic Medications: Yes     Past Medical History:  Past Medical History:   Diagnosis Date  . Anxiety   . Class 3 severe obesity due to excess calories with body mass index (BMI) of 50.0 to 59.9 in adult ([place]) 07/18/2018  . Depression   . Gestational diabetes   . Hypertension   . Obesity   . Schizophrenia [facility])     Past Surgical History:  Procedure Laterality Date  . CESAREAN SECTION N/A 07/02/2019   Procedure: CESAREAN SECTION;  Surgeon: Truett Mainland, DO;  Location: [place] [facility];  Service: Obstetrics;  Laterality: N/A;  . GALLBLADDER SURGERY  08/2018  . TUBAL LIGATION    . WISDOM TOOTH EXTRACTION        Family History:  Family History  Problem Relation Age of Onset  . Depression Mother   . Diabetes Mother   . Heart disease Mother   . Stroke Father   . Hypertension Father   . Breast cancer Maternal Aunt        4 mat aunts with breast cancer  . Diabetes Brother   . Breast cancer Maternal Grandmother   . Breast cancer Paternal Grandmother   . Breast cancer Paternal Grandfather     Social History:   Social History   Socioeconomic History  . Marital status: Married    Spouse name: Not on file  . Number of children: Not on file  . Years of education: Not on file  . Highest education level: Not on file  Occupational History  . Occupation: homemaker  Social Needs  . Financial resource strain: Not on file  . Food insecurity    Worry: Not on file    Inability: Not on file  . Transportation needs    Medical: Not on file    Non-medical: Not on file  Tobacco Use  . Smoking status: Current Some Day Smoker    Packs/day: 5.00    Years: 15.00    Pack years: 75.00    Types: Cigarettes  . Smokeless tobacco: Never Used  . Tobacco comment: Quit previously x 1 year, Prior to quitting this time, smoked 0.5-1ppd  Substance and Sexual Activity  . Alcohol use: No  . Drug use: No  . Sexual activity: Not Currently    Partners: Male    Birth control/protection: None  Lifestyle  . Physical activity    Days per week: Not on file    Minutes per  session: Not on file  . Stress: Not on file  Relationships  . Social Musicianconnections    Talks on phone: Not on file    Gets together: Not on file    Attends religious service: Not on file    Active member of club or organization: Not on file    Attends meetings of clubs or organizations: Not on file    Relationship status: Not on file  Other Topics Concern  . Not on file  Social History Narrative  . Not on file     Allergies:   Allergies  Allergen Reactions  . Penicillins Swelling    Did it involve swelling of the face/tongue/throat, SOB, or low BP? Yes Did it involve sudden or severe rash/hives, skin peeling, or any reaction on the inside of your mouth or nose? No Did you need to seek medical attention at a hospital or doctor's office? Yes When did it last happen?Childhood If all above answers are "NO", may proceed with cephalosporin use.   Marland Kitchen. Hydrocodone Hives  . Morphine And Related Hives  . Tramadol Hives    Metabolic Disorder Labs: Lab Results  Component Value Date   HGBA1C 8.0 (H) 06/21/2019   MPG 182.9 06/21/2019   MPG 160 01/15/2019   Lab Results  Component Value Date   PROLACTIN 35.1 (H) 10/26/2016   No results found for: CHOL, TRIG, HDL, CHOLHDL, VLDL, LDLCALC   Current Medications: Current Outpatient Medications  Medication Sig Dispense Refill  . Accu-Chek FastClix Lancets MISC 1 Device by Percutaneous route 4 (four) times daily. (Patient not taking: Reported on 07/11/2019) 100 each 12  . albuterol (PROVENTIL HFA;VENTOLIN HFA) 108 (90 Base) MCG/ACT inhaler Inhale 2 puffs into the lungs every 6 (six) hours as needed for wheezing or shortness of breath.    . Blood Glucose Monitoring Suppl (ACCU-CHEK AVIVA) device Use as instructed (Patient not taking: Reported on 07/11/2019) 1 each 0  . ibuprofen (ADVIL) 800 MG tablet Take 1 tablet (800 mg total) by mouth 3 (three) times daily. 30 tablet 1  . metFORMIN (GLUCOPHAGE) 500 MG tablet Take 1 tablet (500 mg  total) by mouth 2 (two) times daily with a meal. (Patient not taking: Reported on 07/14/2019) 60 tablet 1  . Prenatal Vit-Fe Fumarate-FA (MULTIVITAMIN-PRENATAL) 27-0.8 MG TABS tablet Take 1 tablet by mouth daily at 12 noon. (Patient not taking: Reported on 07/11/2019) 30 tablet 10  . sertraline (ZOLOFT) 25 MG tablet Take 1 tablet (25 mg total) by mouth daily. For 7 days followed by 50 mg daily. 7 tablet 0  . sertraline (ZOLOFT) 50 MG tablet Take  1 tablet (50 mg total) by mouth daily. 30 tablet 3   No current facility-administered medications for this visit.       Psychiatric Specialty Exam: Review of Systems  Respiratory: Negative for cough.   Cardiovascular: Negative for chest pain.  Skin: Negative for rash.  Psychiatric/Behavioral: Positive for depression. Negative for substance abuse and suicidal ideas.    currently breastfeeding.There is no height or weight on file to calculate BMI.  General Appearance: Casual  Eye Contact: fair  Speech:  Slow  Volume:  Decreased  Mood: subdued  Affect:constricted  Thought Process:  Relavant. Not hallucinating  Orientation:  Full (Time, Place, and Person)  Thought Content:  Intact and not loose  Suicidal Thoughts:  No  Homicidal Thoughts:  No  Memory:  Immediate;   Fair Recent;   Fair  Judgement:  Poor  Insight:  Shallow  Psychomotor Activity:  Decreased  Concentration:  Concentration: Fair and Attention Span: Fair  Recall:  [employer] of Knowledge:Fair  Language: Fair  Akathisia:  Negative  Handed:  Right  AIMS (if indicated):    Assets:  Boy friend. support  ADL's:  Intact  Cognition: WNL  Sleep:  variable    Treatment Plan Summary: Medication management and Plan as follows   1. Schizophrenia: not paranoid.not hallucinating. Not presenting with symptoms of paranoia.  Possible flashbacks and paranoia related to PTSD as well and feels anxious around crowds Has not been on anti pyschotic for a while and no recurrence of  paranoia  2. MDD postpartum : is on zoloft 25mg  some improvement. Increase to 50mg  today. Reviewed side effects and med may take time, consider therapy  Call back for concerns or any worsening of sympotm Not delusional or having toughts of self harm or to baby   GAd: endorses anxiety, increase zoloft as above    Insomnia: reviewed sleep hygiene Advised regular visits as per for her postpartum and compliance, she tend not to return for fu  Will follow up in 1-2 week. Call back for concerns  I discussed the assessment and treatment plan with the patient. The patient was provided an opportunity to ask questions and all were answered. The patient agreed with the plan and demonstrated an understanding of the instructions.   The patient was advised to call back or seek an in-person evaluation if the symptoms worsen or if the condition fails to improve as anticipated.   Thresa Ross, MD 9/4/202012:19 PM

## 2019-07-29 ENCOUNTER — Other Ambulatory Visit: Payer: Self-pay

## 2019-07-29 ENCOUNTER — Telehealth: Payer: Self-pay | Admitting: *Deleted

## 2019-07-29 ENCOUNTER — Telehealth (INDEPENDENT_AMBULATORY_CARE_PROVIDER_SITE_OTHER): Payer: Medicaid Other | Admitting: Obstetrics & Gynecology

## 2019-07-29 DIAGNOSIS — O99345 Other mental disorders complicating the puerperium: Secondary | ICD-10-CM

## 2019-07-29 DIAGNOSIS — F53 Postpartum depression: Secondary | ICD-10-CM

## 2019-07-29 NOTE — Telephone Encounter (Signed)
Called patient on behalf of Dr. Hulan Fray. Patient advised not to pluck ingrown hairs at incision site. They need to be left alone or looked at if it becomes infected. Patient agreed.

## 2019-07-29 NOTE — Progress Notes (Signed)
   Subjective:    Patient ID: Anna Jordan, female    DOB: 1990-12-23, 28 y.o.   MRN: [id number]  HPI  28 yo married P3 here for a virtual visit for followup of PP blues. She started zoloft 50 mg about 2 weeks ago. She feels like it is helping. Her daughter is home and is about 4 weeks. She was in the NICU for 3 weeks.She is bottle feeding. She had sex and had a BTL at the time of cesarean section.   She reports that her incision has healed well.  She still checks her sugars- 4 times per day. She is taking metformin 500 mg BID. fastings are between 60-80. The remainder of her sugars are all less than 127.  Review of Systems     Objective:   Physical Exam She looks normal via virtual visit.     Assessment & Plan:  Rec that she stop the metformin. Plan on doing another 2 hour GTT in 4 weeks PP at that time

## 2019-07-29 NOTE — Progress Notes (Signed)
Pt is on virtual visit for FU PPD, currently taking Zoloft 50mg  daily. EPDS score:0

## 2019-07-31 ENCOUNTER — Encounter: Payer: Self-pay | Admitting: *Deleted

## 2019-08-02 ENCOUNTER — Other Ambulatory Visit: Payer: Self-pay

## 2019-08-02 ENCOUNTER — Ambulatory Visit (HOSPITAL_COMMUNITY): Payer: Medicaid Other | Admitting: Psychiatry

## 2019-08-05 ENCOUNTER — Ambulatory Visit: Payer: Medicaid Other | Admitting: Obstetrics & Gynecology

## 2019-08-26 ENCOUNTER — Encounter: Payer: Medicaid Other | Admitting: Obstetrics & Gynecology

## 2019-08-26 ENCOUNTER — Telehealth: Payer: Self-pay | Admitting: *Deleted

## 2019-08-26 NOTE — Progress Notes (Signed)
No show. Opened in error

## 2019-08-26 NOTE — Telephone Encounter (Signed)
Not able to leave patient a message about missed Postpartum appointment on 08/26/2019 at 3:45pm. Phone rung several times and then a busy signal.

## 2019-08-27 NOTE — Progress Notes (Signed)
This encounter was created in error - please disregard.

## 2020-01-14 ENCOUNTER — Ambulatory Visit (HOSPITAL_COMMUNITY): Payer: Medicaid Other | Admitting: Psychiatry

## 2020-01-14 ENCOUNTER — Other Ambulatory Visit: Payer: Self-pay

## 2020-02-13 DEATH — deceased

## 2020-04-29 IMAGING — US US MFM OB FOLLOW UP
1 series · 13 of 28 positions shown · non-contrast
Comparison: none

[Series 1: us mfm ob follow up · 44 acquisitions, 13 frames shown]
[im 2/44]
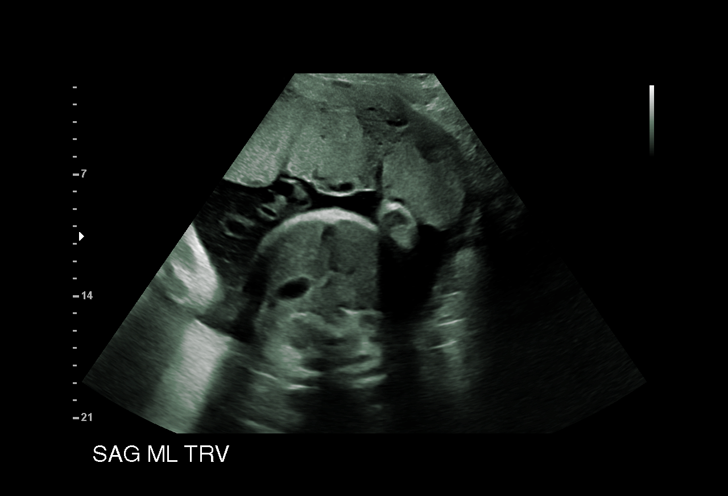
[im 5/44]
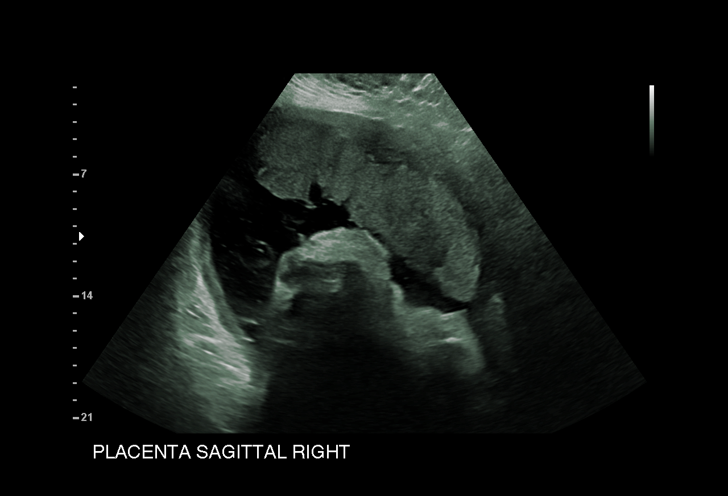
[im 8/44]
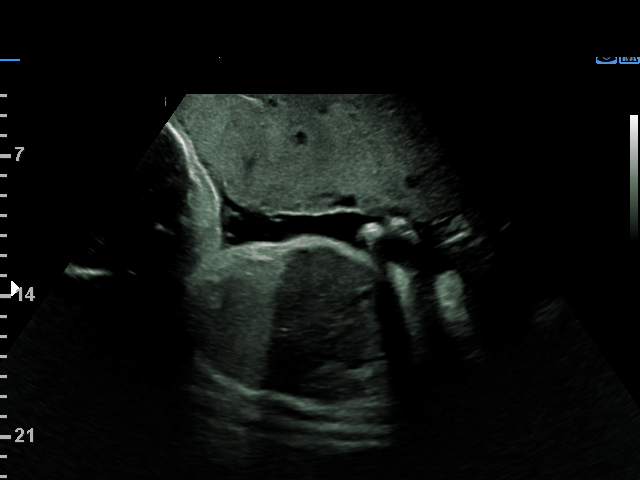
[im 12/44]
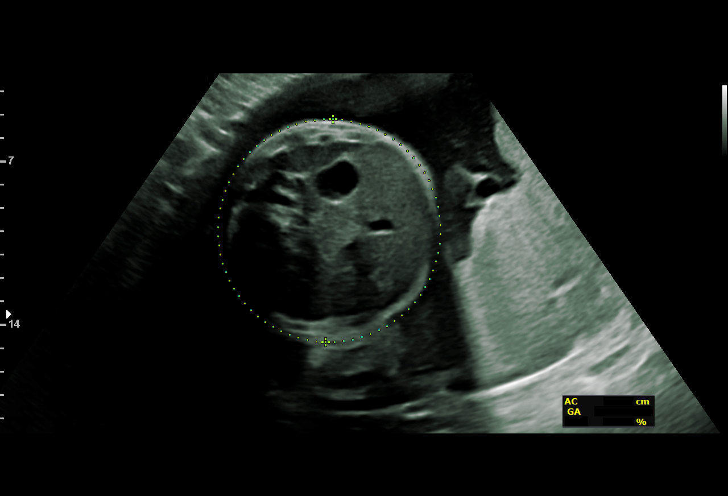
[im 15/44]
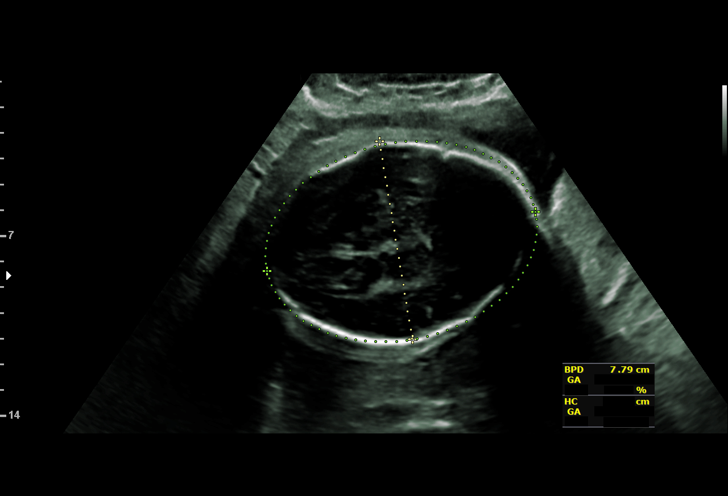
[im 18/44]
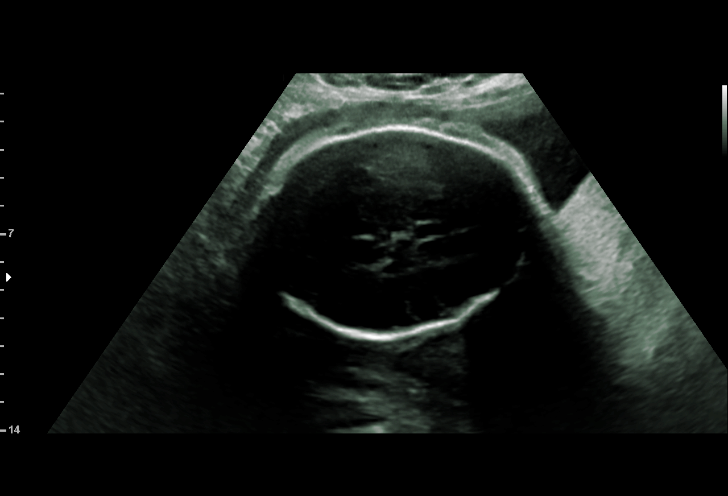
[im 23/44]
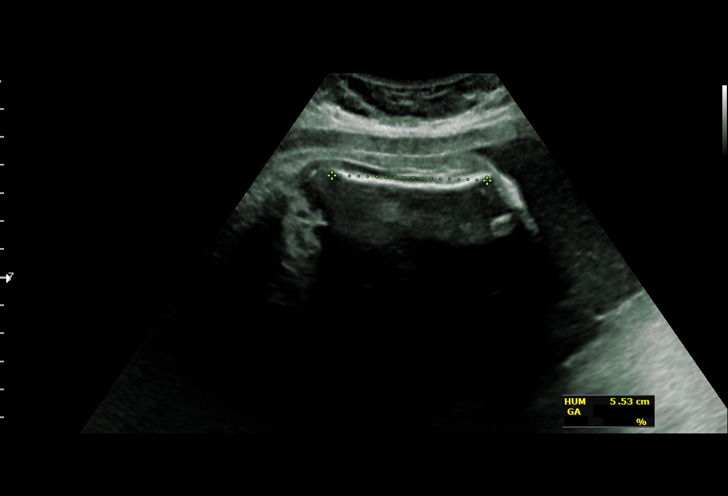
[im 26/44]
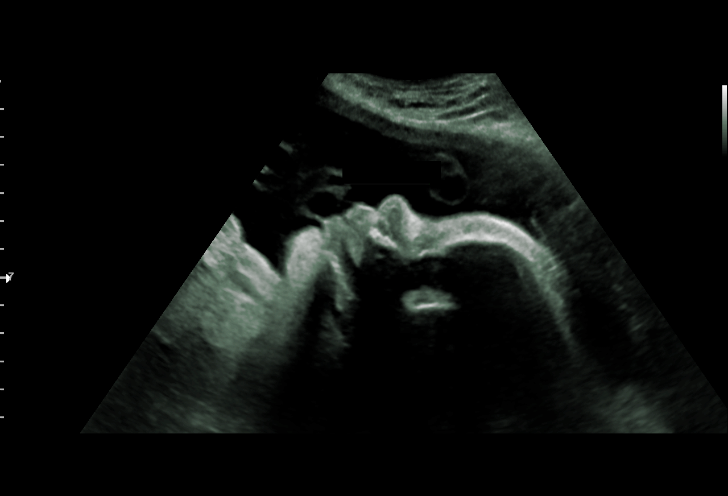
[im 29/44]
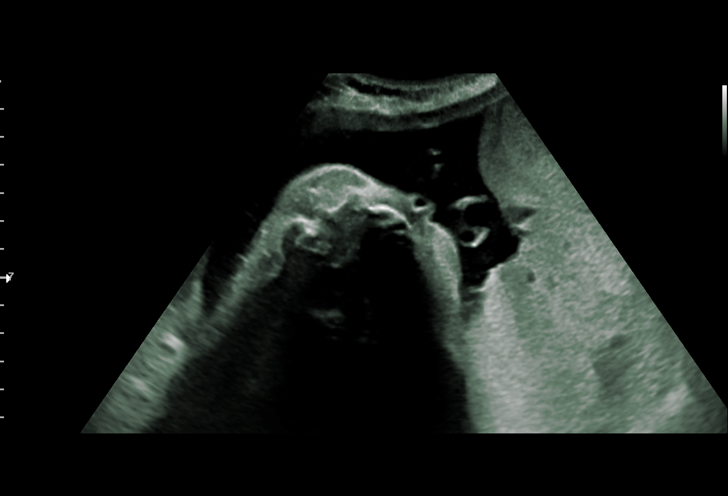
[im 32/44]
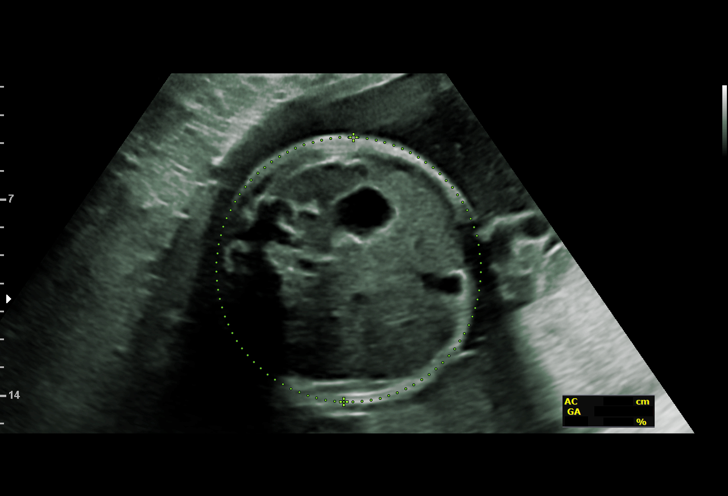
[im 36/44]
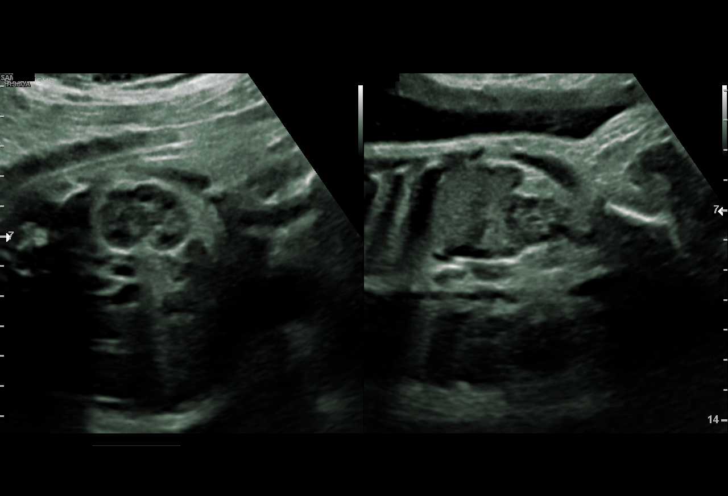
[im 39/44]
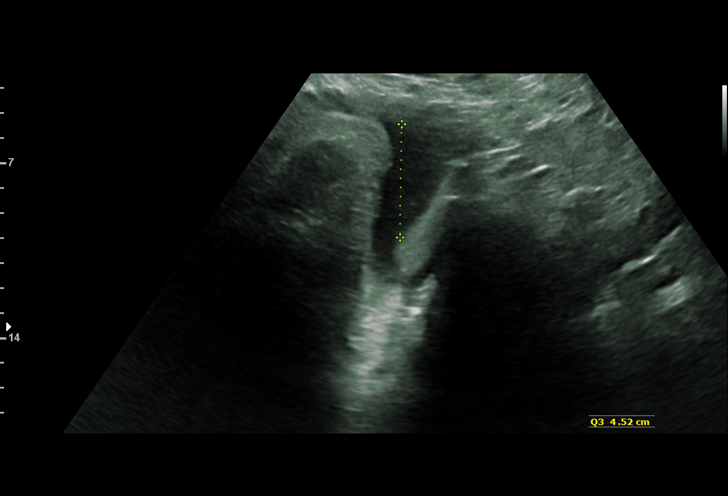
[im 42/44]
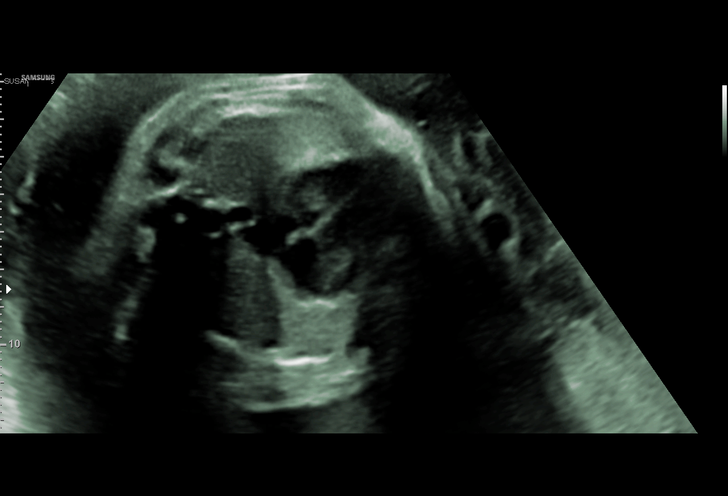

[13 of 28 positions shown; findings below may reference images not displayed]

[facility]MAN
 ----------------------------------------------------------------------

 ----------------------------------------------------------------------
Indications

  Encounter for other antenatal screening
  follow-up
  32 weeks gestation of pregnancy
  Obesity complicating pregnancy, third
  trimester (pregravid BMI 50)
  Poor obstetric history: Previous preterm
  delivery, antepartum  for pre-eclapsia
  Poor obstetric history: Previous
  preeclampsia / eclampsia/gestational HTN
  Pre-existing diabetes, type 1, in pregnancy,
  third trimester
  Other mental disorder complicating
  pregnancy, third trimester , schizophrenia,
  anxiety, depression.
  Previously LGA.
  Maternity 21 - normal
 ----------------------------------------------------------------------
Vital Signs

                                                Height:        5'2"
Fetal Evaluation

 Num Of Fetuses:          1
 Fetal Heart Rate(bpm):   150
 Cardiac Activity:        Observed
 Presentation:            Transverse, head to maternal right
 Placenta:                Anterior
 Amniotic Fluid
 AFI FV:      Within normal limits

 AFI Sum(cm)     %Tile       Largest Pocket(cm)
 19.21           72

 RUQ(cm)       RLQ(cm)       LUQ(cm)        LLQ(cm)

Biophysical Evaluation

 Amniotic F.V:   Within normal limits       F. Tone:         Observed
 F. Movement:    Observed                   Score:           [DATE]
 F. Breathing:   Observed
Biometry

 BPD:      77.9  mm     G. Age:  31w 2d         15  %    CI:          70.7  %    70 - 86
                                                         FL/HC:       20.8  %    19.1 -
 HC:      295.3  mm     G. Age:  32w 4d         22  %    HC/AC:       0.99       0.96 -
 AC:      296.9  mm     G. Age:  33w 5d         85  %    FL/BPD:      78.9  %    71 - 87
 FL:       61.5  mm     G. Age:  31w 6d         27  %    FL/AC:       20.7  %    20 - 24
 HUM:      54.6  mm     G. Age:  31w 5d         42  %
 LV:        6.7  mm

 Est. FW:    8262   gm     4 lb 9 oz     57  %
OB History

 Gravidity:    5         Term:   1        Prem:   1        SAB:   2
 Living:       2
Gestational Age

 LMP:           32w 2d        Date:  10/29/18                 EDD:   08/05/19
 U/S Today:     32w 3d                                        EDD:   08/04/19
 Best:          32w 2d     Det. By:  LMP  (10/29/18)          EDD:   08/05/19
Anatomy

 Cranium:               Appears normal         Aortic Arch:            Previously seen
 Cavum:                 Appears normal         Ductal Arch:            Not well visualized
 Ventricles:            Appears normal         Diaphragm:              Appears normal
 Choroid Plexus:        Appears normal         Stomach:                Appears normal, left
                                                                       sided
 Cerebellum:            Previously seen        Abdomen:                Appears normal
 Posterior Fossa:       Previously seen        Abdominal Wall:         Appears nml (cord
                                                                       insert, abd wall)
 Nuchal Fold:           Not applicable (>20    Cord Vessels:           Appears normal (3
                        wks GA)                                        vessel cord)
 Face:                  Appears normal         Kidneys:                Appear normal
                        (orbits and profile)
 Lips:                  Appears normal         Bladder:                Appears normal
 Thoracic:              Appears normal         Spine:                  Previously seen
 Heart:                 Appears normal         Upper Extremities:      Previously seen
                        (4CH, axis, and
                        situs)
 RVOT:                  Previously seen        Lower Extremities:      Previously seen
 LVOT:                  Previously seen

 Other:  Heels and 5th digit previously seen. Technically difficult due to
         maternal habitus.
Cervix Uterus Adnexa

 Cervix
 Not visualized (advanced GA >68wks)
Impression

 Normal interval growth.
 Biophysical profile [DATE]
 T1 DM
 Low risk NIPS
Recommendations

 Repeat growth in 4 weeks
 Initiate weekly BPP

## 2020-05-31 IMAGING — DX PORTABLE CHEST - 1 VIEW
1 series · 1 of 1 positions shown · non-contrast
Comparison: None.

CLINICAL DATA: 28-year-old female found unresponsive in her car.
History of seizure activity.

EXAM:
PORTABLE CHEST 1 VIEW

[chest]
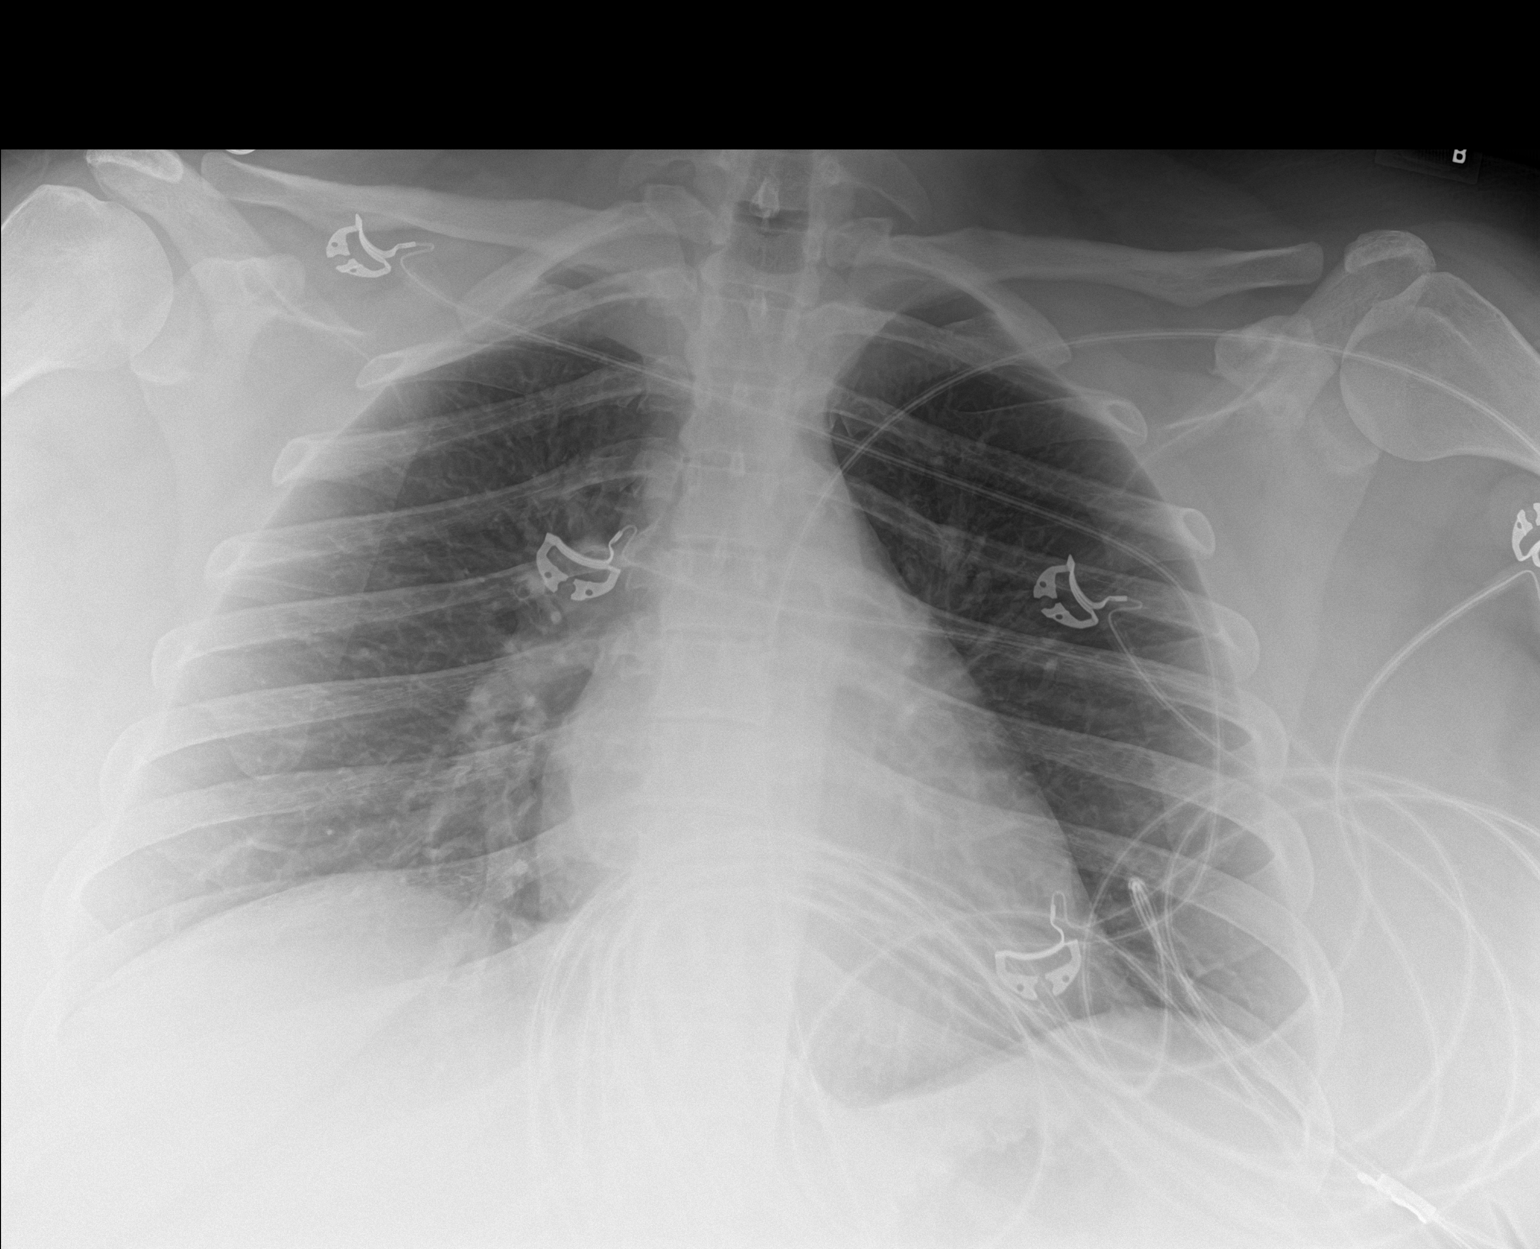

[1 of 1 positions shown; findings below may reference images not displayed]

FINDINGS: The lungs are clear and negative for focal airspace consolidation,
pulmonary edema or suspicious pulmonary nodule. No pleural effusion
or pneumothorax. Cardiac and mediastinal contours are within normal
limits. No acute fracture or lytic or blastic osseous lesions. The
visualized upper abdominal bowel gas pattern is unremarkable.
IMPRESSION: Negative chest x-ray.
